# Patient Record
Sex: Female | Born: 1960 | Race: Black or African American | Hispanic: No | Marital: Single | Smoking: Never smoker
Health system: Southern US, Community
[De-identification: ages and names within clinical notes are randomized; demographics above are authoritative.]

## PROBLEM LIST (undated history)

## (undated) DIAGNOSIS — G4730 Sleep apnea, unspecified: Secondary | ICD-10-CM

## (undated) DIAGNOSIS — I10 Essential (primary) hypertension: Secondary | ICD-10-CM

## (undated) DIAGNOSIS — I739 Peripheral vascular disease, unspecified: Secondary | ICD-10-CM

## (undated) DIAGNOSIS — E785 Hyperlipidemia, unspecified: Secondary | ICD-10-CM

## (undated) DIAGNOSIS — T7840XA Allergy, unspecified, initial encounter: Secondary | ICD-10-CM

## (undated) DIAGNOSIS — I2510 Atherosclerotic heart disease of native coronary artery without angina pectoris: Secondary | ICD-10-CM

## (undated) DIAGNOSIS — H409 Unspecified glaucoma: Secondary | ICD-10-CM

## (undated) DIAGNOSIS — H269 Unspecified cataract: Secondary | ICD-10-CM

## (undated) DIAGNOSIS — E119 Type 2 diabetes mellitus without complications: Secondary | ICD-10-CM

## (undated) HISTORY — DX: Atherosclerotic heart disease of native coronary artery without angina pectoris: I25.10

## (undated) HISTORY — DX: Unspecified glaucoma: H40.9

## (undated) HISTORY — PX: CARDIAC CATHETERIZATION: SHX172

## (undated) HISTORY — DX: Hyperlipidemia, unspecified: E78.5

## (undated) HISTORY — DX: Allergy, unspecified, initial encounter: T78.40XA

## (undated) HISTORY — PX: CATARACT EXTRACTION: SUR2

## (undated) HISTORY — DX: Sleep apnea, unspecified: G47.30

## (undated) HISTORY — PX: HAND TENDON SURGERY: SHX663

## (undated) HISTORY — DX: Unspecified cataract: H26.9

## (undated) HISTORY — DX: Peripheral vascular disease, unspecified: I73.9

---

## 2014-02-21 ENCOUNTER — Encounter (HOSPITAL_COMMUNITY): Payer: Self-pay | Admitting: Emergency Medicine

## 2014-02-21 ENCOUNTER — Emergency Department (HOSPITAL_COMMUNITY)
Admission: EM | Admit: 2014-02-21 | Discharge: 2014-02-21 | Disposition: A | Discharge status: Home / Self Care | Payer: Federal, State, Local not specified - PPO | Source: Home / Self Care | Attending: Family Medicine | Admitting: Family Medicine

## 2014-02-21 DIAGNOSIS — J0120 Acute ethmoidal sinusitis, unspecified: Secondary | ICD-10-CM

## 2014-02-21 HISTORY — DX: Essential (primary) hypertension: I10

## 2014-02-21 HISTORY — DX: Type 2 diabetes mellitus without complications: E11.9

## 2014-02-21 MED ORDER — CLINDAMYCIN HCL 300 MG PO CAPS: 300.0000 mg | ORAL_CAPSULE | Freq: Three times a day (TID) | ORAL | Status: DC

## 2014-02-21 NOTE — Discharge Instructions (Signed)
Thank you for coming in today. °Call or go to the emergency room if you get worse, have trouble breathing, have chest pains, or palpitations.  ° °Sinusitis °Sinusitis is redness, soreness, and inflammation of the paranasal sinuses. Paranasal sinuses are air pockets within the bones of your face (beneath the eyes, the middle of the forehead, or above the eyes). In healthy paranasal sinuses, mucus is able to drain out, and air is able to circulate through them by way of your nose. However, when your paranasal sinuses are inflamed, mucus and air can become trapped. This can allow bacteria and other germs to grow and cause infection. °Sinusitis can develop quickly and last only a short time (acute) or continue over a long period (chronic). Sinusitis that lasts for more than 12 weeks is considered chronic.  °CAUSES  °Causes of sinusitis include: °· Allergies. °· Structural abnormalities, such as displacement of the cartilage that separates your nostrils (deviated septum), which can decrease the air flow through your nose and sinuses and affect sinus drainage. °· Functional abnormalities, such as when the small hairs (cilia) that line your sinuses and help remove mucus do not work properly or are not present. °SIGNS AND SYMPTOMS  °Symptoms of acute and chronic sinusitis are the same. The primary symptoms are pain and pressure around the affected sinuses. Other symptoms include: °· Upper toothache. °· Earache. °· Headache. °· Bad breath. °· Decreased sense of smell and taste. °· A cough, which worsens when you are lying flat. °· Fatigue. °· Fever. °· Thick drainage from your nose, which often is green and may contain pus (purulent). °· Swelling and warmth over the affected sinuses. °DIAGNOSIS  °Your health care provider will perform a physical exam. During the exam, your health care provider may: °· Look in your nose for signs of abnormal growths in your nostrils (nasal polyps). °· Tap over the affected sinus to check for  signs of infection. °· View the inside of your sinuses (endoscopy) using an imaging device that has a light attached (endoscope). °If your health care provider suspects that you have chronic sinusitis, one or more of the following tests may be recommended: °· Allergy tests. °· Nasal culture. A sample of mucus is taken from your nose, sent to a lab, and screened for bacteria. °· Nasal cytology. A sample of mucus is taken from your nose and examined by your health care provider to determine if your sinusitis is related to an allergy. °TREATMENT  °Most cases of acute sinusitis are related to a viral infection and will resolve on their own within 10 days. Sometimes medicines are prescribed to help relieve symptoms (pain medicine, decongestants, nasal steroid sprays, or saline sprays).  °However, for sinusitis related to a bacterial infection, your health care provider will prescribe antibiotic medicines. These are medicines that will help kill the bacteria causing the infection.  °Rarely, sinusitis is caused by a fungal infection. In theses cases, your health care provider will prescribe antifungal medicine. °For some cases of chronic sinusitis, surgery is needed. Generally, these are cases in which sinusitis recurs more than 3 times per year, despite other treatments. °HOME CARE INSTRUCTIONS  °· Drink plenty of water. Water helps thin the mucus so your sinuses can drain more easily. °· Use a humidifier. °· Inhale steam 3 to 4 times a day (for example, sit in the bathroom with the shower running). °· Apply a warm, moist washcloth to your face 3 to 4 times a day, or as directed by your   health care provider. °· Use saline nasal sprays to help moisten and clean your sinuses. °· Take medicines only as directed by your health care provider. °· If you were prescribed either an antibiotic or antifungal medicine, finish it all even if you start to feel better. °SEEK IMMEDIATE MEDICAL CARE IF: °· You have increasing pain or  severe headaches. °· You have nausea, vomiting, or drowsiness. °· You have swelling around your face. °· You have vision problems. °· You have a stiff neck. °· You have difficulty breathing. °MAKE SURE YOU:  °· Understand these instructions. °· Will watch your condition. °· Will get help right away if you are not doing well or get worse. °Document Released: 01/10/2005 Document Revised: 05/27/2013 Document Reviewed: 01/25/2011 °[employer]® Patient Information ©2015 [employer]. This information is not intended to replace advice given to you by your health care provider. Make sure you discuss any questions you have with your health care provider. ° °

## 2014-02-21 NOTE — ED Notes (Signed)
Pt reports pain to bridge of nose onset 10-12 days; reports it's very tender when she touches is it States it might be due to acid reflux that came out of her nose Denies fevers, chills Alert, no signs of acute distress.
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
## 2014-02-22 ENCOUNTER — Telehealth (HOSPITAL_COMMUNITY): Payer: Self-pay | Admitting: *Deleted

## 2014-02-22 NOTE — ED Notes (Signed)
Reports breaking out in rash; no throat or oral swelling or diff breathing.  Instructed to immediately stop clindamycin; may take OTC Benadryl if needed.  Discussed with Dr. Lorenz CoasterKeller.  Rx for doxycycline 100mg  one PO BID, #20, no refills, called in to [facility]([address]) per pt request & Dr. Lorenz CoasterKeller orders.  Msg left for pharmacist.  Instructed pt to always list clindamycin as an allergy.  Pt verbalized understanding.

## 2014-06-25 DIAGNOSIS — E119 Type 2 diabetes mellitus without complications: Secondary | ICD-10-CM | POA: Insufficient documentation

## 2014-06-25 DIAGNOSIS — E6609 Other obesity due to excess calories: Secondary | ICD-10-CM | POA: Insufficient documentation

## 2014-06-25 DIAGNOSIS — E785 Hyperlipidemia, unspecified: Secondary | ICD-10-CM | POA: Insufficient documentation

## 2016-01-01 ENCOUNTER — Emergency Department (HOSPITAL_COMMUNITY): Payer: Federal, State, Local not specified - PPO

## 2016-01-01 ENCOUNTER — Encounter (HOSPITAL_COMMUNITY): Payer: Self-pay

## 2016-01-01 ENCOUNTER — Emergency Department (HOSPITAL_COMMUNITY)
Admission: EM | Admit: 2016-01-01 | Discharge: 2016-01-02 | Disposition: A | Discharge status: Home / Self Care | Payer: Federal, State, Local not specified - PPO | Attending: Emergency Medicine | Admitting: Emergency Medicine

## 2016-01-01 DIAGNOSIS — Y999 Unspecified external cause status: Secondary | ICD-10-CM | POA: Insufficient documentation

## 2016-01-01 DIAGNOSIS — Y92410 Unspecified street and highway as the place of occurrence of the external cause: Secondary | ICD-10-CM | POA: Diagnosis not present

## 2016-01-01 DIAGNOSIS — I10 Essential (primary) hypertension: Secondary | ICD-10-CM | POA: Insufficient documentation

## 2016-01-01 DIAGNOSIS — Z794 Long term (current) use of insulin: Secondary | ICD-10-CM | POA: Insufficient documentation

## 2016-01-01 DIAGNOSIS — S62356A Nondisplaced fracture of shaft of fifth metacarpal bone, right hand, initial encounter for closed fracture: Secondary | ICD-10-CM

## 2016-01-01 DIAGNOSIS — R109 Unspecified abdominal pain: Secondary | ICD-10-CM | POA: Diagnosis not present

## 2016-01-01 DIAGNOSIS — S6991XA Unspecified injury of right wrist, hand and finger(s), initial encounter: Secondary | ICD-10-CM | POA: Diagnosis present

## 2016-01-01 DIAGNOSIS — M25562 Pain in left knee: Secondary | ICD-10-CM | POA: Insufficient documentation

## 2016-01-01 DIAGNOSIS — Y939 Activity, unspecified: Secondary | ICD-10-CM | POA: Diagnosis not present

## 2016-01-01 DIAGNOSIS — E119 Type 2 diabetes mellitus without complications: Secondary | ICD-10-CM | POA: Insufficient documentation

## 2016-01-01 DIAGNOSIS — S82892A Other fracture of left lower leg, initial encounter for closed fracture: Secondary | ICD-10-CM

## 2016-01-01 DIAGNOSIS — S92252A Displaced fracture of navicular [scaphoid] of left foot, initial encounter for closed fracture: Secondary | ICD-10-CM | POA: Diagnosis not present

## 2016-01-01 LAB — I-STAT CREATININE, ED: Creatinine, Ser: 0.6 mg/dL (ref 0.44–1.00)

## 2016-01-01 MED ORDER — IOPAMIDOL (ISOVUE-300) INJECTION 61%
INTRAVENOUS | Status: AC
Start: 2016-01-01 — End: 2016-01-01
  Administered 2016-01-01: 100 mL
  Filled 2016-01-01: qty 100
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
## 2016-01-01 NOTE — ED Notes (Signed)
rep[facility] to Law @[id number] 
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
## 2016-01-01 NOTE — ED Notes (Signed)
Pt changing in gown at this time.  

## 2016-01-02 LAB — CBG MONITORING, ED: Glucose-Capillary: 180 mg/dL — ABNORMAL HIGH (ref 65–99)

## 2016-01-02 MED ORDER — CYCLOBENZAPRINE HCL 10 MG PO TABS: 10.0000 mg | ORAL_TABLET | Freq: Two times a day (BID) | ORAL | 0 refills | Status: DC | PRN

## 2016-01-02 MED ORDER — HYDROCODONE-ACETAMINOPHEN 5-325 MG PO TABS: 1.0000 | ORAL_TABLET | Freq: Four times a day (QID) | ORAL | 0 refills | Status: DC | PRN
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
## 2016-01-02 NOTE — Progress Notes (Signed)
Orthopedic Tech Progress Note Patient Details:  Edmonia LynchCrystal Varghese 12/10/1960 [id number]  Ortho Devices Type of Ortho Device: CAM walker, Volar splint Ortho Device/Splint Location: rue, lle Ortho Device/Splint Interventions: Ordered, Application   Trinna PostMartinez, Riyanna Crutchley J 01/02/2016, 12:36 AM

## 2016-01-05 ENCOUNTER — Ambulatory Visit (INDEPENDENT_AMBULATORY_CARE_PROVIDER_SITE_OTHER): Payer: Federal, State, Local not specified - PPO | Admitting: Orthopaedic Surgery

## 2016-01-05 ENCOUNTER — Encounter (INDEPENDENT_AMBULATORY_CARE_PROVIDER_SITE_OTHER): Payer: Self-pay | Admitting: Orthopaedic Surgery

## 2016-01-05 DIAGNOSIS — S62336A Displaced fracture of neck of fifth metacarpal bone, right hand, initial encounter for closed fracture: Secondary | ICD-10-CM | POA: Diagnosis not present

## 2016-01-05 DIAGNOSIS — M25572 Pain in left ankle and joints of left foot: Secondary | ICD-10-CM | POA: Insufficient documentation
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
## 2016-02-02 ENCOUNTER — Encounter (INDEPENDENT_AMBULATORY_CARE_PROVIDER_SITE_OTHER): Payer: Self-pay

## 2016-02-02 ENCOUNTER — Ambulatory Visit (INDEPENDENT_AMBULATORY_CARE_PROVIDER_SITE_OTHER): Payer: Federal, State, Local not specified - PPO | Admitting: Orthopaedic Surgery

## 2016-02-02 ENCOUNTER — Ambulatory Visit (INDEPENDENT_AMBULATORY_CARE_PROVIDER_SITE_OTHER): Payer: Federal, State, Local not specified - PPO

## 2016-02-02 ENCOUNTER — Encounter (INDEPENDENT_AMBULATORY_CARE_PROVIDER_SITE_OTHER): Payer: Self-pay | Admitting: Orthopaedic Surgery

## 2016-02-02 DIAGNOSIS — M79641 Pain in right hand: Secondary | ICD-10-CM
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
## 2016-03-01 ENCOUNTER — Ambulatory Visit (INDEPENDENT_AMBULATORY_CARE_PROVIDER_SITE_OTHER): Payer: Federal, State, Local not specified - PPO

## 2016-03-01 ENCOUNTER — Encounter (INDEPENDENT_AMBULATORY_CARE_PROVIDER_SITE_OTHER): Payer: Self-pay

## 2016-03-01 ENCOUNTER — Ambulatory Visit (INDEPENDENT_AMBULATORY_CARE_PROVIDER_SITE_OTHER): Payer: Federal, State, Local not specified - PPO | Admitting: Orthopaedic Surgery

## 2016-03-01 DIAGNOSIS — S62336D Displaced fracture of neck of fifth metacarpal bone, right hand, subsequent encounter for fracture with routine healing: Secondary | ICD-10-CM | POA: Diagnosis not present
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
## 2016-04-11 ENCOUNTER — Ambulatory Visit (INDEPENDENT_AMBULATORY_CARE_PROVIDER_SITE_OTHER): Payer: Federal, State, Local not specified - PPO | Admitting: Orthopaedic Surgery

## 2016-04-18 ENCOUNTER — Encounter (INDEPENDENT_AMBULATORY_CARE_PROVIDER_SITE_OTHER): Payer: Self-pay | Admitting: Orthopaedic Surgery

## 2016-04-18 ENCOUNTER — Ambulatory Visit (INDEPENDENT_AMBULATORY_CARE_PROVIDER_SITE_OTHER): Payer: Federal, State, Local not specified - PPO | Admitting: Orthopaedic Surgery

## 2016-04-18 ENCOUNTER — Ambulatory Visit (INDEPENDENT_AMBULATORY_CARE_PROVIDER_SITE_OTHER): Payer: Federal, State, Local not specified - PPO

## 2016-04-18 DIAGNOSIS — S62336D Displaced fracture of neck of fifth metacarpal bone, right hand, subsequent encounter for fracture with routine healing: Secondary | ICD-10-CM
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
## 2016-07-04 ENCOUNTER — Telehealth (INDEPENDENT_AMBULATORY_CARE_PROVIDER_SITE_OTHER): Payer: Self-pay | Admitting: Orthopaedic Surgery

## 2016-07-04 NOTE — Telephone Encounter (Signed)
[facility]

## 2016-09-08 ENCOUNTER — Telehealth (INDEPENDENT_AMBULATORY_CARE_PROVIDER_SITE_OTHER): Payer: Self-pay | Admitting: Orthopaedic Surgery

## 2016-09-08 NOTE — Telephone Encounter (Signed)
Monica Daniels @ [facility] called checking on request for records. I faxed originally faxed 07/04/2016. I refaxed [phone]

## 2017-06-29 DIAGNOSIS — E119 Type 2 diabetes mellitus without complications: Secondary | ICD-10-CM | POA: Diagnosis not present

## 2017-07-21 DIAGNOSIS — H25813 Combined forms of age-related cataract, bilateral: Secondary | ICD-10-CM | POA: Diagnosis not present

## 2017-07-21 DIAGNOSIS — H401131 Primary open-angle glaucoma, bilateral, mild stage: Secondary | ICD-10-CM | POA: Diagnosis not present

## 2017-10-20 DIAGNOSIS — E1165 Type 2 diabetes mellitus with hyperglycemia: Secondary | ICD-10-CM | POA: Diagnosis not present

## 2017-10-20 DIAGNOSIS — E559 Vitamin D deficiency, unspecified: Secondary | ICD-10-CM | POA: Diagnosis not present

## 2017-10-20 DIAGNOSIS — Z13220 Encounter for screening for lipoid disorders: Secondary | ICD-10-CM | POA: Diagnosis not present

## 2017-10-20 DIAGNOSIS — I10 Essential (primary) hypertension: Secondary | ICD-10-CM | POA: Diagnosis not present

## 2017-10-20 DIAGNOSIS — Z0000 Encounter for general adult medical examination without abnormal findings: Secondary | ICD-10-CM | POA: Diagnosis not present

## 2017-10-26 DIAGNOSIS — M2011 Hallux valgus (acquired), right foot: Secondary | ICD-10-CM | POA: Diagnosis not present

## 2017-10-26 DIAGNOSIS — E119 Type 2 diabetes mellitus without complications: Secondary | ICD-10-CM | POA: Diagnosis not present

## 2017-10-26 DIAGNOSIS — M2012 Hallux valgus (acquired), left foot: Secondary | ICD-10-CM | POA: Diagnosis not present

## 2018-01-22 DIAGNOSIS — Z9109 Other allergy status, other than to drugs and biological substances: Secondary | ICD-10-CM | POA: Diagnosis not present

## 2018-01-22 DIAGNOSIS — E1165 Type 2 diabetes mellitus with hyperglycemia: Secondary | ICD-10-CM | POA: Diagnosis not present

## 2018-01-22 DIAGNOSIS — I10 Essential (primary) hypertension: Secondary | ICD-10-CM | POA: Diagnosis not present

## 2018-01-22 DIAGNOSIS — G4733 Obstructive sleep apnea (adult) (pediatric): Secondary | ICD-10-CM | POA: Diagnosis not present

## 2018-03-02 DIAGNOSIS — G4733 Obstructive sleep apnea (adult) (pediatric): Secondary | ICD-10-CM | POA: Diagnosis not present

## 2018-03-22 DIAGNOSIS — E119 Type 2 diabetes mellitus without complications: Secondary | ICD-10-CM | POA: Diagnosis not present

## 2018-03-22 DIAGNOSIS — Z794 Long term (current) use of insulin: Secondary | ICD-10-CM | POA: Diagnosis not present

## 2018-03-22 DIAGNOSIS — E6609 Other obesity due to excess calories: Secondary | ICD-10-CM | POA: Diagnosis not present

## 2018-08-31 DIAGNOSIS — G4733 Obstructive sleep apnea (adult) (pediatric): Secondary | ICD-10-CM | POA: Diagnosis not present

## 2018-09-20 DIAGNOSIS — Z7984 Long term (current) use of oral hypoglycemic drugs: Secondary | ICD-10-CM | POA: Diagnosis not present

## 2018-09-20 DIAGNOSIS — Z5181 Encounter for therapeutic drug level monitoring: Secondary | ICD-10-CM | POA: Diagnosis not present

## 2018-09-20 DIAGNOSIS — E669 Obesity, unspecified: Secondary | ICD-10-CM | POA: Diagnosis not present

## 2018-09-20 DIAGNOSIS — E1165 Type 2 diabetes mellitus with hyperglycemia: Secondary | ICD-10-CM | POA: Diagnosis not present

## 2018-09-20 DIAGNOSIS — E6609 Other obesity due to excess calories: Secondary | ICD-10-CM | POA: Diagnosis not present

## 2018-09-20 DIAGNOSIS — Z794 Long term (current) use of insulin: Secondary | ICD-10-CM | POA: Diagnosis not present

## 2018-09-20 DIAGNOSIS — E119 Type 2 diabetes mellitus without complications: Secondary | ICD-10-CM | POA: Diagnosis not present

## 2018-09-20 DIAGNOSIS — Z6841 Body Mass Index (BMI) 40.0 and over, adult: Secondary | ICD-10-CM | POA: Diagnosis not present

## 2018-10-26 DIAGNOSIS — E119 Type 2 diabetes mellitus without complications: Secondary | ICD-10-CM | POA: Diagnosis not present

## 2018-10-26 DIAGNOSIS — M2012 Hallux valgus (acquired), left foot: Secondary | ICD-10-CM | POA: Diagnosis not present

## 2018-10-26 DIAGNOSIS — M2011 Hallux valgus (acquired), right foot: Secondary | ICD-10-CM | POA: Diagnosis not present

## 2018-11-23 DIAGNOSIS — E1142 Type 2 diabetes mellitus with diabetic polyneuropathy: Secondary | ICD-10-CM | POA: Diagnosis not present

## 2018-11-23 DIAGNOSIS — I10 Essential (primary) hypertension: Secondary | ICD-10-CM | POA: Diagnosis not present

## 2018-11-23 DIAGNOSIS — Z1159 Encounter for screening for other viral diseases: Secondary | ICD-10-CM | POA: Diagnosis not present

## 2018-11-23 DIAGNOSIS — Z794 Long term (current) use of insulin: Secondary | ICD-10-CM | POA: Diagnosis not present

## 2018-11-23 DIAGNOSIS — E7800 Pure hypercholesterolemia, unspecified: Secondary | ICD-10-CM | POA: Diagnosis not present

## 2018-11-23 DIAGNOSIS — Z6841 Body Mass Index (BMI) 40.0 and over, adult: Secondary | ICD-10-CM | POA: Diagnosis not present

## 2019-02-18 ENCOUNTER — Other Ambulatory Visit: Payer: Self-pay | Admitting: Family Medicine

## 2019-02-18 ENCOUNTER — Other Ambulatory Visit (HOSPITAL_COMMUNITY)
Admission: RE | Admit: 2019-02-18 | Discharge: 2019-02-18 | Disposition: A | Discharge status: Home / Self Care | Payer: Federal, State, Local not specified - PPO | Source: Ambulatory Visit | Attending: Family Medicine | Admitting: Family Medicine

## 2019-02-18 DIAGNOSIS — Z01411 Encounter for gynecological examination (general) (routine) with abnormal findings: Secondary | ICD-10-CM | POA: Insufficient documentation

## 2019-02-18 DIAGNOSIS — I10 Essential (primary) hypertension: Secondary | ICD-10-CM | POA: Diagnosis not present

## 2019-02-18 DIAGNOSIS — E1142 Type 2 diabetes mellitus with diabetic polyneuropathy: Secondary | ICD-10-CM | POA: Diagnosis not present

## 2019-02-18 DIAGNOSIS — Z23 Encounter for immunization: Secondary | ICD-10-CM | POA: Diagnosis not present

## 2019-02-18 DIAGNOSIS — Z0000 Encounter for general adult medical examination without abnormal findings: Secondary | ICD-10-CM | POA: Diagnosis not present

## 2019-02-21 LAB — CYTOLOGY - PAP
Comment: NEGATIVE
Diagnosis: NEGATIVE
High risk HPV: NEGATIVE

## 2019-05-28 ENCOUNTER — Other Ambulatory Visit: Payer: Self-pay

## 2019-05-28 ENCOUNTER — Ambulatory Visit (INDEPENDENT_AMBULATORY_CARE_PROVIDER_SITE_OTHER): Payer: Federal, State, Local not specified - PPO

## 2019-05-28 ENCOUNTER — Encounter: Payer: Self-pay | Admitting: Podiatry

## 2019-05-28 ENCOUNTER — Ambulatory Visit: Payer: Federal, State, Local not specified - PPO | Admitting: Podiatry

## 2019-05-28 ENCOUNTER — Telehealth: Payer: Self-pay | Admitting: *Deleted

## 2019-05-28 VITALS — BP 125/59 | HR 76 | Resp 16

## 2019-05-28 DIAGNOSIS — I739 Peripheral vascular disease, unspecified: Secondary | ICD-10-CM | POA: Diagnosis not present

## 2019-05-28 DIAGNOSIS — L97511 Non-pressure chronic ulcer of other part of right foot limited to breakdown of skin: Secondary | ICD-10-CM

## 2019-05-28 MED ORDER — MUPIROCIN 2 % EX OINT: TOPICAL_OINTMENT | CUTANEOUS | 1 refills | Status: DC

## 2019-05-28 NOTE — Progress Notes (Signed)
Subjective:  Patient ID: Monica Daniels, female    DOB: 1961/01/24,  MRN: [id number] HPI Chief Complaint  Patient presents with  . Foot Pain    Sub 1st MPJ right - dark, callused area x 1 month, tries filing down, won't heal  . Diabetes    Unknow a1c, but glucose this AM was 110s  . New Patient (Initial Visit)    59 y.o. female presents with the above complaint.   ROS: Denies fever chills nausea vomiting muscle aches pains calf pain back pain chest pain shortness of breath.  States that her last hemoglobin A1c was 8.0 or thereabouts she says.  Past Medical History:  Diagnosis Date  . Diabetes mellitus without complication (HCC)   . Hypertension    Past Surgical History:  Procedure Laterality Date  . HAND TENDON SURGERY      Current Outpatient Medications:  .  loratadine (CLARITIN) 10 MG tablet, Take 10 mg by mouth daily., Disp: , Rfl:  .  vitamin B-12 (CYANOCOBALAMIN) 500 MCG tablet, Take 500 mcg by mouth daily., Disp: , Rfl:  .  atorvastatin (LIPITOR) 10 MG tablet, Take 10 mg by mouth daily., Disp: , Rfl:  .  B-D UF III MINI PEN NEEDLES 31G X 5 MM MISC, USE TO INJECT LANTUS DAILY AS DIRECTED, Disp: , Rfl:  .  FARXIGA 10 MG TABS tablet, Take 10 mg by mouth daily., Disp: , Rfl:  .  insulin glargine (LANTUS) 100 UNIT/ML injection, Inject 13 Units into the skin at bedtime. , Disp: , Rfl:  .  latanoprost (XALATAN) 0.005 % ophthalmic solution, , Disp: , Rfl:  .  lisinopril (ZESTRIL) 10 MG tablet, Take 10 mg by mouth daily., Disp: , Rfl:  .  metFORMIN (GLUCOPHAGE-XR) 500 MG 24 hr tablet, TAKE 2 TABLETS BY MOUTH TWICE A DAY WITH MEALS, Disp: , Rfl:  .  Multiple Vitamin (MULTIVITAMIN WITH MINERALS) TABS tablet, Take 1 tablet by mouth daily., Disp: , Rfl:  .  mupirocin ointment (BACTROBAN) 2 %, Apply to wound after soaking BID, Disp: 30 g, Rfl: 1  Allergies  Allergen Reactions  . Penicillins     Swelling Has patient had a PCN reaction causing immediate rash, facial/tongue/throat  swelling, SOB or lightheadedness with hypotension: YES Has patient had a PCN reaction causing severe rash involving mucus membranes or skin necrosis: NO Has patient had a PCN reaction that required hospitalization NO Has patient had a PCN reaction occurring within the last 10 years: NO If all of the above answers are "NO", then may proceed with Cephalosporin use.  . Clindamycin/Lincomycin Rash   Review of Systems Objective:   Vitals:   05/28/19 1033  BP: (!) 125/59  Pulse: 76  Resp: 16    General: Well developed, nourished, in no acute distress, alert and oriented x3   Dermatological: Skin is warm, dry and supple bilateral. Nails x 10 are well maintained; remaining integument appears unremarkable at this time. There are no open sores, no preulcerative lesions, no rash or signs of infection present.  Hyperkeratotic lesion was debrided demonstrates an ulceration measuring about 4 mm in width and does not probe deep at all.  Is easily believable no signs of purulence no malodor.  Vascular: Dorsalis Pedis artery  artery pedal pulses are 1/4 in the posterior tibial artery is nonpalpable.  Bilateral with immedate capillary fill time. Pedal hair growth present. No varicosities and no lower extremity edema present bilateral.   Neruologic: Grossly intact via light touch bilateral. Vibratory intact via  tuning fork bilateral. Protective threshold with Semmes Wienstein monofilament intact to all pedal sites bilateral. Patellar and Achilles deep tendon reflexes 2+ bilateral. No Babinski or clonus noted bilateral.   Musculoskeletal: No gross boney pedal deformities bilateral. No pain, crepitus, or limitation noted with foot and ankle range of motion bilateral. Muscular strength 5/5 in all groups tested bilateral.  Hallux abductovalgus deformity of the bilateral foot right seems to be worse than the left prominent palpable bony mass plantar medial aspect of the base of the proximal phalanx.  Gait:  Unassisted, Nonantalgic.    Radiographs:  Radiographs taken today demonstrate an osseously mature individual oblique view does demonstrate some hypertrophic bone growth along the base of the proximal phalanx just beneath the plantar medial ulceration.  The skin is thickened in that area.  There is no sign of bony breakdown.  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  Also placed her in a Darco shoe.  We will send her for ABIs and I will follow-up with her in a few weeks     Monica Daniels, [place]

## 2019-05-28 NOTE — Telephone Encounter (Signed)
-----   Message from Kristian Covey, [facility] sent at 05/28/2019 10:53 AM EDT ----- Regarding: Vascular Vascular studies - ulcer sub 1st MPJ right - PVD with claudication - needs ABI's

## 2019-05-28 NOTE — Telephone Encounter (Signed)
Orders to [facility]. 

## 2019-06-06 ENCOUNTER — Ambulatory Visit (HOSPITAL_COMMUNITY)
Admission: RE | Admit: 2019-06-06 | Discharge: 2019-06-06 | Disposition: A | Discharge status: Home / Self Care | Payer: Federal, State, Local not specified - PPO | Source: Ambulatory Visit | Attending: Cardiology | Admitting: Cardiology

## 2019-06-06 ENCOUNTER — Other Ambulatory Visit: Payer: Self-pay

## 2019-06-06 DIAGNOSIS — L97511 Non-pressure chronic ulcer of other part of right foot limited to breakdown of skin: Secondary | ICD-10-CM | POA: Diagnosis not present

## 2019-06-06 DIAGNOSIS — I739 Peripheral vascular disease, unspecified: Secondary | ICD-10-CM | POA: Insufficient documentation

## 2019-06-07 ENCOUNTER — Ambulatory Visit: Payer: Federal, State, Local not specified - PPO | Admitting: Cardiovascular Disease

## 2019-06-07 ENCOUNTER — Encounter: Payer: Self-pay | Admitting: Cardiovascular Disease

## 2019-06-07 VITALS — BP 122/78 | HR 82 | Temp 97.2°F | Ht 66.0 in | Wt 264.0 lb

## 2019-06-07 DIAGNOSIS — E782 Mixed hyperlipidemia: Secondary | ICD-10-CM

## 2019-06-07 DIAGNOSIS — I10 Essential (primary) hypertension: Secondary | ICD-10-CM | POA: Diagnosis not present

## 2019-06-07 DIAGNOSIS — I739 Peripheral vascular disease, unspecified: Secondary | ICD-10-CM | POA: Insufficient documentation

## 2019-06-07 DIAGNOSIS — R0989 Other specified symptoms and signs involving the circulatory and respiratory systems: Secondary | ICD-10-CM

## 2019-06-07 DIAGNOSIS — R072 Precordial pain: Secondary | ICD-10-CM

## 2019-06-07 MED ORDER — ATORVASTATIN CALCIUM 40 MG PO TABS: 40.0000 mg | ORAL_TABLET | Freq: Every day | ORAL | 3 refills | Status: DC

## 2019-06-07 NOTE — Patient Instructions (Addendum)
Medication Instructions:   INCREASE ATORVASTATIN TO 40 MG ONCE DAILY= 4 OF THE 10 MG TABLETS ONCE DAILY  *If you need a refill on your cardiac medications before your next appointment, please call your pharmacy*   Lab Work: Your physician recommends that you return for lab work in: 2 MONTHS PRIOR TO EATING  If you have labs (blood work) drawn today and your tests are completely normal, you will receive your results only by: [name] MyChart Message (if you have MyChart) OR . A paper copy in the mail If you have any lab test that is abnormal or we need to change your treatment, we will call you to review the results.   Testing/Procedures: Your physician has requested that you have a carotid duplex. This test is an ultrasound of the carotid arteries in your neck. It looks at blood flow through these arteries that supply the brain with blood. Allow one hour for this exam. There are no restrictions or special instructions.[facility]  CT CALCIUM SCORING AT [address]   Follow-Up: At [facility], you and your health needs are our priority.  As part of our continuing mission to provide you with exceptional heart care, we have created designated Provider Care Teams.  These Care Teams include your primary Cardiologist (physician) and Advanced Practice Providers (APPs -  Physician Assistants and Nurse Practitioners) who all work together to provide you with the care you need, when you need it.  We recommend signing up for the patient portal called "MyChart".  Sign up information is provided on this After Visit Summary.  MyChart is used to connect with patients for Virtual Visits (Telemedicine).  Patients are able to view lab/test results, encounter notes, upcoming appointments, etc.  Non-urgent messages can be sent to your provider as well.   To learn more about what you can do with MyChart, go to [email or web address].    Your next appointment:   2 month(s)  The format for your  next appointment:   In Person  Provider:   [name], MD

## 2019-06-07 NOTE — Assessment & Plan Note (Signed)
Monica Daniels was referred to me by Dr. Ernestene Kiel, her podiatrist, for small ulcer on the head of her first metatarsal on the right.  She does have a history of treated hypertension, hyperlipidemia and diabetes.  She thinks the ulcer was related to pressure from her shoe.  It is healing.  She does not exercise much but denies significant claudication.  Dopplers performed yesterday revealed a right ABI of 0.75 and a left of 0.82.  She did have a high-frequency signal in her distal right SFA with tibial disease bilaterally although she really denies claudication.  At this point, given the fact that her ulcer is healing, we will hold off on performing peripheral intervention.  I will see her back in 2 months for further evaluation.

## 2019-06-07 NOTE — Assessment & Plan Note (Signed)
Ms. Frederic has a left carotid bruit on exam today.  We will get carotid Doppler studies to further evaluate.

## 2019-06-07 NOTE — Progress Notes (Signed)
06/07/2019 Monica Daniels   February 18, 1960  [id number]  Primary Physician Monica Smoker, MD Primary Cardiologist: Monica Harp MD Monica Daniels, [place]  HPI:  Monica Daniels is a 59 y.o. 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  She is referred by her podiatrist, Dr. Tyson Daniels, for peripheral vascular valuation because of an ulcer on her right first metatarsal head.  Her cardiac risk factors are notable for treated hypertension, diabetes and hyperlipidemia.  Unfortunately her hemoglobin A1c's have remained above 8.  She does have a family history for heart disease in his mother who had CABG.  Her mother, Monica Daniels, was a patient of mine as well who died in Apr 24, 2018.  She is never had a heart attack or stroke.  She denies chest pain or shortness of breath, but does sometimes have burning in her chest which she is attributed to reflux.  She does not exercise much but denies claudication as well.  Current Meds  Medication Sig  . atorvastatin (LIPITOR) 40 MG tablet Take 1 tablet (40 mg total) by mouth daily.  . B-D UF III MINI PEN NEEDLES 31G X 5 MM MISC USE TO INJECT LANTUS DAILY AS DIRECTED  . FARXIGA 10 MG TABS tablet Take 10 mg by mouth daily.  . insulin glargine (LANTUS) 100 UNIT/ML injection Inject 13 Units into the skin at bedtime.   [name] latanoprost (XALATAN) 0.005 % ophthalmic solution   . lisinopril (ZESTRIL) 10 MG tablet Take 10 mg by mouth daily.  [name] loratadine (CLARITIN) 10 MG tablet Take 10 mg by mouth daily.  . metFORMIN (GLUCOPHAGE-XR) 500 MG 24 hr tablet TAKE 2 TABLETS BY MOUTH TWICE A DAY WITH MEALS  . Multiple Vitamin (MULTIVITAMIN WITH MINERALS) TABS tablet Take 1 tablet by mouth daily.  . mupirocin ointment (BACTROBAN) 2 % Apply to wound after soaking BID  . vitamin B-12 (CYANOCOBALAMIN) 500 MCG tablet Take 500 mcg by mouth daily.  . [DISCONTINUED] atorvastatin  (LIPITOR) 10 MG tablet Take 10 mg by mouth daily.     Allergies  Allergen Reactions  . Penicillins     Swelling Has patient had a PCN reaction causing immediate rash, facial/tongue/throat swelling, SOB or lightheadedness with hypotension: YES Has patient had a PCN reaction causing severe rash involving mucus membranes or skin necrosis: NO Has patient had a PCN reaction that required hospitalization NO Has patient had a PCN reaction occurring within the last 10 years: NO If all of the above answers are "NO", then may proceed with Cephalosporin use.  . Clindamycin/Lincomycin Rash    Social History   Socioeconomic History  . Marital status: Single    Spouse name: Not on file  . Number of children: Not on file  . Years of education: Not on file  . Highest education level: Not on file  Occupational History  . Not on file  Tobacco Use  . Smoking status: Never Daniels  . Smokeless tobacco: Never Used  Substance and Sexual Activity  . Alcohol use: No  . Drug use: No  . Sexual activity: Not on file  Other Topics Concern  . Not on file  Social History Narrative  . Not on file   Social Determinants of Health   Financial Resource Strain:   . Difficulty of Paying Living Expenses:   Food Insecurity:   . Worried About Charity fundraiser in the Last Year:   .  Ran Out of Food in the Last Year:   Transportation Needs:   . Freight forwarder (Medical):   [name] Lack of Transportation (Non-Medical):   Physical Activity:   . Days of Exercise per Week:   . Minutes of Exercise per Session:   Stress:   . Feeling of Stress :   Social Connections:   . Frequency of Communication with Friends and Family:   . Frequency of Social Gatherings with Friends and Family:   . Attends Religious Services:   . Active Member of Clubs or Organizations:   . Attends Banker Meetings:   [name] Marital Status:   Intimate Partner Violence:   . Fear of Current or Ex-Partner:   . Emotionally  Abused:   [name] Physically Abused:   . Sexually Abused:      Review of Systems: General: negative for chills, fever, night sweats or weight changes.  Cardiovascular: negative for chest pain, dyspnea on exertion, edema, orthopnea, palpitations, paroxysmal nocturnal dyspnea or shortness of breath Dermatological: negative for rash Respiratory: negative for cough or wheezing Urologic: negative for hematuria Abdominal: negative for nausea, vomiting, diarrhea, bright red blood per rectum, melena, or hematemesis Neurologic: negative for visual changes, syncope, or dizziness All other systems reviewed and are otherwise negative except as noted above.    Blood pressure 122/78, pulse 82, temperature (!) 97.2 F (36.2 C), height 5\' 6"  (1.676 m), weight 264 lb (119.7 kg), last menstrual period 12/31/2012.  General appearance: alert and no distress Neck: no adenopathy, no JVD, supple, symmetrical, trachea midline, thyroid not enlarged, symmetric, no tenderness/mass/nodules and Screening left carotid bruit Lungs: clear to auscultation bilaterally Heart: regular rate and rhythm, S1, S2 normal, no murmur, click, rub or gallop Extremities: extremities normal, atraumatic, no cyanosis or edema Pulses: Diminished pedal pulses Skin: Small healing ulcer dorsal aspect of first metatarsal head on the right Neurologic: Alert and oriented X 3, normal strength and tone. Normal symmetric reflexes. Normal coordination and gait  EKG normal sinus rhythm 82 with inferolateral T wave inversion.  I personally reviewed this EKG.  ASSESSMENT AND PLAN:   HLD (hyperlipidemia) History of hyperlipidemia on low-dose atorvastatin with lipid profile performed 11/23/2018 revealing total cholesterol 259, LDL 175 and HDL 57.  I am going to increase her atorvastatin from 10 to 40 mg a day and we will recheck a lipid liver profile in 2 months  Essential hypertension History of essential potential blood pressure measured today  122/78.  She is on lisinopril.  Peripheral arterial disease [facility]) Monica Daniels was referred to me by Dr. Maurice Daniels, her podiatrist, for small ulcer on the head of her first metatarsal on the right.  She does have a history of treated hypertension, hyperlipidemia and diabetes.  She thinks the ulcer was related to pressure from her shoe.  It is healing.  She does not exercise much but denies significant claudication.  Dopplers performed yesterday revealed a right ABI of 0.75 and a left of 0.82.  She did have a high-frequency signal in her distal right SFA with tibial disease bilaterally although she really denies claudication.  At this point, given the fact that her ulcer is healing, we will hold off on performing peripheral intervention.  I will see her back in 2 months for further evaluation.  Left carotid bruit Ms. Freeze has a left carotid bruit on exam today.  We will get carotid Doppler studies to further evaluate.      Monica March MD FACP,FACC,FAHA, [facility] 06/07/2019  9:41 AM

## 2019-06-07 NOTE — Assessment & Plan Note (Signed)
History of essential potential blood pressure measured today 122/78.  She is on lisinopril.

## 2019-06-07 NOTE — Assessment & Plan Note (Signed)
History of hyperlipidemia on low-dose atorvastatin with lipid profile performed 11/23/2018 revealing total cholesterol 259, LDL 175 and HDL 57.  I am going to increase her atorvastatin from 10 to 40 mg a day and we will recheck a lipid liver profile in 2 months

## 2019-06-07 NOTE — Progress Notes (Signed)
CARDIACvas

## 2019-06-11 ENCOUNTER — Encounter: Payer: Self-pay | Admitting: Podiatry

## 2019-06-11 ENCOUNTER — Other Ambulatory Visit: Payer: Self-pay

## 2019-06-11 ENCOUNTER — Ambulatory Visit: Payer: Federal, State, Local not specified - PPO | Admitting: Podiatry

## 2019-06-11 VITALS — Temp 98.2°F

## 2019-06-11 DIAGNOSIS — L97511 Non-pressure chronic ulcer of other part of right foot limited to breakdown of skin: Secondary | ICD-10-CM

## 2019-06-11 DIAGNOSIS — I739 Peripheral vascular disease, unspecified: Secondary | ICD-10-CM

## 2019-06-11 NOTE — Progress Notes (Signed)
She presents today for follow-up of ulceration subfirst metatarsophalangeal joint of the right foot.  She denies drainage or bleeding.  She states that is still a bit tender but continues to dress it daily and apply Bactroban ointment.  She continues to wear the Darco shoe.  She saw Dr. Donnella Bi who found peripheral vascular disease right side greater than that of the left ABI right is a 0.75.  Objective: Presents with her Darco shoe and Band-Aid today was removed demonstrates mildly reactive hyperkeratotic area to the plantar medial aspect of the first metatarsophalangeal joint of the right foot.  Was debrided demonstrates an ulceration that measures about 3 mm to the plantar aspect of the foot.  I debrided this and now measures approximately 4 mm.  There is some bleeding and has a beefy red tissue on the bottom of the does not probe.  Vascular studies demonstrate tibial obstruction right over left with decreased ABIs right.  Also demonstrates a bruit in her left carotid  Assessment: Slowly resolving ulceration subforefoot first met head right.  Peripheral vascular disease.  Plan: I recommended she continue conservative therapies with this.  She will continue to dress the wound daily I will see her in about 3 weeks.  She asked whether or not we will do surgery on this.  At this point I will reiterated to her that as long as we can get this healed and create some type of insert for her shoe to help offload this area we would rather stay away from surgery.  She understands this and is amenable to it.

## 2019-06-13 ENCOUNTER — Ambulatory Visit (HOSPITAL_COMMUNITY)
Admission: RE | Admit: 2019-06-13 | Discharge: 2019-06-13 | Disposition: A | Discharge status: Home / Self Care | Payer: Federal, State, Local not specified - PPO | Source: Ambulatory Visit | Attending: Cardiology | Admitting: Cardiology

## 2019-06-13 ENCOUNTER — Other Ambulatory Visit: Payer: Self-pay

## 2019-06-13 DIAGNOSIS — R0989 Other specified symptoms and signs involving the circulatory and respiratory systems: Secondary | ICD-10-CM

## 2019-06-14 ENCOUNTER — Ambulatory Visit (INDEPENDENT_AMBULATORY_CARE_PROVIDER_SITE_OTHER)
Admission: RE | Admit: 2019-06-14 | Discharge: 2019-06-14 | Disposition: A | Discharge status: Home / Self Care | Payer: Self-pay | Source: Ambulatory Visit | Attending: Cardiovascular Disease | Admitting: Cardiovascular Disease

## 2019-06-14 DIAGNOSIS — R072 Precordial pain: Secondary | ICD-10-CM

## 2019-06-17 ENCOUNTER — Telehealth: Payer: Self-pay

## 2019-06-17 DIAGNOSIS — I10 Essential (primary) hypertension: Secondary | ICD-10-CM

## 2019-06-17 DIAGNOSIS — Z01812 Encounter for preprocedural laboratory examination: Secondary | ICD-10-CM

## 2019-06-17 MED ORDER — METOPROLOL TARTRATE 100 MG PO TABS
ORAL_TABLET | ORAL | 0 refills | Status: DC
Start: 2019-06-17 — End: 2019-08-02

## 2019-06-17 NOTE — Telephone Encounter (Signed)
Your cardiac CT will be scheduled at one of the below locations:   [facility] [address] [place], [place] [address] [phone]  [place] [address] [place], [place] [address] [phone]  If scheduled at [facility], please arrive at the [facility] main entrance of [facility] 30 minutes prior to test start time. Proceed to the [facility] Radiology Department (first floor) to check-in and test prep.  If scheduled at [facility], please arrive 15 mins early for check-in and test prep.  Please follow these instructions carefully (unless otherwise directed):    On the Night Before the Test: . Be sure to Drink plenty of water. . Do not consume any caffeinated/decaffeinated beverages or chocolate 12 hours prior to your test. . Do not take any antihistamines 12 hours prior to your test. . If you take Metformin do not take 24 hours prior to test.    On the Day of the Test: . Drink plenty of water. Do not drink any water within one hour of the test. . Do not eat any food 4 hours prior to the test. . You may take your regular medications prior to the test.  . Take metoprolol 100 mg. two hours prior to test. . FEMALES- please wear underwire-free bra if available        After the Test: . Drink plenty of water. . After receiving IV contrast, you may experience a mild flushed feeling. This is normal. . On occasion, you may experience a mild rash up to 24 hours after the test. This is not dangerous. If this occurs, you can take Benadryl 25 mg and increase your fluid intake. . If you experience trouble breathing, this can be serious. If it is severe call 911 IMMEDIATELY. If it is mild, please call our office. . If you take any of these medications: Glipizide/Metformin, Avandament, Glucavance, please do not take 48 hours after completing test unless otherwise  instructed.   Once we have confirmed authorization from your insurance company, we will call you to set up a date and time for your test.   For non-scheduling related questions, please contact the cardiac imaging nurse navigator should you have any questions/concerns: Marchia Bond, Cardiac Imaging Nurse Navigator Burley Saver, Interim Cardiac Imaging Nurse [place] [facility] Direct Office Dial: [phone]   For scheduling needs, including cancellations and rescheduling, please call [phone].

## 2019-06-18 ENCOUNTER — Other Ambulatory Visit: Payer: Self-pay

## 2019-06-18 DIAGNOSIS — Z01812 Encounter for preprocedural laboratory examination: Secondary | ICD-10-CM

## 2019-06-18 DIAGNOSIS — I10 Essential (primary) hypertension: Secondary | ICD-10-CM

## 2019-06-18 NOTE — Telephone Encounter (Signed)
Opened in error

## 2019-06-19 DIAGNOSIS — G4733 Obstructive sleep apnea (adult) (pediatric): Secondary | ICD-10-CM | POA: Diagnosis not present

## 2019-06-28 ENCOUNTER — Ambulatory Visit (INDEPENDENT_AMBULATORY_CARE_PROVIDER_SITE_OTHER): Payer: Federal, State, Local not specified - PPO | Admitting: Podiatry

## 2019-06-28 ENCOUNTER — Encounter: Payer: Self-pay | Admitting: Podiatry

## 2019-06-28 ENCOUNTER — Other Ambulatory Visit: Payer: Self-pay

## 2019-06-28 DIAGNOSIS — L97511 Non-pressure chronic ulcer of other part of right foot limited to breakdown of skin: Secondary | ICD-10-CM

## 2019-06-28 DIAGNOSIS — I739 Peripheral vascular disease, unspecified: Secondary | ICD-10-CM

## 2019-06-28 NOTE — Progress Notes (Signed)
She presents today for follow-up of a subfirst metatarsophalangeal joint ulcer right foot.  States that it seems to be getting a little better.  Objective: Vital signs are stable she alert oriented x3.  Denies fever chills nausea vomiting muscle aches and pains ulcerative lesion has decreased in size is now only about a millimeter in diameter.  Debrided reactive hyperkeratotic tissue today was no bleeding and no signs of infection.  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  Plan: Debridement and continue the use of the Darco shoe.  She will continue to dress daily.  Follow-up with her in 2 to 3 weeks

## 2019-07-05 ENCOUNTER — Telehealth (HOSPITAL_COMMUNITY): Payer: Self-pay | Admitting: *Deleted

## 2019-07-05 NOTE — Telephone Encounter (Signed)
Pt called and left message concerning her CTA.  Scan has not been ordered.  Pt's cardiology office made aware.

## 2019-07-08 ENCOUNTER — Other Ambulatory Visit: Payer: Self-pay

## 2019-07-08 ENCOUNTER — Telehealth: Payer: Self-pay

## 2019-07-08 DIAGNOSIS — R072 Precordial pain: Secondary | ICD-10-CM

## 2019-07-08 NOTE — Telephone Encounter (Signed)
Spoke to patient advised Dr.Berry ordered a coronary ct due to elevated calcium score and burning in chest.Advised orders are placed,once approved with insurance scheduler will call with appointment.She did receive instructions and voice understanding.

## 2019-07-11 DIAGNOSIS — Z1231 Encounter for screening mammogram for malignant neoplasm of breast: Secondary | ICD-10-CM | POA: Diagnosis not present

## 2019-07-11 DIAGNOSIS — Z1239 Encounter for other screening for malignant neoplasm of breast: Secondary | ICD-10-CM | POA: Diagnosis not present

## 2019-07-11 DIAGNOSIS — R921 Mammographic calcification found on diagnostic imaging of breast: Secondary | ICD-10-CM | POA: Diagnosis not present

## 2019-07-16 ENCOUNTER — Other Ambulatory Visit: Payer: Self-pay

## 2019-07-16 ENCOUNTER — Encounter: Payer: Self-pay | Admitting: Podiatry

## 2019-07-16 ENCOUNTER — Ambulatory Visit: Payer: Federal, State, Local not specified - PPO | Admitting: Podiatry

## 2019-07-16 DIAGNOSIS — L97511 Non-pressure chronic ulcer of other part of right foot limited to breakdown of skin: Secondary | ICD-10-CM | POA: Diagnosis not present

## 2019-07-16 DIAGNOSIS — I739 Peripheral vascular disease, unspecified: Secondary | ICD-10-CM

## 2019-07-16 NOTE — Progress Notes (Signed)
She presents today for a follow-up of a subfirst metatarsophalangeal joint ulceration right foot.  States that is good and ready to get into real shoes again.  Objective: Signs are stable she is alert and oriented x3.  Reactive hyperkeratotic lesion subfirst metatarsophalangeal joint is still present she presents today wearing her Darco shoe.  Once debrided does not demonstrate any open wound or lesion.  No purulence no malodor.  Assessment: Well-healing ulcerative lesion subfirst metatarsophalangeal joint right foot.  Plan: I would like to follow-up with her in 1 month to make sure she is doing well and allow her to get back into her regular shoe gear.

## 2019-07-22 DIAGNOSIS — R928 Other abnormal and inconclusive findings on diagnostic imaging of breast: Secondary | ICD-10-CM | POA: Diagnosis not present

## 2019-07-22 DIAGNOSIS — R921 Mammographic calcification found on diagnostic imaging of breast: Secondary | ICD-10-CM | POA: Diagnosis not present

## 2019-07-24 DIAGNOSIS — Z01812 Encounter for preprocedural laboratory examination: Secondary | ICD-10-CM | POA: Diagnosis not present

## 2019-07-24 DIAGNOSIS — E782 Mixed hyperlipidemia: Secondary | ICD-10-CM | POA: Diagnosis not present

## 2019-07-24 DIAGNOSIS — I10 Essential (primary) hypertension: Secondary | ICD-10-CM | POA: Diagnosis not present

## 2019-07-24 LAB — HEPATIC FUNCTION PANEL
ALT: 17 IU/L (ref 0–32)
AST: 17 IU/L (ref 0–40)
Albumin: 4.2 g/dL (ref 3.8–4.8)
Alkaline Phosphatase: 87 IU/L (ref 48–121)
Bilirubin Total: 0.5 mg/dL (ref 0.0–1.2)
Bilirubin, Direct: 0.17 mg/dL (ref 0.00–0.40)
Total Protein: 7.2 g/dL (ref 6.0–8.5)

## 2019-07-24 LAB — BASIC METABOLIC PANEL
BUN/Creatinine Ratio: 13 (ref 12–28)
BUN: 12 mg/dL (ref 8–27)
CO2: 23 mmol/L (ref 20–29)
Calcium: 9.3 mg/dL (ref 8.7–10.3)
Chloride: 98 mmol/L (ref 96–106)
Creatinine, Ser: 0.89 mg/dL (ref 0.57–1.00)
GFR calc Af Amer: 81 mL/min/{1.73_m2} (ref >59)
GFR calc non Af Amer: 70 mL/min/{1.73_m2} (ref >59)
Glucose: 128 mg/dL — ABNORMAL HIGH (ref 65–99)
Potassium: 4.5 mmol/L (ref 3.5–5.2)
Sodium: 138 mmol/L (ref 134–144)

## 2019-07-24 LAB — LIPID PANEL
Chol/HDL Ratio: 3 ratio (ref 0.0–4.4)
Cholesterol, Total: 172 mg/dL (ref 100–199)
HDL: 58 mg/dL (ref >39)
LDL Chol Calc (NIH): 100 mg/dL — ABNORMAL HIGH (ref 0–99)
Triglycerides: 75 mg/dL (ref 0–149)
VLDL Cholesterol Cal: 14 mg/dL (ref 5–40)

## 2019-07-31 ENCOUNTER — Telehealth (HOSPITAL_COMMUNITY): Payer: Self-pay | Admitting: *Deleted

## 2019-07-31 NOTE — Telephone Encounter (Signed)
Attempted to call patient regarding upcoming cardiac CT appointment. Left message on voicemail with name and callback number  Shine Scrogham Tai RN Navigator Cardiac Imaging [facility] [phone] Office [phone] Cell  

## 2019-08-01 ENCOUNTER — Other Ambulatory Visit: Payer: Self-pay

## 2019-08-01 ENCOUNTER — Ambulatory Visit (HOSPITAL_COMMUNITY)
Admission: RE | Admit: 2019-08-01 | Discharge: 2019-08-01 | Disposition: A | Discharge status: Home / Self Care | Payer: Federal, State, Local not specified - PPO | Source: Ambulatory Visit | Attending: Cardiovascular Disease | Admitting: Cardiovascular Disease

## 2019-08-01 ENCOUNTER — Encounter (HOSPITAL_COMMUNITY): Payer: Self-pay

## 2019-08-01 DIAGNOSIS — R072 Precordial pain: Secondary | ICD-10-CM

## 2019-08-01 DIAGNOSIS — I2510 Atherosclerotic heart disease of native coronary artery without angina pectoris: Secondary | ICD-10-CM | POA: Diagnosis not present

## 2019-08-01 IMAGING — CT CT HEART MORP W/ CTA COR W/ SCORE W/ CA W/CM &/OR W/O CM
4 of 7 series · 8 of 20 positions shown, 9 images · non-contrast
Comparison: None.
COMPARISON: None.

Addendum:
EXAM:
OVER-READ INTERPRETATION  CT CHEST

The following report is an over-read performed by radiologist Dr.
Fariba Remigio [REDACTED] on 08/01/2019. This
over-read does not include interpretation of cardiac or coronary
anatomy or pathology. The coronary calcium score/coronary CTA
interpretation by the cardiologist is attached.
CLINICAL DATA: Chest pain
Cardiac/Coronary CTA
TECHNIQUE: The patient was scanned on a Phillips Force scanner. A 100 kV
prospective scan was triggered in the descending thoracic aorta at
111 HU's. Axial non-contrast 3 mm slices were carried out through
the heart. The data set was analyzed on a dedicated work station and
scored using the Agatson method. Gantry rotation speed was 250 msecs
and collimation was .6 mm. No beta blockade and 0.8 mg of sl NTG was
given. The 3D data set was reconstructed in 5% intervals of the
35-75 % of the R-R cycle. Diastolic phases were analyzed on a
dedicated work station using MPR, MIP and VRT modes. The patient
received 80 cc of contrast.

[Series 7: best diast 74 % · axial · 0.36mm/px · z∈[+954,+993]mm · 2 of 291 slices shown, 3 images]
[im 97/291  vessel]
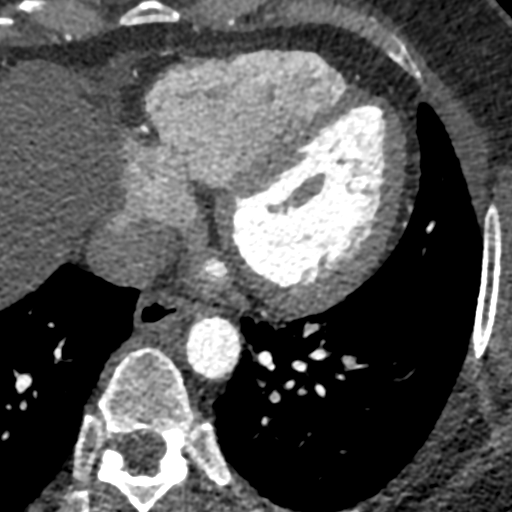
[im 97/291  lung]
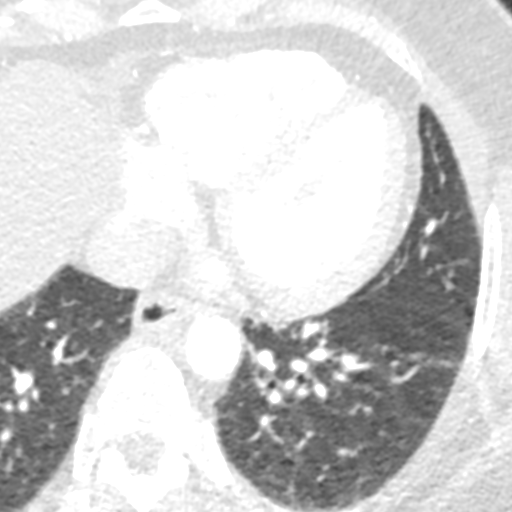
[im 194/291  vessel]
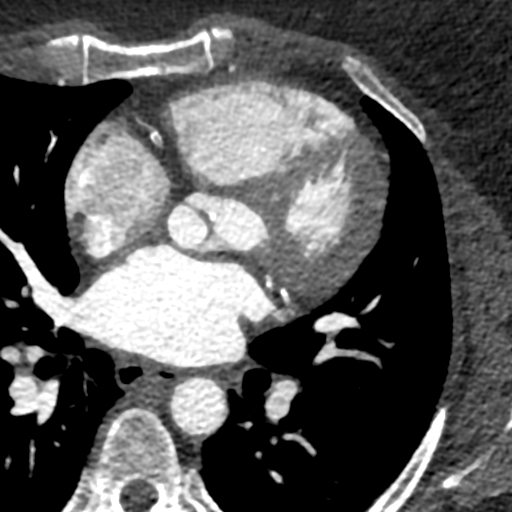

[Series 8: best syst 35 % · axial · 0.36mm/px · z∈[+954,+993]mm · 2 of 291 slices shown]
[im 97/291  vessel]
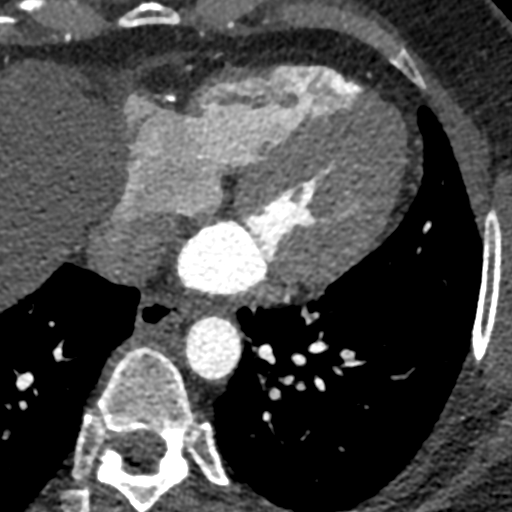
[im 194/291  vessel]
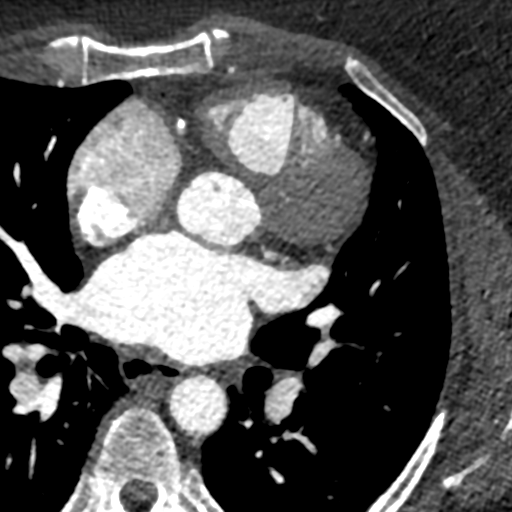

[Series 9: ts diast sharp 74 % · axial · 0.36mm/px · z∈[+954,+993]mm · 2 of 291 slices shown]
[im 97/291  lung]
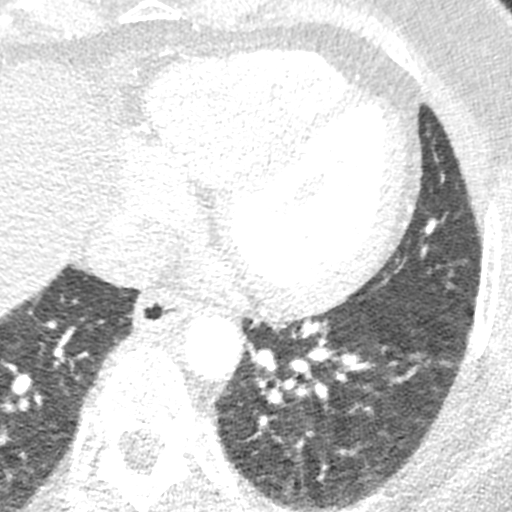
[im 194/291  lung]
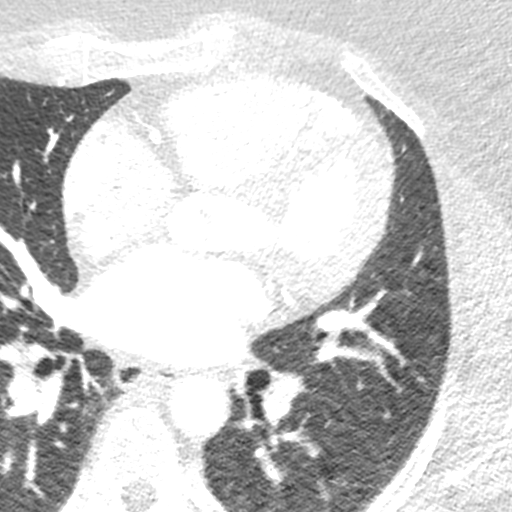

[Series 10: ts syst sharp 35 % · axial · 0.36mm/px · z∈[+954,+993]mm · 2 of 291 slices shown]
[im 97/291  lung]
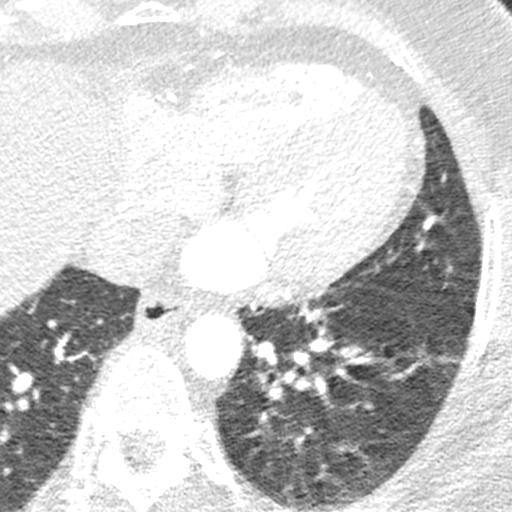
[im 194/291  lung]
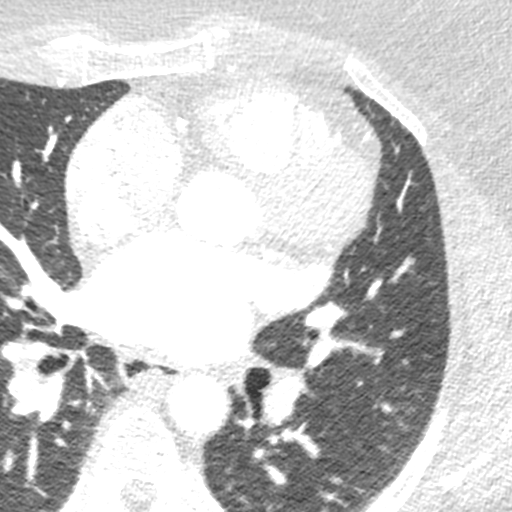

[8 of 20 positions shown; findings below may reference images not displayed]

FINDINGS: Small linear eccentric nonocclusive filling defect in a segmental
sized branch to the anterobasal segment of the left lower lobe
(axial image 23 of series 12), compatible with a nonocclusive
pulmonary thrombus (likely chronic). Within the visualized portions
of the thorax there are no suspicious appearing pulmonary nodules or
masses, there is no acute consolidative airspace disease, no pleural
effusions, no pneumothorax and no lymphadenopathy. Visualized
portions of the upper abdomen are unremarkable. There are no
aggressive appearing lytic or blastic lesions noted in the
visualized portions of the skeleton.
IMPRESSION: 1. There appears to be a nonocclusive likely chronic thrombus in a
segmental sized branch to the anterobasal segment of the left lower
lobe.
FINDINGS: Image quality: Average.

Noise artifact is: Limited.

Coronary Arteries:  Normal coronary origin.  Right dominance.

Left main: The left main is a large caliber vessel with a normal
take off from the left coronary cusp that bifurcates to form a left
anterior descending artery and a left circumflex artery. There is
minimal calcified plaque (<25%).

Left anterior descending artery: The proximal LAD contains moderate
mixed density plaque (50-69%). The mid LAD contains moderate mixed
density plaque (50-69%). There is an extensive myocardial bridge of
the mid to distal LAD. This segment is not interpretable.

Left circumflex artery: The LCX is non-dominant. The proximal LCX
contains mild calcified plaque (25-49%). The mid to distal LCX
contains mild non-calcified plaque (25-49%). OM1 is diffusely
diseased with calcified plaque of indeterminate stenosis. The LCX
terminates as a small OM2 branch with mild non-calcified plaque
(25-49%).

Right coronary artery: The RCA is dominant with normal take off from
the right coronary cusp. There is concerns for a severe stenosis of
mixed density plaque in the proximal RCA (70-99%). The mid and
distal RCA segments contain mild calcified plaque (25-49%). The RCA
terminates as a PDA with mild calcified plaque (25-49%).

Right Atrium: Right atrial size is within normal limits.

Right Ventricle: The right ventricular cavity is within normal
limits.

Left Atrium: Left atrial size is normal in size with no left atrial
appendage filling defect. A small PFO is present.

Left Ventricle: The ventricular cavity size is within normal limits.
There are no stigmata of prior infarction. There is no abnormal
filling defect.

Pulmonary arteries: Normal in size without proximal filling defect.

Pulmonary veins: Normal pulmonary venous drainage.

Pericardium: Normal thickness with no significant effusion or
calcium present.

Cardiac valves: The aortic valve is trileaflet with trace
calcifications. The mitral valve is normal structure without
significant calcification.

Aorta: Normal caliber with no significant disease.

Extra-cardiac findings: See attached radiology report for
non-cardiac structures.
IMPRESSION: 1. Coronary calcium score of 2211. This was 99th percentile for age
and sex matched control.

2. Normal coronary origin with right dominance.

3. Severe stenosis in the proximal RCA (70-99%).

4. Concerns for moderate stenoses in the proximal and mid LAD
(50-69%).

5. Extensive bridging of the mid to distal LAD that is not
interpretable.

6. Small PFO.

RECOMMENDATIONS:
1. Cardiac catheterization is recommended. CT FFR will be submitted
for the LAD.

*** End of Addendum ***
EXAM:
OVER-READ INTERPRETATION  CT CHEST

The following report is an over-read performed by radiologist Dr.
Fariba Remigio [REDACTED] on 08/01/2019. This
over-read does not include interpretation of cardiac or coronary
anatomy or pathology. The coronary calcium score/coronary CTA
interpretation by the cardiologist is attached.
FINDINGS: Small linear eccentric nonocclusive filling defect in a segmental
sized branch to the anterobasal segment of the left lower lobe
(axial image 23 of series 12), compatible with a nonocclusive
pulmonary thrombus (likely chronic). Within the visualized portions
of the thorax there are no suspicious appearing pulmonary nodules or
masses, there is no acute consolidative airspace disease, no pleural
effusions, no pneumothorax and no lymphadenopathy. Visualized
portions of the upper abdomen are unremarkable. There are no
aggressive appearing lytic or blastic lesions noted in the
visualized portions of the skeleton.
IMPRESSION: 1. There appears to be a nonocclusive likely chronic thrombus in a
segmental sized branch to the anterobasal segment of the left lower
lobe.

## 2019-08-01 MED ORDER — NITROGLYCERIN 0.4 MG SL SUBL
SUBLINGUAL_TABLET | SUBLINGUAL | Status: AC
  Administered 2019-08-01: 0.8 mg via SUBLINGUAL
  Filled 2019-08-01: qty 2

## 2019-08-01 MED ORDER — NITROGLYCERIN 0.4 MG SL SUBL: 0.8000 mg | SUBLINGUAL_TABLET | Freq: Once | SUBLINGUAL | Status: AC

## 2019-08-01 MED ORDER — IOHEXOL 350 MG/ML SOLN
100.0000 mL | Freq: Once | INTRAVENOUS | Status: AC | PRN
  Administered 2019-08-01: 100 mL via INTRAVENOUS

## 2019-08-02 ENCOUNTER — Ambulatory Visit: Payer: Federal, State, Local not specified - PPO | Admitting: Cardiovascular Disease

## 2019-08-02 ENCOUNTER — Encounter: Payer: Self-pay | Admitting: Cardiovascular Disease

## 2019-08-02 DIAGNOSIS — R079 Chest pain, unspecified: Secondary | ICD-10-CM

## 2019-08-02 DIAGNOSIS — I2510 Atherosclerotic heart disease of native coronary artery without angina pectoris: Secondary | ICD-10-CM | POA: Diagnosis not present

## 2019-08-02 DIAGNOSIS — I739 Peripheral vascular disease, unspecified: Secondary | ICD-10-CM

## 2019-08-02 DIAGNOSIS — E782 Mixed hyperlipidemia: Secondary | ICD-10-CM

## 2019-08-02 DIAGNOSIS — I10 Essential (primary) hypertension: Secondary | ICD-10-CM | POA: Diagnosis not present

## 2019-08-02 MED ORDER — ATORVASTATIN CALCIUM 80 MG PO TABS: 80.0000 mg | ORAL_TABLET | Freq: Every day | ORAL | 1 refills | Status: DC

## 2019-08-02 NOTE — Progress Notes (Signed)
08/02/2019 Monica Daniels   07-12-1960  [id number]  Primary Physician Shon Hale, MD Primary Cardiologist: Runell Gess MD Nicholes Calamity, [place]  HPI:  Monica Daniels is a 59 y.o.   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  She is referred by her podiatrist, Dr. Ernestene Kiel, for peripheral vascular valuation because of an ulcer on her right first metatarsal head.  I last saw her in the office 06/07/2019. Her cardiac risk factors are notable for treated hypertension, diabetes and hyperlipidemia.  Unfortunately her hemoglobin A1c's have remained above 8.  She does have a family history for heart disease in his mother who had CABG.  Her mother, Monica Daniels, was a patient of mine as well who died in 17-Apr-2018.  She is never had a heart attack or stroke.  She denies chest pain or shortness of breath, but does sometimes have burning in her chest which she is attributed to reflux.  She does not exercise much but denies claudication as well.  Her wound on her right great toe has since healed.  Her Doppler studies performed 06/06/2019 revealed a right ABI 0.75 and a left of 0.82 with a high-frequency signal in her distal right SFA as well as tibial vessel disease.  She does complain now of right calf claudication.  Because of her complaints of chest pain I performed a coronary CTA with FFR analysis 08/01/2019 suggesting at least two-vessel disease.  Based on this I recommend that we proceed with outpatient diagnostic coronary angiography.  I have asked her to begin taking a baby aspirin.    Current Meds  Medication Sig  . atorvastatin (LIPITOR) 40 MG tablet Take 1 tablet (40 mg total) by mouth daily.  . B-D UF III MINI PEN NEEDLES 31G X 5 MM MISC USE TO INJECT LANTUS DAILY AS DIRECTED  . FARXIGA 10 MG TABS tablet Take 10 mg by mouth daily.  . insulin glargine (LANTUS) 100 UNIT/ML injection Inject  13 Units into the skin at bedtime.   [name] latanoprost (XALATAN) 0.005 % ophthalmic solution   . lisinopril (ZESTRIL) 10 MG tablet Take 10 mg by mouth daily.  [name] loratadine (CLARITIN) 10 MG tablet Take 10 mg by mouth daily.  . metFORMIN (GLUCOPHAGE-XR) 500 MG 24 hr tablet TAKE 2 TABLETS BY MOUTH TWICE A DAY WITH MEALS  . Multiple Vitamin (MULTIVITAMIN WITH MINERALS) TABS tablet Take 1 tablet by mouth daily.  . vitamin B-12 (CYANOCOBALAMIN) 500 MCG tablet Take 500 mcg by mouth daily.  . [DISCONTINUED] metoprolol tartrate (LOPRESSOR) 100 MG tablet Take 100 mg 2 hours before Coronary Ct  . [DISCONTINUED] mupirocin ointment (BACTROBAN) 2 % Apply to wound after soaking BID     Allergies  Allergen Reactions  . Penicillins     Swelling Has patient had a PCN reaction causing immediate rash, facial/tongue/throat swelling, SOB or lightheadedness with hypotension: YES Has patient had a PCN reaction causing severe rash involving mucus membranes or skin necrosis: NO Has patient had a PCN reaction that required hospitalization NO Has patient had a PCN reaction occurring within the last 10 years: NO If all of the above answers are "NO", then may proceed with Cephalosporin use.  . Clindamycin/Lincomycin Rash    Social History   Socioeconomic History  . Marital status: Single    Spouse name: Not on file  . Number of children: Not on file  .  Years of education: Not on file  . Highest education level: Not on file  Occupational History  . Not on file  Tobacco Use  . Smoking status: Never Smoker  . Smokeless tobacco: Never Used  Substance and Sexual Activity  . Alcohol use: No  . Drug use: No  . Sexual activity: Not on file  Other Topics Concern  . Not on file  Social History Narrative  . Not on file   Social Determinants of Health   Financial Resource Strain:   . Difficulty of Paying Living Expenses:   Food Insecurity:   . Worried About Programme researcher, broadcasting/film/video in the Last Year:   . Engineer, site in the Last Year:   Transportation Needs:   . Freight forwarder (Medical):   [name] Lack of Transportation (Non-Medical):   Physical Activity:   . Days of Exercise per Week:   . Minutes of Exercise per Session:   Stress:   . Feeling of Stress :   Social Connections:   . Frequency of Communication with Friends and Family:   . Frequency of Social Gatherings with Friends and Family:   . Attends Religious Services:   . Active Member of Clubs or Organizations:   . Attends Banker Meetings:   [name] Marital Status:   Intimate Partner Violence:   . Fear of Current or Ex-Partner:   . Emotionally Abused:   [name] Physically Abused:   . Sexually Abused:      Review of Systems: General: negative for chills, fever, night sweats or weight changes.  Cardiovascular: negative for chest pain, dyspnea on exertion, edema, orthopnea, palpitations, paroxysmal nocturnal dyspnea or shortness of breath Dermatological: negative for rash Respiratory: negative for cough or wheezing Urologic: negative for hematuria Abdominal: negative for nausea, vomiting, diarrhea, bright red blood per rectum, melena, or hematemesis Neurologic: negative for visual changes, syncope, or dizziness All other systems reviewed and are otherwise negative except as noted above.    Blood pressure (!) 110/50, pulse 66, height 5\' 6"  (1.676 m), weight 266 lb (120.7 kg), last menstrual period 12/31/2012.  General appearance: alert and no distress Neck: no adenopathy, no carotid bruit, no JVD, supple, symmetrical, trachea midline and thyroid not enlarged, symmetric, no tenderness/mass/nodules Lungs: clear to auscultation bilaterally Heart: regular rate and rhythm, S1, S2 normal, no murmur, click, rub or gallop Extremities: extremities normal, atraumatic, no cyanosis or edema Pulses: 2+ and symmetric Skin: Skin color, texture, turgor normal. No rashes or lesions Neurologic: Alert and oriented X 3, normal strength and tone.  Normal symmetric reflexes. Normal coordination and gait  EKG not performed today  ASSESSMENT AND PLAN:   HLD (hyperlipidemia) History of hyperlipidemia on statin therapy with lipid profile performed 07/24/2019 revealing total cholesterol 172, LDL 100 and HDL 58.  I am going to increase her atorvastatin from 40 to 80 mg a day.  Essential hypertension History of essential hypertension blood pressure measured today 110/50.  She is on lisinopril.  Peripheral arterial disease (HCC) History of wound on her right first metatarsal which has since healed initially referred to me by Dr. 07/26/2019.  She does complain of right calf claudication.  Dopplers performed 06/06/2019 revealed a right ABI 0.75 and a left of 0.18 with a high-frequency signal in the distal right SFA.  She does have tibial disease as well.  She will need angiography and potential endovascular therapy at some point for lifestyle limiting claudication.  Chest pain of uncertain etiology Ms. Fahs is  complaining some atypical chest pain principally at night when she lies down but also with exertion with a feeling of tiredness in her chest.  She does have multiple cardiac risk factors including family history, diabetes, hyperlipidemia and hypertension.  She had a coronary CTA performed 08/01/2019 which suggested three-vessel disease.  Based on this I have decided to proceed with outpatient diagnostic coronary angiography on 08/12/2019 via the right radial approach.The patient understands that risks included but are not limited to stroke (1 in 1000), death (1 in 1000), kidney failure [usually temporary] (1 in 500), bleeding (1 in 200), allergic reaction [possibly serious] (1 in 200). The patient understands and agrees to proceed      Felix Meras J. Kamarii Buren MD FACP,FACC,FAHA, FSCAI 08/02/2019 12:43 PM 

## 2019-08-02 NOTE — Assessment & Plan Note (Signed)
History of essential hypertension blood pressure measured today 110/50.  She is on lisinopril.

## 2019-08-02 NOTE — Assessment & Plan Note (Signed)
History of wound on her right first metatarsal which has since healed initially referred to me by Dr. Al Corpus.  She does complain of right calf claudication.  Dopplers performed 06/06/2019 revealed a right ABI 0.75 and a left of 0.18 with a high-frequency signal in the distal right SFA.  She does have tibial disease as well.  She will need angiography and potential endovascular therapy at some point for lifestyle limiting claudication.

## 2019-08-02 NOTE — H&P (View-Only) (Signed)
08/02/2019 Monica Daniels   07-12-1960  [id number]  Primary Physician Shon Hale, MD Primary Cardiologist: Runell Gess MD Nicholes Calamity, [place]  HPI:  Monica Daniels is a 59 y.o.   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  She is referred by her podiatrist, Dr. Ernestene Kiel, for peripheral vascular valuation because of an ulcer on her right first metatarsal head.  I last saw her in the office 06/07/2019. Her cardiac risk factors are notable for treated hypertension, diabetes and hyperlipidemia.  Unfortunately her hemoglobin A1c's have remained above 8.  She does have a family history for heart disease in his mother who had CABG.  Her mother, Monica Daniels, was a patient of mine as well who died in 17-Apr-2018.  She is never had a heart attack or stroke.  She denies chest pain or shortness of breath, but does sometimes have burning in her chest which she is attributed to reflux.  She does not exercise much but denies claudication as well.  Her wound on her right great toe has since healed.  Her Doppler studies performed 06/06/2019 revealed a right ABI 0.75 and a left of 0.82 with a high-frequency signal in her distal right SFA as well as tibial vessel disease.  She does complain now of right calf claudication.  Because of her complaints of chest pain I performed a coronary CTA with FFR analysis 08/01/2019 suggesting at least two-vessel disease.  Based on this I recommend that we proceed with outpatient diagnostic coronary angiography.  I have asked her to begin taking a baby aspirin.    Current Meds  Medication Sig  . atorvastatin (LIPITOR) 40 MG tablet Take 1 tablet (40 mg total) by mouth daily.  . B-D UF III MINI PEN NEEDLES 31G X 5 MM MISC USE TO INJECT LANTUS DAILY AS DIRECTED  . FARXIGA 10 MG TABS tablet Take 10 mg by mouth daily.  . insulin glargine (LANTUS) 100 UNIT/ML injection Inject  13 Units into the skin at bedtime.   [name] latanoprost (XALATAN) 0.005 % ophthalmic solution   . lisinopril (ZESTRIL) 10 MG tablet Take 10 mg by mouth daily.  [name] loratadine (CLARITIN) 10 MG tablet Take 10 mg by mouth daily.  . metFORMIN (GLUCOPHAGE-XR) 500 MG 24 hr tablet TAKE 2 TABLETS BY MOUTH TWICE A DAY WITH MEALS  . Multiple Vitamin (MULTIVITAMIN WITH MINERALS) TABS tablet Take 1 tablet by mouth daily.  . vitamin B-12 (CYANOCOBALAMIN) 500 MCG tablet Take 500 mcg by mouth daily.  . [DISCONTINUED] metoprolol tartrate (LOPRESSOR) 100 MG tablet Take 100 mg 2 hours before Coronary Ct  . [DISCONTINUED] mupirocin ointment (BACTROBAN) 2 % Apply to wound after soaking BID     Allergies  Allergen Reactions  . Penicillins     Swelling Has patient had a PCN reaction causing immediate rash, facial/tongue/throat swelling, SOB or lightheadedness with hypotension: YES Has patient had a PCN reaction causing severe rash involving mucus membranes or skin necrosis: NO Has patient had a PCN reaction that required hospitalization NO Has patient had a PCN reaction occurring within the last 10 years: NO If all of the above answers are "NO", then may proceed with Cephalosporin use.  . Clindamycin/Lincomycin Rash    Social History   Socioeconomic History  . Marital status: Single    Spouse name: Not on file  . Number of children: Not on file  .  Years of education: Not on file  . Highest education level: Not on file  Occupational History  . Not on file  Tobacco Use  . Smoking status: Never Smoker  . Smokeless tobacco: Never Used  Substance and Sexual Activity  . Alcohol use: No  . Drug use: No  . Sexual activity: Not on file  Other Topics Concern  . Not on file  Social History Narrative  . Not on file   Social Determinants of Health   Financial Resource Strain:   . Difficulty of Paying Living Expenses:   Food Insecurity:   . Worried About Programme researcher, broadcasting/film/video in the Last Year:   . Engineer, site in the Last Year:   Transportation Needs:   . Freight forwarder (Medical):   [name] Lack of Transportation (Non-Medical):   Physical Activity:   . Days of Exercise per Week:   . Minutes of Exercise per Session:   Stress:   . Feeling of Stress :   Social Connections:   . Frequency of Communication with Friends and Family:   . Frequency of Social Gatherings with Friends and Family:   . Attends Religious Services:   . Active Member of Clubs or Organizations:   . Attends Banker Meetings:   [name] Marital Status:   Intimate Partner Violence:   . Fear of Current or Ex-Partner:   . Emotionally Abused:   [name] Physically Abused:   . Sexually Abused:      Review of Systems: General: negative for chills, fever, night sweats or weight changes.  Cardiovascular: negative for chest pain, dyspnea on exertion, edema, orthopnea, palpitations, paroxysmal nocturnal dyspnea or shortness of breath Dermatological: negative for rash Respiratory: negative for cough or wheezing Urologic: negative for hematuria Abdominal: negative for nausea, vomiting, diarrhea, bright red blood per rectum, melena, or hematemesis Neurologic: negative for visual changes, syncope, or dizziness All other systems reviewed and are otherwise negative except as noted above.    Blood pressure (!) 110/50, pulse 66, height 5\' 6"  (1.676 m), weight 266 lb (120.7 kg), last menstrual period 12/31/2012.  General appearance: alert and no distress Neck: no adenopathy, no carotid bruit, no JVD, supple, symmetrical, trachea midline and thyroid not enlarged, symmetric, no tenderness/mass/nodules Lungs: clear to auscultation bilaterally Heart: regular rate and rhythm, S1, S2 normal, no murmur, click, rub or gallop Extremities: extremities normal, atraumatic, no cyanosis or edema Pulses: 2+ and symmetric Skin: Skin color, texture, turgor normal. No rashes or lesions Neurologic: Alert and oriented X 3, normal strength and tone.  Normal symmetric reflexes. Normal coordination and gait  EKG not performed today  ASSESSMENT AND PLAN:   HLD (hyperlipidemia) History of hyperlipidemia on statin therapy with lipid profile performed 07/24/2019 revealing total cholesterol 172, LDL 100 and HDL 58.  I am going to increase her atorvastatin from 40 to 80 mg a day.  Essential hypertension History of essential hypertension blood pressure measured today 110/50.  She is on lisinopril.  Peripheral arterial disease (HCC) History of wound on her right first metatarsal which has since healed initially referred to me by Dr. 07/26/2019.  She does complain of right calf claudication.  Dopplers performed 06/06/2019 revealed a right ABI 0.75 and a left of 0.18 with a high-frequency signal in the distal right SFA.  She does have tibial disease as well.  She will need angiography and potential endovascular therapy at some point for lifestyle limiting claudication.  Chest pain of uncertain etiology Ms. Fahs is  complaining some atypical chest pain principally at night when she lies down but also with exertion with a feeling of tiredness in her chest.  She does have multiple cardiac risk factors including family history, diabetes, hyperlipidemia and hypertension.  She had a coronary CTA performed 08/01/2019 which suggested three-vessel disease.  Based on this I have decided to proceed with outpatient diagnostic coronary angiography on 08/12/2019 via the right radial approach.The patient understands that risks included but are not limited to stroke (1 in 1000), death (1 in 1000), kidney failure [usually temporary] (1 in 500), bleeding (1 in 200), allergic reaction [possibly serious] (1 in 200). The patient understands and agrees to proceed      Runell Gess MD [facility], [facility] 08/02/2019 12:43 PM

## 2019-08-02 NOTE — Patient Instructions (Signed)
Medication Instructions:  START ASPIRIN 81 MG DAILY   INCREASE YOUR ATORVASTATIN TO 80 MG DAILY   *If you need a refill on your cardiac medications before your next appointment, please call your pharmacy*  Lab Work: CBC/BMET FIRST OF NEXT WEEK   If you have labs (blood work) drawn today and your tests are completely normal, you will receive your results only by: [name] MyChart Message (if you have MyChart) OR . A paper copy in the mail If you have any lab test that is abnormal or we need to change your treatment, we will call you to review the results.  Testing/Procedures: Your physician has requested that you have a cardiac catheterization. Cardiac catheterization is used to diagnose and/or treat various heart conditions. Doctors may recommend this procedure for a number of different reasons. The most common reason is to evaluate chest pain. Chest pain can be a symptom of coronary artery disease (CAD), and cardiac catheterization can show whether plaque is narrowing or blocking your heart's arteries. This procedure is also used to evaluate the valves, as well as measure the blood flow and oxygen levels in different parts of your heart. For further information please visit [email or web address] Please follow instruction sheet, as given.  Follow-Up: At [facility], you and your health needs are our priority.  As part of our continuing mission to provide you with exceptional heart care, we have created designated Provider Care Teams.  These Care Teams include your primary Cardiologist (physician) and Advanced Practice Providers (APPs -  Physician Assistants and Nurse Practitioners) who all work together to provide you with the care you need, when you need it.  We recommend signing up for the patient portal called "MyChart".  Sign up information is provided on this After Visit Summary.  MyChart is used to connect with patients for Virtual Visits (Telemedicine).  Patients are able to view lab/test  results, encounter notes, upcoming appointments, etc.  Non-urgent messages can be sent to your provider as well.   To learn more about what you can do with MyChart, go to [email or web address].    Your next appointment:   2 WEEKS AFTER CATH WHICH IS SCHEDULED FOR 7/19  The format for your next appointment:   In Person  Provider:   You may see DR Allyson Sabal  or one of the following Advanced Practice Providers on your designated Care Team:    Corine Shelter, PA-C  [place], [place]  Edd Fabian, [place]    Other Instructions      [facility] CARDIOVASCULAR DIVISION [facility] [address] [place] [address] Dept: [phone] Loc: [phone]  Corinna Burkman  08/02/2019  You are scheduled for a Cardiac Catheterization on Monday, July 19 with Dr. [name].  1. Please arrive at the [facility] (Main Entrance A) at [facility]: [address] [place], [place] [address] at 9:30 AM (This time is two hours before your procedure to ensure your preparation). Free valet parking service is available.   Special note: Every effort is made to have your procedure done on time. Please understand that emergencies sometimes delay scheduled procedures.  2. Diet: Do not eat solid foods after midnight.  The patient may have clear liquids until 5am upon the day of the procedure.  3. Labs: You will need to have blood drawn FIRST OF NEXT WEEK You do not need to be fasting.  4. Medication instructions in preparation for your procedure:   Contrast Allergy: No  Take only 6 units of insulin the night before your procedure. Do not take any insulin on the day of the procedure. HALF OF YOUR USUAL DOSE   Do not take Diabetes Med Glucophage (Metformin) on the day of the procedure and HOLD 48 HOURS AFTER THE PROCEDURE.   HOLD YOUR LISINOPRIL MORNING OF PROCEDURE   On the morning of your procedure, take your Aspirin and any morning  medicines NOT listed above.  You may use sips of water.  5. Plan for one night stay--bring personal belongings. 6. Bring a current list of your medications and current insurance cards. 7. You MUST have a responsible person to drive you home. 8. Someone MUST be with you the first 24 hours after you arrive home or your discharge will be delayed. 9. Please wear clothes that are easy to get on and off and wear slip-on shoes.  Thank you for allowing [place] to care for you!   -- [facility]

## 2019-08-02 NOTE — Assessment & Plan Note (Signed)
Ms. Langford is complaining some atypical chest pain principally at night when she lies down but also with exertion with a feeling of tiredness in her chest.  She does have multiple cardiac risk factors including family history, diabetes, hyperlipidemia and hypertension.  She had a coronary CTA performed 08/01/2019 which suggested three-vessel disease.  Based on this I have decided to proceed with outpatient diagnostic coronary angiography on 08/12/2019 via the right radial approach.The patient understands that risks included but are not limited to stroke (1 in 1000), death (1 in 1000), kidney failure [usually temporary] (1 in 500), bleeding (1 in 200), allergic reaction [possibly serious] (1 in 200). The patient understands and agrees to proceed

## 2019-08-02 NOTE — Assessment & Plan Note (Signed)
History of hyperlipidemia on statin therapy with lipid profile performed 07/24/2019 revealing total cholesterol 172, LDL 100 and HDL 58.  I am going to increase her atorvastatin from 40 to 80 mg a day.

## 2019-08-06 ENCOUNTER — Other Ambulatory Visit: Payer: Self-pay | Admitting: *Deleted

## 2019-08-06 DIAGNOSIS — R931 Abnormal findings on diagnostic imaging of heart and coronary circulation: Secondary | ICD-10-CM

## 2019-08-06 DIAGNOSIS — E782 Mixed hyperlipidemia: Secondary | ICD-10-CM | POA: Diagnosis not present

## 2019-08-06 DIAGNOSIS — R079 Chest pain, unspecified: Secondary | ICD-10-CM | POA: Diagnosis not present

## 2019-08-06 DIAGNOSIS — I10 Essential (primary) hypertension: Secondary | ICD-10-CM | POA: Diagnosis not present

## 2019-08-06 DIAGNOSIS — I739 Peripheral vascular disease, unspecified: Secondary | ICD-10-CM | POA: Diagnosis not present

## 2019-08-06 LAB — CBC WITH DIFFERENTIAL/PLATELET
Basophils Absolute: 0.1 10*3/uL (ref 0.0–0.2)
Basos: 1 %
EOS (ABSOLUTE): 0.2 10*3/uL (ref 0.0–0.4)
Eos: 3 %
Hematocrit: 38 % (ref 34.0–46.6)
Hemoglobin: 12.9 g/dL (ref 11.1–15.9)
Immature Grans (Abs): 0 10*3/uL (ref 0.0–0.1)
Immature Granulocytes: 0 %
Lymphocytes Absolute: 2 10*3/uL (ref 0.7–3.1)
Lymphs: 38 %
MCH: 28.2 pg (ref 26.6–33.0)
MCHC: 33.9 g/dL (ref 31.5–35.7)
MCV: 83 fL (ref 79–97)
Monocytes Absolute: 0.5 10*3/uL (ref 0.1–0.9)
Monocytes: 10 %
Neutrophils Absolute: 2.4 10*3/uL (ref 1.4–7.0)
Neutrophils: 48 %
Platelets: 271 10*3/uL (ref 150–450)
RBC: 4.57 x10E6/uL (ref 3.77–5.28)
RDW: 12.6 % (ref 11.7–15.4)
WBC: 5.1 10*3/uL (ref 3.4–10.8)

## 2019-08-06 LAB — BASIC METABOLIC PANEL
BUN/Creatinine Ratio: 13 (ref 12–28)
BUN: 12 mg/dL (ref 8–27)
CO2: 25 mmol/L (ref 20–29)
Calcium: 9.8 mg/dL (ref 8.7–10.3)
Chloride: 101 mmol/L (ref 96–106)
Creatinine, Ser: 0.89 mg/dL (ref 0.57–1.00)
GFR calc Af Amer: 81 mL/min/{1.73_m2} (ref >59)
GFR calc non Af Amer: 70 mL/min/{1.73_m2} (ref >59)
Glucose: 131 mg/dL — ABNORMAL HIGH (ref 65–99)
Potassium: 4.7 mmol/L (ref 3.5–5.2)
Sodium: 138 mmol/L (ref 134–144)

## 2019-08-06 MED ORDER — SODIUM CHLORIDE 0.9% FLUSH: 3.0000 mL | Freq: Two times a day (BID) | INTRAVENOUS | Status: DC

## 2019-08-08 ENCOUNTER — Telehealth: Payer: Self-pay | Admitting: *Deleted

## 2019-08-08 NOTE — Telephone Encounter (Addendum)
Pt contacted pre-catheterization scheduled at [facility] for: Monday August 12, 2019 11:30 AM Verified arrival time and place: [facility] Main Entrance A [facility]) at: 9:30 AM   No solid food after midnight prior to cath, clear liquids until 5 AM day of procedure.  Hold: Farxiga-AM of procedure Metformin-day of procedure and 48 hours post procedure Insulin-1/2 usual Insulin dose HS prior to procedure  Except hold medications AM meds can be  taken pre-cath with sips of water including: ASA 81 mg   Confirmed patient has responsible adult to drive home post procedure and observe 24 hours after arriving home: yes  You are allowed ONE visitor in the waiting room during your procedure. Both you and your visitor must wear a mask once you enter the hospital.      COVID-19 Pre-Screening Questions:  . In the past 14 days have you had a new cough, shortness of breath, headache, congestion, fever (100 or greater) unexplained body aches, new sore throat, or sudden loss of taste or sense of smell? no . In the past 14  days have you been around anyone with known Covid 19? no  Reviewed procedure/mask/visitor instructions, COVID-19 questions with patient. Pt states she is fully vaccinated for COVID-19, see immunization history.

## 2019-08-12 ENCOUNTER — Other Ambulatory Visit: Payer: Self-pay

## 2019-08-12 ENCOUNTER — Ambulatory Visit (HOSPITAL_COMMUNITY)
Admission: RE | Admit: 2019-08-12 | Discharge: 2019-08-12 | Disposition: A | Discharge status: Home / Self Care | Payer: Federal, State, Local not specified - PPO | Attending: Cardiovascular Disease | Admitting: Cardiovascular Disease

## 2019-08-12 ENCOUNTER — Encounter (HOSPITAL_COMMUNITY)
Admission: RE | Disposition: A | Discharge status: Home / Self Care | Payer: Self-pay | Source: Home / Self Care | Attending: Cardiovascular Disease

## 2019-08-12 DIAGNOSIS — Z794 Long term (current) use of insulin: Secondary | ICD-10-CM | POA: Diagnosis not present

## 2019-08-12 DIAGNOSIS — R079 Chest pain, unspecified: Secondary | ICD-10-CM | POA: Insufficient documentation

## 2019-08-12 DIAGNOSIS — Z880 Allergy status to penicillin: Secondary | ICD-10-CM | POA: Insufficient documentation

## 2019-08-12 DIAGNOSIS — I2510 Atherosclerotic heart disease of native coronary artery without angina pectoris: Secondary | ICD-10-CM | POA: Diagnosis not present

## 2019-08-12 DIAGNOSIS — Z79899 Other long term (current) drug therapy: Secondary | ICD-10-CM | POA: Diagnosis not present

## 2019-08-12 DIAGNOSIS — I10 Essential (primary) hypertension: Secondary | ICD-10-CM | POA: Insufficient documentation

## 2019-08-12 DIAGNOSIS — I739 Peripheral vascular disease, unspecified: Secondary | ICD-10-CM | POA: Diagnosis present

## 2019-08-12 DIAGNOSIS — Z8249 Family history of ischemic heart disease and other diseases of the circulatory system: Secondary | ICD-10-CM | POA: Insufficient documentation

## 2019-08-12 DIAGNOSIS — E1151 Type 2 diabetes mellitus with diabetic peripheral angiopathy without gangrene: Secondary | ICD-10-CM | POA: Diagnosis not present

## 2019-08-12 DIAGNOSIS — Z6841 Body Mass Index (BMI) 40.0 and over, adult: Secondary | ICD-10-CM | POA: Insufficient documentation

## 2019-08-12 DIAGNOSIS — E785 Hyperlipidemia, unspecified: Secondary | ICD-10-CM | POA: Insufficient documentation

## 2019-08-12 DIAGNOSIS — R931 Abnormal findings on diagnostic imaging of heart and coronary circulation: Secondary | ICD-10-CM

## 2019-08-12 HISTORY — PX: LEFT HEART CATH AND CORONARY ANGIOGRAPHY: CATH118249

## 2019-08-12 LAB — GLUCOSE, CAPILLARY: Glucose-Capillary: 126 mg/dL — ABNORMAL HIGH (ref 70–99)

## 2019-08-12 SURGERY — LEFT HEART CATH AND CORONARY ANGIOGRAPHY
Anesthesia: LOCAL

## 2019-08-12 MED ORDER — CLOPIDOGREL BISULFATE 75 MG PO TABS
75.0000 mg | ORAL_TABLET | Freq: Every day | ORAL | 11 refills | Status: DC
Start: 2019-08-13 — End: 2020-08-06

## 2019-08-12 MED ORDER — LIDOCAINE HCL (PF) 1 % IJ SOLN
INTRAMUSCULAR | Status: AC
  Filled 2019-08-12: qty 30

## 2019-08-12 MED ORDER — SODIUM CHLORIDE 0.9 % WEIGHT BASED INFUSION: 1.0000 mL/kg/h | INTRAVENOUS | Status: DC

## 2019-08-12 MED ORDER — SODIUM CHLORIDE 0.9% FLUSH: 3.0000 mL | INTRAVENOUS | Status: DC | PRN

## 2019-08-12 MED ORDER — HEPARIN (PORCINE) IN NACL 1000-0.9 UT/500ML-% IV SOLN
INTRAVENOUS | Status: AC
  Filled 2019-08-12: qty 500

## 2019-08-12 MED ORDER — CLOPIDOGREL BISULFATE 300 MG PO TABS
ORAL_TABLET | ORAL | Status: AC
  Filled 2019-08-12: qty 1

## 2019-08-12 MED ORDER — IOHEXOL 350 MG/ML SOLN
INTRAVENOUS | Status: DC | PRN
  Administered 2019-08-12: 90 mL via INTRA_ARTERIAL

## 2019-08-12 MED ORDER — CLOPIDOGREL BISULFATE 300 MG PO TABS
ORAL_TABLET | ORAL | Status: DC | PRN
  Administered 2019-08-12: 300 mg via ORAL

## 2019-08-12 MED ORDER — ASPIRIN 81 MG PO CHEW: 81.0000 mg | CHEWABLE_TABLET | ORAL | Status: DC

## 2019-08-12 MED ORDER — SODIUM CHLORIDE 0.9 % WEIGHT BASED INFUSION
3.0000 mL/kg/h | INTRAVENOUS | Status: AC
  Administered 2019-08-12: 3 mL/kg/h via INTRAVENOUS

## 2019-08-12 MED ORDER — LIDOCAINE HCL (PF) 1 % IJ SOLN
INTRAMUSCULAR | Status: DC | PRN
  Administered 2019-08-12: 2 mL via INTRADERMAL

## 2019-08-12 MED ORDER — HEPARIN (PORCINE) IN NACL 1000-0.9 UT/500ML-% IV SOLN
INTRAVENOUS | Status: DC | PRN
  Administered 2019-08-12 (×2): 500 mL

## 2019-08-12 MED ORDER — SODIUM CHLORIDE 0.9 % IV SOLN: 250.0000 mL | INTRAVENOUS | Status: DC | PRN

## 2019-08-12 MED ORDER — VERAPAMIL HCL 2.5 MG/ML IV SOLN
INTRAVENOUS | Status: AC
  Filled 2019-08-12: qty 2

## 2019-08-12 MED ORDER — VERAPAMIL HCL 2.5 MG/ML IV SOLN
INTRA_ARTERIAL | Status: DC | PRN
  Administered 2019-08-12 (×2): 5 mL via INTRA_ARTERIAL

## 2019-08-12 MED ORDER — HEPARIN SODIUM (PORCINE) 1000 UNIT/ML IJ SOLN
INTRAMUSCULAR | Status: AC
  Filled 2019-08-12: qty 1

## 2019-08-12 MED ORDER — NITROGLYCERIN 1 MG/10 ML FOR IR/CATH LAB
INTRA_ARTERIAL | Status: AC
  Filled 2019-08-12: qty 10

## 2019-08-12 MED ORDER — HEPARIN SODIUM (PORCINE) 1000 UNIT/ML IJ SOLN
INTRAMUSCULAR | Status: DC | PRN
  Administered 2019-08-12: 6000 [IU] via INTRAVENOUS

## 2019-08-12 SURGICAL SUPPLY — 12 items
CATH INFINITI 5FR ANG PIGTAIL (CATHETERS) ×2 IMPLANT
CATH OPTITORQUE TIG 4.0 5F (CATHETERS) ×2 IMPLANT
DEVICE RAD COMP TR BAND LRG (VASCULAR PRODUCTS) ×2 IMPLANT
GLIDESHEATH SLEND A-KIT 6F 22G (SHEATH) ×2 IMPLANT
GUIDEWIRE INQWIRE 1.5J.035X260 (WIRE) ×1 IMPLANT
INQWIRE 1.5J .035X260CM (WIRE) ×2
KIT HEART LEFT (KITS) ×2 IMPLANT
PACK CARDIAC CATHETERIZATION (CUSTOM PROCEDURE TRAY) ×2 IMPLANT
SYR MEDRAD MARK 7 150ML (SYRINGE) ×2 IMPLANT
TRANSDUCER W/STOPCOCK (MISCELLANEOUS) ×2 IMPLANT
TUBING CIL FLEX 10 FLL-RA (TUBING) ×2 IMPLANT
WIRE HI TORQ VERSACORE-J 145CM (WIRE) ×2 IMPLANT

## 2019-08-12 NOTE — Interval H&P Note (Signed)
Cath Lab Visit (complete for each Cath Lab visit)  Clinical Evaluation Leading to the Procedure:   ACS: No.  Non-ACS:    Anginal Classification: CCS II  Anti-ischemic medical therapy: No Therapy  Non-Invasive Test Results: No non-invasive testing performed  Prior CABG: No previous CABG      History and Physical Interval Note:  08/12/2019 12:04 PM  Monica Daniels  has presented today for surgery, with the diagnosis of chest  pain.  The various methods of treatment have been discussed with the patient and family. After consideration of risks, benefits and other options for treatment, the patient has consented to  Procedure(s): LEFT HEART CATH AND CORONARY ANGIOGRAPHY (N/A) as a surgical intervention.  The patient's history has been reviewed, patient examined, no change in status, stable for surgery.  I have reviewed the patient's chart and labs.  Questions were answered to the patient's satisfaction.     [name]

## 2019-08-12 NOTE — Discharge Instructions (Signed)
DRINK PLENTY OF FLUIDS FOR THE NEXT 2-3 DAYS. ° °KEEP ARM ELEVATED THE REMAINDER OF THE DAY. ° °HOLD METFORMIN FOR A FULL 48 HOURS AFTER DISCHARGE. ° ° °Radial Site Care ° °This sheet gives you information about how to care for yourself after your procedure. Your health care provider may also give you more specific instructions. If you have problems or questions, contact your health care provider. °What can I expect after the procedure? °After the procedure, it is common to have: °· Bruising and tenderness at the catheter insertion area. °Follow these instructions at home: °Medicines °· Take over-the-counter and prescription medicines only as told by your health care provider. °Insertion site care °1. Follow instructions from your health care provider about how to take care of your insertion site. Make sure you: °? Wash your hands with soap and water before you change your bandage (dressing). If soap and water are not available, use hand sanitizer. °? Change your dressing as told by your health care provider. °2. Check your insertion site every day for signs of infection. Check for: °? Redness, swelling, or pain. °? Fluid or blood. °? Pus or a bad smell. °? Warmth. °3. Do not take baths, swim, or use a hot tub for 5 days. °4. You may shower 24-48 hours after the procedure. °? Remove the dressing and gently wash the site with plain soap and water. °? Pat the area dry with a clean towel. °? Do not rub the site. That could cause bleeding. °5. Do not apply powder or lotion to the site. °Activity ° °1. For 24 hours after the procedure, or as directed by your health care provider: °? Do not flex or bend the affected arm. °? Do not push or pull heavy objects with the affected arm. °? Do not drive yourself home from the hospital or clinic. You may drive 24 hours after the procedure. °? Do not operate machinery or power tools. °2. Do not push, pull or lift anything that is heavier than 10 lb for 5 days. °3. Ask your health  care provider when it is okay to: °? Return to work or school. °? Resume usual physical activities or sports. °? Resume sexual activity. °General instructions °· If the catheter site starts to bleed, raise your arm and put firm pressure on the site. If the bleeding does not stop, get help right away. This is a medical emergency. °· If you went home on the same day as your procedure, a responsible adult should be with you for the first 24 hours after you arrive home. °· Keep all follow-up visits as told by your health care provider. This is important. °Contact a health care provider if: °· You have a fever. °· You have redness, swelling, or yellow drainage around your insertion site. °Get help right away if: °· You have unusual pain at the radial site. °· The catheter insertion area swells very fast. °· The insertion area is bleeding, and the bleeding does not stop when you hold steady pressure on the area. °· Your arm or hand becomes pale, cool, tingly, or numb. °These symptoms may represent a serious problem that is an emergency. Do not wait to see if the symptoms will go away. Get medical help right away. Call your local emergency services (911 in the [place] Do not drive yourself to the hospital. °Summary °· After the procedure, it is common to have bruising and tenderness at the site. °· Follow instructions from your health care provider   about how to take care of your radial site wound. Check the wound every day for signs of infection. °· Do not push, pull or lift anything that is heavier than 10 lb for 5 days. ° °This information is not intended to replace advice given to you by your health care provider. Make sure you discuss any questions you have with your health care provider. °Document Revised: 02/15/2017 Document Reviewed: 02/15/2017 °Elsevier Patient Education © 2020 Elsevier Inc. °

## 2019-08-13 ENCOUNTER — Ambulatory Visit: Payer: Federal, State, Local not specified - PPO | Admitting: Cardiovascular Disease

## 2019-08-13 ENCOUNTER — Encounter (HOSPITAL_COMMUNITY): Payer: Self-pay | Admitting: Cardiovascular Disease

## 2019-08-13 VITALS — BP 110/60 | HR 75 | Ht 66.0 in | Wt 266.0 lb

## 2019-08-13 DIAGNOSIS — I25110 Atherosclerotic heart disease of native coronary artery with unstable angina pectoris: Secondary | ICD-10-CM | POA: Diagnosis not present

## 2019-08-13 NOTE — H&P (View-Only) (Signed)
[name] returns today for follow-up.  I performed outpatient diagnostic coronary angiography via the right radial approach and demonstrated tandem high-grade proximal dominant RCA stenoses that were fluoroscopically calcified as well as a high-grade calcified proximal LAD stenosis.  She will need debulking with orbital atherectomy followed by PCI and stenting.  I did load her with Plavix which she is taking daily.  We will plan on performing this procedure this coming Thursday.  Runell Gess, M.D., FACP, [facility], [facility] [address] [place], [place]  [address]  [phone] 08/13/2019 4:28 PM

## 2019-08-13 NOTE — Progress Notes (Signed)
Monica Daniels returns today for follow-up.  I performed outpatient diagnostic coronary angiography via the right radial approach and demonstrated tandem high-grade proximal dominant RCA stenoses that were fluoroscopically calcified as well as a high-grade calcified proximal LAD stenosis.  She will need debulking with orbital atherectomy followed by PCI and stenting.  I did load her with Plavix which she is taking daily.  We will plan on performing this procedure this coming Thursday.  Krystin Keeven J. Costas Sena, M.D., FACP, FACC, FAHA, FSCAI [facility] [address] [place], [place]  [address]  [phone] 08/13/2019 4:28 PM  

## 2019-08-13 NOTE — Patient Instructions (Signed)
Medication Instructions:  NO CHANGE *If you need a refill on your cardiac medications before your next appointment, please call your pharmacy*   Lab Work: If you have labs (blood work) drawn today and your tests are completely normal, you will receive your results only by: [name] MyChart Message (if you have MyChart) OR . A paper copy in the mail If you have any lab test that is abnormal or we need to change your treatment, we will call you to review the results.   Follow-Up: At [facility], you and your health needs are our priority.  As part of our continuing mission to provide you with exceptional heart care, we have created designated Provider Care Teams.  These Care Teams include your primary Cardiologist (physician) and Advanced Practice Providers (APPs -  Physician Assistants and Nurse Practitioners) who all work together to provide you with the care you need, when you need it.  We recommend signing up for the patient portal called "MyChart".  Sign up information is provided on this After Visit Summary.  MyChart is used to connect with patients for Virtual Visits (Telemedicine).  Patients are able to view lab/test results, encounter notes, upcoming appointments, etc.  Non-urgent messages can be sent to your provider as well.   To learn more about what you can do with MyChart, go to [email or web address].    Your next appointment:   2 week(s)  The format for your next appointment:   In Person  Provider:   [name], MD

## 2019-08-14 ENCOUNTER — Telehealth: Payer: Self-pay | Admitting: *Deleted

## 2019-08-14 NOTE — Telephone Encounter (Signed)
Pt contacted pre-coronary atherectomy scheduled at [facility] for: Thursday August 15, 2019 9:30 AM Verified arrival time and place: [facility] Main Entrance A [facility]) at: 7 AM-needs lab   No solid food after midnight prior to cath, clear liquids until 5 AM day of procedure.  Hold: Farxiga-AM of procedure 1/2 usual Insulin dose HS prior to procedure Pt states she is not currently taking metformin.  Except hold medications AM meds can be  taken pre-cath with sips of water including: ASA 81 mg Plavix 75 mg  Confirmed patient has responsible adult to drive home post procedure and observe 24 hours after arriving home: yes  You are allowed ONE visitor in the waiting room during your procedure. Both you and your visitor must wear a mask once you enter the hospital.      COVID-19 Pre-Screening Questions:  . In the past 14 days have you had a new cough associated with shortness of breath, fever (100.4 or greater) or sudden loss of taste or sense of smell? no . In the past 14 days have you been around anyone with known Covid 19? no . Any international travel in the past 14 days? no . Have you been vaccinated for COVID-19? Yes, see immunization history.

## 2019-08-15 ENCOUNTER — Telehealth: Payer: Self-pay

## 2019-08-15 ENCOUNTER — Ambulatory Visit (HOSPITAL_COMMUNITY)
Admission: RE | Admit: 2019-08-15 | Discharge: 2019-08-15 | Disposition: A | Discharge status: Home / Self Care | Payer: Federal, State, Local not specified - PPO | Attending: Cardiovascular Disease | Admitting: Cardiovascular Disease

## 2019-08-15 ENCOUNTER — Encounter (HOSPITAL_COMMUNITY)
Admission: RE | Disposition: A | Discharge status: Home / Self Care | Payer: Federal, State, Local not specified - PPO | Source: Home / Self Care | Attending: Cardiovascular Disease

## 2019-08-15 DIAGNOSIS — I25110 Atherosclerotic heart disease of native coronary artery with unstable angina pectoris: Secondary | ICD-10-CM | POA: Diagnosis not present

## 2019-08-15 DIAGNOSIS — E785 Hyperlipidemia, unspecified: Secondary | ICD-10-CM | POA: Diagnosis not present

## 2019-08-15 DIAGNOSIS — I10 Essential (primary) hypertension: Secondary | ICD-10-CM | POA: Insufficient documentation

## 2019-08-15 DIAGNOSIS — Z7902 Long term (current) use of antithrombotics/antiplatelets: Secondary | ICD-10-CM | POA: Diagnosis not present

## 2019-08-15 DIAGNOSIS — Z955 Presence of coronary angioplasty implant and graft: Secondary | ICD-10-CM | POA: Diagnosis not present

## 2019-08-15 DIAGNOSIS — Z880 Allergy status to penicillin: Secondary | ICD-10-CM | POA: Diagnosis not present

## 2019-08-15 DIAGNOSIS — R931 Abnormal findings on diagnostic imaging of heart and coronary circulation: Secondary | ICD-10-CM | POA: Diagnosis present

## 2019-08-15 DIAGNOSIS — K219 Gastro-esophageal reflux disease without esophagitis: Secondary | ICD-10-CM | POA: Insufficient documentation

## 2019-08-15 DIAGNOSIS — R079 Chest pain, unspecified: Secondary | ICD-10-CM | POA: Diagnosis present

## 2019-08-15 DIAGNOSIS — Z881 Allergy status to other antibiotic agents status: Secondary | ICD-10-CM | POA: Diagnosis not present

## 2019-08-15 DIAGNOSIS — Z794 Long term (current) use of insulin: Secondary | ICD-10-CM | POA: Insufficient documentation

## 2019-08-15 DIAGNOSIS — Z7982 Long term (current) use of aspirin: Secondary | ICD-10-CM | POA: Insufficient documentation

## 2019-08-15 DIAGNOSIS — Z79899 Other long term (current) drug therapy: Secondary | ICD-10-CM | POA: Insufficient documentation

## 2019-08-15 DIAGNOSIS — E1151 Type 2 diabetes mellitus with diabetic peripheral angiopathy without gangrene: Secondary | ICD-10-CM | POA: Insufficient documentation

## 2019-08-15 HISTORY — PX: CORONARY ATHERECTOMY: CATH118238

## 2019-08-15 HISTORY — PX: CORONARY STENT INTERVENTION: CATH118234

## 2019-08-15 LAB — GLUCOSE, CAPILLARY
Glucose-Capillary: 140 mg/dL — ABNORMAL HIGH (ref 70–99)
Glucose-Capillary: 116 mg/dL — ABNORMAL HIGH (ref 70–99)

## 2019-08-15 LAB — CBC
HCT: 40.3 % (ref 36.0–46.0)
Hemoglobin: 12.9 g/dL (ref 12.0–15.0)
MCH: 28 pg (ref 26.0–34.0)
MCHC: 32 g/dL (ref 30.0–36.0)
MCV: 87.4 fL (ref 80.0–100.0)
Platelets: 272 10*3/uL (ref 150–400)
RBC: 4.61 MIL/uL (ref 3.87–5.11)
RDW: 13.2 % (ref 11.5–15.5)
WBC: 5.2 10*3/uL (ref 4.0–10.5)
nRBC: 0 % (ref 0.0–0.2)

## 2019-08-15 LAB — BASIC METABOLIC PANEL
Anion gap: 11 (ref 5–15)
BUN: 10 mg/dL (ref 8–23)
CO2: 24 mmol/L (ref 22–32)
Calcium: 9.1 mg/dL (ref 8.9–10.3)
Chloride: 103 mmol/L (ref 98–111)
Creatinine, Ser: 0.95 mg/dL (ref 0.44–1.00)
GFR calc Af Amer: >60 mL/min (ref >60)
GFR calc non Af Amer: >60 mL/min (ref >60)
Glucose, Bld: 152 mg/dL — ABNORMAL HIGH (ref 70–99)
Potassium: 3.7 mmol/L (ref 3.5–5.1)
Sodium: 138 mmol/L (ref 135–145)

## 2019-08-15 LAB — POCT ACTIVATED CLOTTING TIME
Activated Clotting Time: 296 seconds
Activated Clotting Time: 274 seconds
Activated Clotting Time: 301 seconds

## 2019-08-15 SURGERY — CORONARY ATHERECTOMY
Anesthesia: LOCAL

## 2019-08-15 MED ORDER — SODIUM CHLORIDE 0.9% FLUSH: 3.0000 mL | INTRAVENOUS | Status: DC | PRN

## 2019-08-15 MED ORDER — IOHEXOL 350 MG/ML SOLN
INTRAVENOUS | Status: DC | PRN
  Administered 2019-08-15: 245 mL

## 2019-08-15 MED ORDER — LABETALOL HCL 5 MG/ML IV SOLN: 10.0000 mg | INTRAVENOUS | Status: DC | PRN

## 2019-08-15 MED ORDER — VERAPAMIL HCL 2.5 MG/ML IV SOLN
INTRAVENOUS | Status: AC
  Filled 2019-08-15: qty 2

## 2019-08-15 MED ORDER — SODIUM CHLORIDE 0.9 % IV SOLN: 250.0000 mL | INTRAVENOUS | Status: DC | PRN

## 2019-08-15 MED ORDER — ASPIRIN EC 81 MG PO TBEC
81.0000 mg | DELAYED_RELEASE_TABLET | Freq: Every day | ORAL | 0 refills | Status: DC
Start: 2019-08-15 — End: 2019-08-15

## 2019-08-15 MED ORDER — CLOPIDOGREL BISULFATE 300 MG PO TABS
ORAL_TABLET | ORAL | Status: AC
  Filled 2019-08-15: qty 1

## 2019-08-15 MED ORDER — HYDRALAZINE HCL 20 MG/ML IJ SOLN: 10.0000 mg | INTRAMUSCULAR | Status: DC | PRN

## 2019-08-15 MED ORDER — MORPHINE SULFATE (PF) 2 MG/ML IV SOLN: 2.0000 mg | INTRAVENOUS | Status: DC | PRN

## 2019-08-15 MED ORDER — MIDAZOLAM HCL 2 MG/2ML IJ SOLN
INTRAMUSCULAR | Status: AC
  Filled 2019-08-15: qty 2

## 2019-08-15 MED ORDER — HEPARIN (PORCINE) IN NACL 1000-0.9 UT/500ML-% IV SOLN
INTRAVENOUS | Status: DC | PRN
  Administered 2019-08-15: 500 mL

## 2019-08-15 MED ORDER — VERAPAMIL HCL 2.5 MG/ML IV SOLN
INTRAVENOUS | Status: DC | PRN
  Administered 2019-08-15 (×2): 5 mL via INTRA_ARTERIAL

## 2019-08-15 MED ORDER — ASPIRIN 81 MG PO CHEW: 81.0000 mg | CHEWABLE_TABLET | Freq: Every day | ORAL | Status: DC

## 2019-08-15 MED ORDER — IOHEXOL 350 MG/ML SOLN
INTRAVENOUS | Status: AC
  Filled 2019-08-15: qty 1

## 2019-08-15 MED ORDER — NITROGLYCERIN 1 MG/10 ML FOR IR/CATH LAB
INTRA_ARTERIAL | Status: AC
  Filled 2019-08-15: qty 10

## 2019-08-15 MED ORDER — LIDOCAINE HCL (PF) 1 % IJ SOLN
INTRAMUSCULAR | Status: AC
  Filled 2019-08-15: qty 30

## 2019-08-15 MED ORDER — CLOPIDOGREL BISULFATE 300 MG PO TABS
ORAL_TABLET | ORAL | Status: DC | PRN
  Administered 2019-08-15: 300 mg via ORAL

## 2019-08-15 MED ORDER — SODIUM CHLORIDE 0.9% FLUSH: 3.0000 mL | Freq: Two times a day (BID) | INTRAVENOUS | Status: DC

## 2019-08-15 MED ORDER — FENTANYL CITRATE (PF) 100 MCG/2ML IJ SOLN
INTRAMUSCULAR | Status: DC | PRN
  Administered 2019-08-15: 25 ug via INTRAVENOUS

## 2019-08-15 MED ORDER — LIDOCAINE HCL (PF) 1 % IJ SOLN
INTRAMUSCULAR | Status: DC | PRN
  Administered 2019-08-15: 2 mL

## 2019-08-15 MED ORDER — FENTANYL CITRATE (PF) 100 MCG/2ML IJ SOLN
INTRAMUSCULAR | Status: AC
  Filled 2019-08-15: qty 2

## 2019-08-15 MED ORDER — CLOPIDOGREL BISULFATE 75 MG PO TABS: 75.0000 mg | ORAL_TABLET | Freq: Every day | ORAL | 0 refills | Status: DC

## 2019-08-15 MED ORDER — ACETAMINOPHEN 325 MG PO TABS: 650.0000 mg | ORAL_TABLET | ORAL | Status: DC | PRN

## 2019-08-15 MED ORDER — HEPARIN SODIUM (PORCINE) 1000 UNIT/ML IJ SOLN
INTRAMUSCULAR | Status: DC | PRN
  Administered 2019-08-15: 12000 [IU] via INTRAVENOUS
  Administered 2019-08-15: 2500 [IU] via INTRAVENOUS

## 2019-08-15 MED ORDER — NITROGLYCERIN 1 MG/10 ML FOR IR/CATH LAB
INTRA_ARTERIAL | Status: DC | PRN
  Administered 2019-08-15 (×2): 200 ug via INTRACORONARY

## 2019-08-15 MED ORDER — SODIUM CHLORIDE 0.9 % IV SOLN: INTRAVENOUS | Status: AC

## 2019-08-15 MED ORDER — HEPARIN SODIUM (PORCINE) 1000 UNIT/ML IJ SOLN
INTRAMUSCULAR | Status: AC
  Filled 2019-08-15: qty 1

## 2019-08-15 MED ORDER — CLOPIDOGREL BISULFATE 75 MG PO TABS: 75.0000 mg | ORAL_TABLET | Freq: Every day | ORAL | Status: DC

## 2019-08-15 MED ORDER — NITROGLYCERIN 0.4 MG SL SUBL
0.4000 mg | SUBLINGUAL_TABLET | SUBLINGUAL | 2 refills | Status: DC | PRN
Start: 2019-08-15 — End: 2021-07-20

## 2019-08-15 MED ORDER — SODIUM CHLORIDE 0.9 % WEIGHT BASED INFUSION
3.0000 mL/kg/h | INTRAVENOUS | Status: DC
  Administered 2019-08-15: 3 mL/kg/h via INTRAVENOUS

## 2019-08-15 MED ORDER — MIDAZOLAM HCL 2 MG/2ML IJ SOLN
INTRAMUSCULAR | Status: DC | PRN
  Administered 2019-08-15: 1 mg via INTRAVENOUS

## 2019-08-15 MED ORDER — ATORVASTATIN CALCIUM 80 MG PO TABS: 80.0000 mg | ORAL_TABLET | Freq: Every day | ORAL | Status: DC

## 2019-08-15 MED ORDER — SODIUM CHLORIDE 0.9 % WEIGHT BASED INFUSION: 1.0000 mL/kg/h | INTRAVENOUS | Status: DC

## 2019-08-15 MED ORDER — HEPARIN (PORCINE) IN NACL 1000-0.9 UT/500ML-% IV SOLN
INTRAVENOUS | Status: AC
  Filled 2019-08-15: qty 1000

## 2019-08-15 MED ORDER — ASPIRIN 81 MG PO CHEW: 81.0000 mg | CHEWABLE_TABLET | ORAL | Status: DC

## 2019-08-15 MED ORDER — ONDANSETRON HCL 4 MG/2ML IJ SOLN: 4.0000 mg | Freq: Four times a day (QID) | INTRAMUSCULAR | Status: DC | PRN

## 2019-08-15 MED ORDER — CLOPIDOGREL BISULFATE 75 MG PO TABS: 75.0000 mg | ORAL_TABLET | ORAL | Status: DC

## 2019-08-15 SURGICAL SUPPLY — 30 items
BALLN SAPPHIRE 2.0X12 (BALLOONS) ×2
BALLN SAPPHIRE ~~LOC~~ 3.0X15 (BALLOONS) ×2 IMPLANT
BALLN SAPPHIRE ~~LOC~~ 3.25X12 (BALLOONS) ×2 IMPLANT
BALLOON SAPPHIRE 2.0X12 (BALLOONS) ×1 IMPLANT
CATH TELEPORT (CATHETERS) ×2 IMPLANT
CATH VISTA GUIDE 6FR HSTICK SH (CATHETERS) ×2 IMPLANT
CATH VISTA GUIDE 6FR JR4 (CATHETERS) ×2 IMPLANT
CATH VISTA GUIDE 6FR XBLAD3.5 (CATHETERS) ×2 IMPLANT
CATH VISTA GUIDE 6FR XBRCA (CATHETERS) IMPLANT
CATH VISTA GUIDE 6FR XBRCA SH (CATHETERS) ×2 IMPLANT
CROWN DIAMONDBACK CLASSIC 1.25 (BURR) ×2 IMPLANT
DEVICE RAD COMP TR BAND LRG (VASCULAR PRODUCTS) ×2 IMPLANT
ELECT DEFIB PAD ADLT CADENCE (PAD) ×2 IMPLANT
GLIDESHEATH SLEND A-KIT 6F 22G (SHEATH) ×2 IMPLANT
GUIDEWIRE INQWIRE 1.5J.035X260 (WIRE) ×1 IMPLANT
INQWIRE 1.5J .035X260CM (WIRE) ×2
KIT ENCORE 26 ADVANTAGE (KITS) ×2 IMPLANT
KIT HEART LEFT (KITS) ×2 IMPLANT
LUBRICANT VIPERSLIDE CORONARY (MISCELLANEOUS) ×2 IMPLANT
PACK CARDIAC CATHETERIZATION (CUSTOM PROCEDURE TRAY) ×2 IMPLANT
STENT RESOLUTE ONYX 2.25X12 (Permanent Stent) ×2 IMPLANT
STENT RESOLUTE ONYX 2.75X12 (Permanent Stent) ×2 IMPLANT
STENT RESOLUTE ONYX 2.75X26 (Permanent Stent) ×2 IMPLANT
STENT RESOLUTE ONYX 3.0X18 (Permanent Stent) ×2 IMPLANT
TRANSDUCER W/STOPCOCK (MISCELLANEOUS) ×2 IMPLANT
TUBING CIL FLEX 10 FLL-RA (TUBING) ×2 IMPLANT
WIRE ASAHI PROWATER 180CM (WIRE) ×2 IMPLANT
WIRE ASAHI PROWATER 300CM (WIRE) ×2 IMPLANT
WIRE HI TORQ VERSACORE-J 145CM (WIRE) ×2 IMPLANT
WIRE VIPERWIRE COR FLEX .012 (WIRE) ×2 IMPLANT

## 2019-08-15 NOTE — Discharge Summary (Addendum)
Discharge Summary for Same Day PCI   Patient ID: Monica Daniels MRN: [id number]; DOB: 22-Jan-1961  Admit date: 08/15/2019 Discharge date: 08/15/2019  Primary Care Provider: Glenis Smoker, MD  Primary Cardiologist: Dr. Gwenlyn Found Primary Electrophysiologist:  None   Discharge Diagnoses    Active Problems:   Chest pain of uncertain etiology   Abnormal cardiac CT angiography    Diagnostic Studies/Procedures    Cardiac Catheterization/Coronary Atherectomy 08/15/2019: Prox RCA-1 lesion is 95% stenosed. Prox RCA-2 lesion is 95% stenosed. Prox LAD lesion is 95% stenosed. Prox LAD to Mid LAD lesion is 70% stenosed. Mid LAD lesion is 80% stenosed. A drug-eluting stent was successfully placed using a STENT RESOLUTE ONYX 2.75X26. Post intervention, there is a 0% residual stenosis. Post intervention, there is a 0% residual stenosis. A drug-eluting stent was successfully placed. Post intervention, there is a 0% residual stenosis. Post intervention, there is a 0% residual stenosis. A drug-eluting stent was successfully placed. Post intervention, there is a 0% residual stenosis.   Impression: Successful PCI and drug-eluting stent of the proximal tandem RCA lesions using resolute Onyx drug-eluting stent postdilated to 3.1 mm.  Successful orbital atherectomy of a high-grade calcified proximal LAD lesion with subsequent stenting of the mid LAD and proximal LAD using 3 resolute Onyx drug-eluting stents.  Patient tolerated procedure well.  She was already on aspirin Plavix and did receive an additional 300 mg of p.o. Plavix.  She will be discharged home today as an outpatient and will arrange follow-up with me in 2 weeks.  Diagnostic Dominance: Right  Intervention     _____________   History of Present Illness     Monica Daniels is a 59 y.o. 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 Patient was seen by Dr. Gwenlyn Found on in 5/2021at which time she denied any real chest pain or shortness of breath but did note some burning in her chest which she attributed to reflux. She reported that she does not exercise much but denied any claudication. Coronary CTA was ordered and performed on 08/01/2019 - it was suggestive of multivessel disease. Therefore, patient was seen back in the office by Dr. Gwenlyn Found on 08/02/2019 and cardiac catheterization was arranged for further evaluation. Left heart catheterization was performed on 08/12/2019 and showed severe 2 vessel disease with tandem lesion in her proximal RCA that were calcified and significant disease in proximal LAD and mid/distal LAD as well. Decision made to bring patient back for staged orbital atherecotmy of RCA and LAD. This was scheduled for 08/15/2019.  Hospital Course     The patient underwent cardiac catheterization for planned coronary athetercomty as noted above. Cath showed 95% stenosis in the proximal RCA followed by another 95% stenosis in the proximal to mid RCA as well as 95% stenosis of proximal LAD followed by 70% stenosis in proximal to mid LAD and 80% stenosis in mid LAD. Patient was treated with successful PCI and DES of the proximal tandem RCA lesion as well as successful orbital atherectomy of the proximal LAD lesion with subsequent stenting of proximal LAD and mid LAD using 3 DES. Patient tolerated procedure well. Plan for DAPT with Aspirin '81mg'$  daily and Plavix '75mg'$  daily for at least 12 months (already on this at home). The patient was seen by Cardiac Rehab while  in short stay. There were no observed complications post cath. Right radial cath site was re-evaluated prior to discharge and found to be stable without any complications.  Instructions/precautions regarding cath site care were given prior to discharge.  Monica Daniels  was seen by Dr. Gwenlyn Found and determined stable for discharge home. Follow up with our office has been arranged. Medications are listed below. No pertinent medication changes.   _____________  Cath/PCI Registry Performance & Quality Measures: Aspirin prescribed? - Yes ADP Receptor Inhibitor (Plavix/Clopidogrel, Brilinta/Ticagrelor or Effient/Prasugrel) prescribed (includes medically managed patients)? - Yes High Intensity Statin (Lipitor 40-'80mg'$  or Crestor 20-'40mg'$ ) prescribed? - Yes For EF <40%, was ACEI/ARB prescribed? - Not Applicable (EF >/= 16%) For EF <40%, Aldosterone Antagonist (Spironolactone or Eplerenone) prescribed? - Not Applicable (EF >/= 10%) Cardiac Rehab Phase II ordered (Included Medically managed Patients)? - Yes  _____________   Discharge Vitals Blood pressure (!) 117/48, pulse 70, temperature 98.5 F (36.9 C), temperature source Oral, resp. rate 22, height '5\' 6"'$  (1.676 m), weight 118 kg, last menstrual period 12/31/2012, SpO2 100 %.  Filed Weights   08/15/19 0656  Weight: 118 kg    Last Labs & Radiologic Studies    CBC Recent Labs    08/15/19 0737  WBC 5.2  HGB 12.9  HCT 40.3  MCV 87.4  PLT 960   Basic Metabolic Panel Recent Labs    08/15/19 0737  NA 138  K 3.7  CL 103  CO2 24  GLUCOSE 152*  BUN 10  CREATININE 0.95  CALCIUM 9.1   Liver Function Tests No results for input(s): AST, ALT, ALKPHOS, BILITOT, PROT, ALBUMIN in the last 72 hours. No results for input(s): LIPASE, AMYLASE in the last 72 hours. High Sensitivity Troponin:   No results for input(s): TROPONINIHS in the last 720 hours.  BNP Invalid input(s): POCBNP D-Dimer No results for input(s): DDIMER in the last 72 hours. Hemoglobin A1C No results for input(s): HGBA1C in the last 72 hours. Fasting Lipid Panel No results for input(s): CHOL, HDL, LDLCALC, TRIG, CHOLHDL, LDLDIRECT in the last 72 hours. Thyroid Function Tests No results for input(s): TSH, T4TOTAL, T3FREE, THYROIDAB in the  last 72 hours.  Invalid input(s): FREET3 _____________  CARDIAC CATHETERIZATION  Result Date: 08/15/2019  Prox RCA-1 lesion is 95% stenosed.  Prox RCA-2 lesion is 95% stenosed.  Prox LAD lesion is 95% stenosed.  Prox LAD to Mid LAD lesion is 70% stenosed.  Mid LAD lesion is 80% stenosed.  A drug-eluting stent was successfully placed using a STENT RESOLUTE ONYX 2.75X26.  Post intervention, there is a 0% residual stenosis.  Post intervention, there is a 0% residual stenosis.  A drug-eluting stent was successfully placed.  Post intervention, there is a 0% residual stenosis.  Post intervention, there is a 0% residual stenosis.  A drug-eluting stent was successfully placed.  Post intervention, there is a 0% residual stenosis.  Monica Daniels is a 59 y.o. female  [id number] LOCATION:  FACILITY: [facility] PHYSICIAN: Quay Burow, M.D. 16-Dec-1960 DATE OF PROCEDURE:  08/15/2019 DATE OF DISCHARGE: CARDIAC CATHETERIZATION / PCI DES RCA/CSI DES LAD History obtained from chart review.Monica Daniels is a 59 y.o.   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  She is referred by her podiatrist, Dr. Tyson Dense, for peripheral vascular valuation because of an ulcer on her right first metatarsal head.  I last saw her in the office 06/07/2019. Her cardiac  risk factors are notable for treated hypertension, diabetes and hyperlipidemia.  Unfortunately her hemoglobin A1c's have remained above 8.  She does have a family history for heart disease in his mother who had CABG.  Her mother, Sulamita Lafountain, was a patient of mine as well who died in 04-18-2018.  She is never had a heart attack or stroke.  She denies chest pain or shortness of breath, but does sometimes have burning in her chest which she is attributed to reflux.  She does not exercise much but denies claudication as well.   Her wound on her right great toe has since healed.  Her  Doppler studies performed 06/06/2019 revealed a right ABI 0.75 and a left of 0.82 with a high-frequency signal in her distal right SFA as well as tibial vessel disease.  She does complain now of right calf claudication.  Because of her complaints of chest pain I performed a coronary CTA with FFR analysis 08/01/2019 suggesting at least two-vessel disease.  Based on this I recommend that we proceed with outpatient diagnostic coronary angiography.  She had a diagnostic cath performed on 08/12/2019 revealing high-grade tandem proximal dominant RCA stenoses and high-grade calcified proximal LAD disease with mid LAD disease as well.  She presents today for RCA stenting, LAD orbital atherectomy, PCI drug-eluting stenting.  She was started on Plavix on Monday and she has taken it every day since. PROCEDURE DESCRIPTION: The patient was brought to the second floor [facility] cath lab in the postabsorptive state.  She was premedicated with IV Versed and fentanyl.  Her right wristwas prepped and shaved in usual sterile fashion. Xylocaine 1% was used for local anesthesia. A 6 French sheath was inserted into the right radial  artery using standard Seldinger technique.  Patient received 14,500's of heparin with an ACT of over 300.  Isovue dye was used for the entirety of the case.  Left ventricular pressures monitored during the case.  The patient received radial cocktail. Initially addressed the RCA using a 6 [place] hockey-stick guide catheter with sideholes.  A right Judkins damped on engagement of the ostium.  I crossed both lesions with a 0.14 Prowater wire, predilated with a 2 mm x 12 mm balloon in both the proximal lesions.  I then placed a 2.75 mm x 26 mm long resolute Onyx drug-eluting stent across both lesions, deployed at 14 atm.  I postdilated the stent with a 3 mm x 15 mm long noncompliant balloon up to 16 atm (3.1 mm resulting reduction of tandem 95% proximal dominant RCA stenoses to 0% residual. Following this I  dressed the LAD using a 6 French XB LAD 3.5 similar guide catheter.  I crossed the 95 to 99% calcified proximal LAD stenosis with a 0.14/300 cm length prowater and exchanged for a Viper using a TelePort catheter.  I then performed orbital atherectomy with a 1.25 micro bur 60,000 RPMs.  I performed 3 passes.  I then exchanged the Viper wire using the teleport for the previously used prowater.  I predilated the distal LAD lesion with a 2 mm x 12 mm balloon and placed a 2.25 mm x 12 mm long resolute Onyx stent across the lesion deployed at 14 atm.  There was a "waist".  Following this I placed a 3 mm x 18 mm long resolute Onyx drug-eluting stent across the proximal LAD stenosis and postdilated with a 3.25 mm x 12 mm balloon.  There was a 70% stenosis just distal to this which  I primarily stented with a 2.75 mm x 12 mm long resolute Onyx drug-eluting stent up to 16 atm and postdilated the overlap region with the same 3.25 mm balloon up to 16 atm resulting reduction of proximal 95 to 99% calcified LAD lesion is 0% residual and 80% mid LAD lesion to 0% residual.  The patient tolerated the procedure well.  The radial sheath was removed and a TR band was placed on the right wrist to achieve patent hemostasis.  The patient left lab in stable condition.   Successful PCI and drug-eluting stent of the proximal tandem RCA lesions using resolute Onyx drug-eluting stent postdilated to 3.1 mm.  Successful orbital atherectomy of a high-grade calcified proximal LAD lesion with subsequent stenting of the mid LAD and proximal LAD using 3 resolute Onyx drug-eluting stents.  Patient tolerated procedure well.  She was already on aspirin Plavix and did receive an additional 300 mg of p.o. Plavix.  She will be discharged home today as an outpatient and will arrange follow-up with me in 2 weeks. Quay Burow. MD, [facility] 08/15/2019 11:49 AM   CARDIAC CATHETERIZATION  Result Date: 08/12/2019  Prox RCA-1 lesion is 95% stenosed.  Prox  RCA-2 lesion is 95% stenosed.  Mid RCA lesion is 30% stenosed.  Prox LAD lesion is 90% stenosed.  Mid LAD to Dist LAD lesion is 90% stenosed.  The left ventricular systolic function is normal.  LV end diastolic pressure is normal.  The left ventricular ejection fraction is 55-65% by visual estimate.  Monica Daniels is a 59 y.o. female  [id number] LOCATION:  FACILITY: [facility] PHYSICIAN: Quay Burow, M.D. 05-15-1960 DATE OF PROCEDURE:  08/12/2019 DATE OF DISCHARGE: CARDIAC CATHETERIZATION History obtained from chart review.Monica Daniels is a 60 y.o.   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  She is referred by her podiatrist, Dr. Tyson Dense, for peripheral vascular valuation because of an ulcer on her right first metatarsal head.  I last saw her in the office 06/07/2019. Her cardiac risk factors are notable for treated hypertension, diabetes and hyperlipidemia.  Unfortunately her hemoglobin A1c's have remained above 8.  She does have a family history for heart disease in his mother who had CABG.  Her mother, Akyla Vavrek, was a patient of mine as well who died in 04-04-2018.  She is never had a heart attack or stroke.  She denies chest pain or shortness of breath, but does sometimes have burning in her chest which she is attributed to reflux.  She does not exercise much but denies claudication as well.   Her wound on her right great toe has since healed.  Her Doppler studies performed 06/06/2019 revealed a right ABI 0.75 and a left of 0.82 with a high-frequency signal in her distal right SFA as well as tibial vessel disease.  She does complain now of right calf claudication.  Because of her complaints of chest pain I performed a coronary CTA with FFR analysis 08/01/2019 suggesting at least two-vessel disease.  Based on this I recommend that we proceed with outpatient diagnostic coronary angiography.  I have asked her to  begin taking a baby aspirin.   Ms. Oldaker has severe two-vessel disease with tandem lesions in her proximal RCA that were calcified and her proximal LAD and mid to distal LAD as well.  She has normal LV function.  These correlate exactly to the findings and coronary CTA.  She will need staged orbital atherectomy of her RCA and LAD.  The sheath was removed and a TR band was placed on the right wrist to achieve patent hemostasis.  The patient left lab in stable condition.  She will be discharged home today with follow-up with me in the office tomorrow with the intent to schedule her atherectomy this coming Thursday.  I did load her with Plavix today as well. [name]. MD, [facility] 08/12/2019 12:50 PM   CT CORONARY MORPH W/CTA COR W/SCORE W/CA W/CM &/OR WO/CM  Addendum Date: 08/01/2019   ADDENDUM REPORT: 08/01/2019 17:45 CLINICAL DATA:  Chest pain EXAM: Cardiac/Coronary CTA TECHNIQUE: The patient was scanned on a [employer]. A 100 kV prospective scan was triggered in the descending thoracic aorta at 111 HU's. Axial non-contrast 3 mm slices were carried out through the heart. The data set was analyzed on a dedicated work station and scored using the Agatson method. Gantry rotation speed was 250 msecs and collimation was .6 mm. No beta blockade and 0.8 mg of sl NTG was given. The 3D data set was reconstructed in 5% intervals of the 35-75 % of the R-R cycle. Diastolic phases were analyzed on a dedicated work station using MPR, MIP and VRT modes. The patient received 80 cc of contrast. FINDINGS: Image quality: Average. Noise artifact is: Limited. Coronary Arteries:  Normal coronary origin.  Right dominance. Left main: The left main is a large caliber vessel with a normal take off from the left coronary cusp that bifurcates to form a left anterior descending artery and a left circumflex artery. There is minimal calcified plaque (<25%). Left anterior descending artery: The proximal LAD contains moderate mixed  density plaque (50-69%). The mid LAD contains moderate mixed density plaque (50-69%). 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 This segment is not interpretable. Left circumflex artery: The LCX is non-dominant. The proximal LCX contains mild calcified plaque (25-49%). The mid to distal LCX contains mild non-calcified plaque (25-49%). OM1 is diffusely diseased with calcified plaque of indeterminate stenosis. The LCX terminates as a small OM2 branch with mild non-calcified plaque (25-49%). Right coronary artery: The RCA is dominant with normal take off from the right coronary cusp. There is concerns for a severe stenosis of mixed density plaque in the proximal RCA (70-99%). The mid and distal RCA segments contain mild calcified plaque (25-49%). The RCA terminates as a PDA with mild calcified plaque (25-49%). Right Atrium: Right atrial size is within normal limits. Right Ventricle: The right ventricular cavity is within normal limits. Left Atrium: Left atrial size is normal in size with no left atrial appendage filling defect. A small PFO is present. Left Ventricle: The ventricular cavity size is within normal limits. There are no stigmata of prior infarction. There is no abnormal filling defect. Pulmonary arteries: Normal in size without proximal filling defect. Pulmonary veins: Normal pulmonary venous drainage. Pericardium: Normal thickness with no significant effusion or calcium present. Cardiac valves: The aortic valve is trileaflet with trace calcifications. The mitral valve is normal structure without significant calcification. Aorta: Normal caliber with no significant disease. Extra-cardiac findings: See attached radiology report for non-cardiac structures. IMPRESSION: 1. Coronary calcium score of 1103. This was 99th percentile for age and sex matched control. 2. Normal coronary origin with right dominance. 3. 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 4. Concerns for moderate  stenoses in the proximal and mid LAD (50-69%). 5. Extensive bridging of the mid to distal LAD that  is not interpretable. 6. Small PFO. RECOMMENDATIONS: 1. Cardiac catheterization is recommended. CT FFR will be submitted for the LAD. Eleonore Chiquito, MD Electronically Signed   By: Eleonore Chiquito   On: 08/01/2019 17:45   Result Date: 08/01/2019 EXAM: OVER-READ INTERPRETATION  CT CHEST The following report is an over-read performed by radiologist Dr. Vinnie Langton of [facility], [place] on 08/01/2019. This over-read does not include interpretation of cardiac or coronary anatomy or pathology. The coronary calcium score/coronary CTA interpretation by the cardiologist is attached. COMPARISON:  None. 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 Within the visualized portions of the thorax there are no suspicious appearing pulmonary nodules or masses, there is no acute consolidative airspace disease, no pleural effusions, no pneumothorax and no lymphadenopathy. Visualized portions of the upper abdomen are unremarkable. There are no aggressive appearing lytic or blastic lesions noted in the visualized portions of the skeleton. IMPRESSION: 1. There appears to be a nonocclusive likely chronic thrombus in a segmental sized branch to the anterobasal segment of the left lower lobe. Electronically Signed: By: Vinnie Langton M.D. On: 08/01/2019 14:21   CT CORONARY FRACTIONAL FLOW RESERVE DATA PREP  Result Date: 08/02/2019 EXAM: CT FFR ANALYSIS CLINICAL DATA:  Chest pain FINDINGS: FFRct analysis was performed on the original cardiac CT angiogram dataset. Diagrammatic representation of the FFRct analysis is provided in a separate PDF document in PACS. This dictation was created using the PDF document and an interactive 3D model of the  results. 3D model is not available in the EMR/PACS. Normal FFR range is >0.80. 1. Left Main: 0.99; no significant stenosis. 2. Proximal LAD: 0.73; significant stenosis. 3. LCX: 0.81; no significant stenosis. 4. OM1: 0.90; no significant stenosis. 5. Proximal RCA: <0.50; significant stenosis. IMPRESSION: 1. 2-vessel obstructive CAD including proximal LAD and proximal RCA. Cardiac catheterization is recommended. Eleonore Chiquito, MD Electronically Signed   By: Eleonore Chiquito   On: 08/02/2019 07:22   CT CORONARY FRACTIONAL FLOW RESERVE FLUID ANALYSIS  Result Date: 08/02/2019 EXAM: CT FFR ANALYSIS CLINICAL DATA:  Chest pain FINDINGS: FFRct analysis was performed on the original cardiac CT angiogram dataset. Diagrammatic representation of the FFRct analysis is provided in a separate PDF document in PACS. This dictation was created using the PDF document and an interactive 3D model of the results. 3D model is not available in the EMR/PACS. Normal FFR range is >0.80. 1. Left Main: 0.99; no significant stenosis. 2. Proximal LAD: 0.73; significant stenosis. 3. LCX: 0.81; no significant stenosis. 4. OM1: 0.90; no significant stenosis. 5. Proximal RCA: <0.50; significant stenosis. IMPRESSION: 1. 2-vessel obstructive CAD including proximal LAD and proximal RCA. Cardiac catheterization is recommended. Eleonore Chiquito, MD Electronically Signed   By: Eleonore Chiquito   On: 08/02/2019 07:22    Disposition   Pt is being discharged home today in good condition.  Follow-up Plans & Appointments     Follow-up Information     Lorretta Harp, MD Follow up.   Specialties: Cardiology, Radiology Why: Follow-up scheduled for 08/27/2019 at 3:15pm. Please arrive 15 minutes early for check-in. If this date/time does not work for you, please call our office to reschedule. Contact information: [address] [place] [address] [phone]                Discharge Instructions  Amb Referral to Cardiac  Rehabilitation   Complete by: As directed    Diagnosis: Coronary Stents   After initial evaluation and assessments completed: Virtual Based Care may be provided alone or in conjunction with Phase 2 Cardiac Rehab based on patient barriers.: Yes   Diet - low sodium heart healthy   Complete by: As directed    Increase activity slowly   Complete by: As directed         Discharge Medications   Allergies as of 08/15/2019       Reactions   Penicillins    Swelling Has patient had a PCN reaction causing immediate rash, facial/tongue/throat swelling, SOB or lightheadedness with hypotension: YES Has patient had a PCN reaction causing severe rash involving mucus membranes or skin necrosis: NO Has patient had a PCN reaction that required hospitalization NO Has patient had a PCN reaction occurring within the last 10 years: NO If all of the above answers are "NO", then may proceed with Cephalosporin use.   Clindamycin/lincomycin Rash        Medication List     TAKE these medications    aspirin EC 81 MG tablet Take 81 mg by mouth daily. Swallow whole.   atorvastatin 80 MG tablet Commonly known as: LIPITOR Take 1 tablet (80 mg total) by mouth daily.   B-D UF III MINI PEN NEEDLES 31G X 5 MM Misc Generic drug: Insulin Pen Needle USE TO INJECT LANTUS DAILY AS DIRECTED   clopidogrel 75 MG tablet Commonly known as: Plavix Take 1 tablet (75 mg total) by mouth daily.   cyanocobalamin 1000 MCG tablet Take 1,000 mcg by mouth daily.   Farxiga 10 MG Tabs tablet Generic drug: dapagliflozin propanediol Take 10 mg by mouth daily.   insulin glargine 100 UNIT/ML injection Commonly known as: LANTUS Inject 13 Units into the skin at bedtime.   latanoprost 0.005 % ophthalmic solution Commonly known as: XALATAN Place 1 drop into both eyes at bedtime.   lisinopril 10 MG tablet Commonly known as: ZESTRIL Take 10 mg by mouth daily.   loratadine 10 MG tablet Commonly known as:  CLARITIN Take 10 mg by mouth daily.   metFORMIN 500 MG tablet Commonly known as: GLUCOPHAGE Take 500 mg by mouth 2 (two) times daily with a meal. Notes to patient: Restart 48 hours after cardiac catheterization (evening of 08/17/2019).   multivitamin with minerals Tabs tablet Take 1 tablet by mouth daily.   nitroGLYCERIN 0.4 MG SL tablet Commonly known as: Nitrostat Place 1 tablet (0.4 mg total) under the tongue every 5 (five) minutes as needed for chest pain.           Allergies Allergies  Allergen Reactions   Penicillins     Swelling Has patient had a PCN reaction causing immediate rash, facial/tongue/throat swelling, SOB or lightheadedness with hypotension: YES Has patient had a PCN reaction causing severe rash involving mucus membranes or skin necrosis: NO Has patient had a PCN reaction that required hospitalization NO Has patient had a PCN reaction occurring within the last 10 years: NO If all of the above answers are "NO", then may proceed with Cephalosporin use.   Clindamycin/Lincomycin Rash    Outstanding Labs/Studies   N/A.  Duration of Discharge Encounter   Greater than 30 minutes including physician time.  Signed, Corrin Parker, PA-C 08/15/2019, 5:54 PM   Agree with note by Marjie Skiff, PA-C  S/P successful 2 V intervention with PCI/DES RCA and CSI/PCI DES LAD performed radially. I  implanted 4 DES (1 RCA, 3 LAD). Pt stable for same day DC home on DAPT. ROV with me 2 weeks.    Lorretta Harp, M.D., [place], [facility], [place] [address] [place], [place]  [address]  [phone] 08/16/2019 6:40 AM

## 2019-08-15 NOTE — Progress Notes (Signed)
CARDIAC REHAB PHASE I   Stent education completed with pt. Pt educated on importance of ASA, Plavix, and NTG. Pt given stent cards along with heart healthy and diabetic diets. Reviewed restrictions, site care, and exercise guidelines. Will refer to CRP II [place]. Pt is interested in participating in Virtual Cardiac and Pulmonary Rehab. Pt advised that Virtual Cardiac and Pulmonary Rehab is provided at no cost to the patient.  Checklist:  1. Pt has smart device  ie smartphone and/or ipad for downloading an app  Yes 2. Reliable internet/wifi service    Yes 3. Understands how to use their smartphone and navigate within an app.  Yes  Pt verbalized understanding and is in agreement.  [id number] Reynold Bowen, RN BSN 08/15/2019 1:45 PM

## 2019-08-15 NOTE — Telephone Encounter (Addendum)
Left a detailed message for the patient of her lab results and to give out office a call if she has any questions.  ----- Message from Runell Gess, MD sent at 08/11/2019  2:43 PM EDT ----- Labs ok for Cor angio

## 2019-08-15 NOTE — Interval H&P Note (Signed)
Cath Lab Visit (complete for each Cath Lab visit)  Clinical Evaluation Leading to the Procedure:   ACS: No.  Non-ACS:    Anginal Classification: CCS II  Anti-ischemic medical therapy: No Therapy  Non-Invasive Test Results: No non-invasive testing performed  Prior CABG: No previous CABG      History and Physical Interval Note:  08/15/2019 9:42 AM  Monica Daniels  has presented today for surgery, with the diagnosis of cad.  The various methods of treatment have been discussed with the patient and family. After consideration of risks, benefits and other options for treatment, the patient has consented to  Procedure(s): CORONARY ATHERECTOMY (N/A) as a surgical intervention.  The patient's history has been reviewed, patient examined, no change in status, stable for surgery.  I have reviewed the patient's chart and labs.  Questions were answered to the patient's satisfaction.     [name]

## 2019-08-15 NOTE — Discharge Instructions (Signed)
HOLD METFORMIN// RESTART Sunday 7/25 NEXT DOSE OF PLAVIX IS 7/23 AND DAILY INCREASE FLUIDS FOR 2-3 DAYS TO FLUSH KIDNEYS  Radial Site Care  This sheet gives you information about how to care for yourself after your procedure. Your health care provider may also give you more specific instructions. If you have problems or questions, contact your health care provider. What can I expect after the procedure? After the procedure, it is common to have:  Bruising and tenderness at the catheter insertion area. Follow these instructions at home: Medicines  Take over-the-counter and prescription medicines only as told by your health care provider. Insertion site care  Follow instructions from your health care provider about how to take care of your insertion site. Make sure you: ? Wash your hands with soap and water before you change your bandage (dressing). If soap and water are not available, use hand sanitizer. ? Change your dressing as told by your health care provider. ? Leave stitches (sutures), skin glue, or adhesive strips in place. These skin closures may need to stay in place for 2 weeks or longer. If adhesive strip edges start to loosen and curl up, you may trim the loose edges. Do not remove adhesive strips completely unless your health care provider tells you to do that.  Check your insertion site every day for signs of infection. Check for: ? Redness, swelling, or pain. ? Fluid or blood. ? Pus or a bad smell. ? Warmth.  Do not take baths, swim, or use a hot tub until your health care provider approves.  You may shower 24-48 hours after the procedure, or as directed by your health care provider. ? Remove the dressing and gently wash the site with plain soap and water. ? Pat the area dry with a clean towel. ? Do not rub the site. That could cause bleeding.  Do not apply powder or lotion to the site. Activity   For 24 hours after the procedure, or as directed by your health  care provider: ? Do not flex or bend the affected arm. ? Do not push or pull heavy objects with the affected arm. ? Do not drive yourself home from the hospital or clinic. You may drive 24 hours after the procedure unless your health care provider tells you not to. ? Do not operate machinery or power tools.  Do not lift anything that is heavier than 10 lb (4.5 kg), or the limit that you are told, until your health care provider says that it is safe.  Ask your health care provider when it is okay to: ? Return to work or school. ? Resume usual physical activities or sports. ? Resume sexual activity. General instructions  If the catheter site starts to bleed, raise your arm and put firm pressure on the site. If the bleeding does not stop, get help right away. This is a medical emergency.  If you went home on the same day as your procedure, a responsible adult should be with you for the first 24 hours after you arrive home.  Keep all follow-up visits as told by your health care provider. This is important. Contact a health care provider if:  You have a fever.  You have redness, swelling, or yellow drainage around your insertion site. Get help right away if:  You have unusual pain at the radial site.  The catheter insertion area swells very fast.  The insertion area is bleeding, and the bleeding does not stop when you hold steady  pressure on the area.  Your arm or hand becomes pale, cool, tingly, or numb. These symptoms may represent a serious problem that is an emergency. Do not wait to see if the symptoms will go away. Get medical help right away. Call your local emergency services (911 in the [place] Do not drive yourself to the hospital. Summary  After the procedure, it is common to have bruising and tenderness at the site.  Follow instructions from your health care provider about how to take care of your radial site wound. Check the wound every day for signs of infection.  Do  not lift anything that is heavier than 10 lb (4.5 kg), or the limit that you are told, until your health care provider says that it is safe. This information is not intended to replace advice given to you by your health care provider. Make sure you discuss any questions you have with your health care provider. Document Revised: 02/15/2017 Document Reviewed: 02/15/2017 Elsevier Patient Education  2020 [employer].

## 2019-08-16 ENCOUNTER — Encounter (HOSPITAL_COMMUNITY): Payer: Self-pay | Admitting: Cardiovascular Disease

## 2019-08-19 DIAGNOSIS — I10 Essential (primary) hypertension: Secondary | ICD-10-CM | POA: Diagnosis not present

## 2019-08-19 DIAGNOSIS — E7800 Pure hypercholesterolemia, unspecified: Secondary | ICD-10-CM | POA: Diagnosis not present

## 2019-08-19 DIAGNOSIS — G4733 Obstructive sleep apnea (adult) (pediatric): Secondary | ICD-10-CM | POA: Diagnosis not present

## 2019-08-19 DIAGNOSIS — E1142 Type 2 diabetes mellitus with diabetic polyneuropathy: Secondary | ICD-10-CM | POA: Diagnosis not present

## 2019-08-20 ENCOUNTER — Encounter: Payer: Self-pay | Admitting: Podiatry

## 2019-08-20 ENCOUNTER — Other Ambulatory Visit: Payer: Self-pay

## 2019-08-20 ENCOUNTER — Ambulatory Visit: Payer: Federal, State, Local not specified - PPO | Admitting: Podiatry

## 2019-08-20 DIAGNOSIS — E1151 Type 2 diabetes mellitus with diabetic peripheral angiopathy without gangrene: Secondary | ICD-10-CM

## 2019-08-20 DIAGNOSIS — L97511 Non-pressure chronic ulcer of other part of right foot limited to breakdown of skin: Secondary | ICD-10-CM

## 2019-08-20 DIAGNOSIS — I739 Peripheral vascular disease, unspecified: Secondary | ICD-10-CM | POA: Diagnosis not present

## 2019-08-21 ENCOUNTER — Telehealth (HOSPITAL_COMMUNITY): Payer: Self-pay

## 2019-08-21 NOTE — Telephone Encounter (Signed)
Pt insurance is active and benefits verified through [place]. Co-pay $0.00, DED $350.00/$350.00 met, out of pocket $5,000.00/$2,302.80 met, co-insurance 35%. No pre-authorization required. Passport, 08/21/19 @ 2:21PM, JWW#[id number]  Will contact patient to see if she is interested in the Cardiac Rehab Program. If interested, patient will need to complete follow up appt. Once completed, patient will be contacted for scheduling upon review by the RN Navigator.

## 2019-08-21 NOTE — Telephone Encounter (Signed)
Called patient to see if she is interested in the Cardiac Rehab Program. Patient expressed interest. Explained scheduling process and went over insurance, patient verbalized understanding. Will contact patient for scheduling once f/u has been completed. 

## 2019-08-21 NOTE — Progress Notes (Signed)
Subjective: 59 year old female presents the office today for Evaluation of a wound on the right foot submetatarsal 1.  She has been under the care of Dr. Al Corpus for this.  She states the wound is healed and she has not seen any swelling or redness or any drainage.  She has no new concerns today no other open sores. Denies any systemic complaints such as fevers, chills, nausea, vomiting. No acute changes since last appointment, and no other complaints at this time.   Objective: AAO x3, NAD DP/PT pulses palpable bilaterally, CRT less than 3 seconds On the right foot submetatarsal 1 is a preulcerative callus but appears to the wound is healed today.  There is no open sore identified there is no surrounding erythema, ascending cellulitis.  There is no fluctuation or crepitation peer there is no malodor. No pain with calf compression, swelling, warmth, erythema  Assessment: Preulcerative callus right foot  Plan: -All treatment options discussed with the patient including all alternatives, risks, complications.  -Very minimal hyperkeratotic tissue today.  Recommend a small meta moisturizer to the area daily as well as offloading.  Dispensed offloading pads.  Also like for her to follow-up with Raiford Noble for an insert to help offload. -Discussed daily foot inspection -Patient encouraged to call the office with any questions, concerns, change in symptoms.   Vivi Barrack DPM

## 2019-08-26 DIAGNOSIS — Z79899 Other long term (current) drug therapy: Secondary | ICD-10-CM | POA: Diagnosis not present

## 2019-08-26 DIAGNOSIS — Z6841 Body Mass Index (BMI) 40.0 and over, adult: Secondary | ICD-10-CM | POA: Diagnosis not present

## 2019-08-26 DIAGNOSIS — E119 Type 2 diabetes mellitus without complications: Secondary | ICD-10-CM | POA: Diagnosis not present

## 2019-08-26 DIAGNOSIS — I10 Essential (primary) hypertension: Secondary | ICD-10-CM | POA: Diagnosis not present

## 2019-08-26 DIAGNOSIS — I2510 Atherosclerotic heart disease of native coronary artery without angina pectoris: Secondary | ICD-10-CM | POA: Diagnosis not present

## 2019-08-26 DIAGNOSIS — E1165 Type 2 diabetes mellitus with hyperglycemia: Secondary | ICD-10-CM | POA: Diagnosis not present

## 2019-08-26 DIAGNOSIS — Z794 Long term (current) use of insulin: Secondary | ICD-10-CM | POA: Diagnosis not present

## 2019-08-26 DIAGNOSIS — Z09 Encounter for follow-up examination after completed treatment for conditions other than malignant neoplasm: Secondary | ICD-10-CM | POA: Diagnosis not present

## 2019-08-27 ENCOUNTER — Ambulatory Visit: Payer: Federal, State, Local not specified - PPO | Admitting: Cardiovascular Disease

## 2019-08-27 ENCOUNTER — Encounter: Payer: Self-pay | Admitting: Cardiovascular Disease

## 2019-08-27 ENCOUNTER — Other Ambulatory Visit: Payer: Self-pay

## 2019-08-27 DIAGNOSIS — I25118 Atherosclerotic heart disease of native coronary artery with other forms of angina pectoris: Secondary | ICD-10-CM | POA: Diagnosis not present

## 2019-08-27 DIAGNOSIS — E782 Mixed hyperlipidemia: Secondary | ICD-10-CM

## 2019-08-27 DIAGNOSIS — I10 Essential (primary) hypertension: Secondary | ICD-10-CM

## 2019-08-27 DIAGNOSIS — I2510 Atherosclerotic heart disease of native coronary artery without angina pectoris: Secondary | ICD-10-CM | POA: Insufficient documentation

## 2019-08-27 DIAGNOSIS — I739 Peripheral vascular disease, unspecified: Secondary | ICD-10-CM

## 2019-08-27 NOTE — Assessment & Plan Note (Signed)
History of essential hypertension a blood pressure measured today 132/76.  She is on lisinopril.  ons

## 2019-08-27 NOTE — Assessment & Plan Note (Signed)
History of CAD status post coronary CTA with FFR analysis performed 08/01/2019 revealing at least two-vessel disease.  Based on this I performed outpatient diagnostic coronary angiography on her 08/12/2019 revealing severe disease in the proximal RCA, proximal mid LAD.  3 days later I performed RCA stenting, LAD orbital atherectomy, PCI and stenting of the proximal mid LAD with excellent result.  She does have normal LV function.  She is on aspirin Plavix.  She has had no subsequent episodes of chest pain and feels like she has more energy.

## 2019-08-27 NOTE — Assessment & Plan Note (Signed)
Monica Daniels was initially referred to me for PAD.  Dopplers performed 06/06/2019 revealed a right ABI 0.75 and a left of 0.82 with a high-frequency signal in her distal right SFA.  She was complaining of right calf claudication.  Given her recent coronary invention she is prefers to delay invasive evaluation for several months.  She is supposed to participate in cardiac rehab.  If she notices worsening discomfort she will call me sooner we will address this in a more timely fashion.

## 2019-08-27 NOTE — Patient Instructions (Addendum)
Medication Instructions:  Your physician recommends that you continue on your current medications as directed. Please refer to the Current Medication list given to you today.  *If you need a refill on your cardiac medications before your next appointment, please call your pharmacy*  Lab Work: NONE ordered at this time of appointment   If you have labs (blood work) drawn today and your tests are completely normal, you will receive your results only by:  MyChart Message (if you have MyChart) OR  A paper copy in the mail If you have any lab test that is abnormal or we need to change your treatment, we will call you to review the results.  Testing/Procedures: Your physician has requested that you have a lower extremity arterial exercise duplex. During this test, exercise and ultrasound are used to evaluate arterial blood flow in the legs. Allow one hour for this exam. There are no restrictions or special instructions.   Please schedule for 6 months prior to to 6 month follow up  Follow-Up: At [facility], you and your health needs are our priority.  As part of our continuing mission to provide you with exceptional heart care, we have created designated Provider Care Teams.  These Care Teams include your primary Cardiologist (physician) and Advanced Practice Providers (APPs -  Physician Assistants and Nurse Practitioners) who all work together to provide you with the care you need, when you need it.   Your next appointment:   6 month(s)  The format for your next appointment:   In Person  Provider:   [name], MD  Other Instructions  You have been referred to LIPID CLINIC with Pharmacy

## 2019-08-27 NOTE — Assessment & Plan Note (Signed)
Patient hyperlipidemia on high-dose atorvastatin with lipid profile performed 07/24/2019 revealing total cholesterol 172, LDL 100 and HDL 58.  I am going to refer her to the Pharm.D. for initiation of Repatha.

## 2019-08-27 NOTE — Progress Notes (Signed)
08/27/2019 Monica Daniels   19-Oct-1960  [id number]  Primary Physician Shon Hale, MD Primary Cardiologist: Runell Gess MD Nicholes Calamity, [place]  HPI:  Monica Daniels is a 59 y.o.  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 She is referred by her podiatrist, Dr. Ernestene Kiel, for peripheral vascular valuation because of an ulcer on her right first metatarsal head.  I last saw her in the office 7/9.Her cardiac risk factors are notable for treated hypertension, diabetes and hyperlipidemia. Unfortunately her hemoglobin A1c's have remained above 8. She does have a family history for heart disease in his mother who had CABG. Her mother, Monica Daniels, was a patient of mine as well who died in 04/06/2018. She is never had a heart attack or stroke. She denies chest pain or shortness of breath,but does sometimes have burning in her chest which she is attributed to reflux. She does not exercise much but denies claudication as well.  Her wound on her right great toe has since healed.  Her Doppler studies performed 06/06/2019 revealed a right ABI 0.75 and a left of 0.82 with a high-frequency signal in her distal right SFA as well as tibial vessel disease.  She does complain now of right calf claudication.  Because of her complaints of chest pain I performed a coronary CTA with FFR analysis 08/01/2019 suggesting at least two-vessel disease.  Based on this I recommend that we proceed with outpatient diagnostic coronary angiography.  I have asked her to begin taking a baby as  Outpatient cardiac cath on 08/12/2019 revealing severe proximal RCA, proximal mid LAD diseasepirin..  3 days later after performed outpatient RCA stenting, proximal LAD orbital atherectomy, PCI and stenting as well as stenting of her mid LAD.  She is done well since then feels clinically improved.  She denies chest pain or  shortness of breath.   Current Meds  Medication Sig  . aspirin EC 81 MG tablet Take 81 mg by mouth daily. Swallow whole.  [name] atorvastatin (LIPITOR) 80 MG tablet Take 1 tablet (80 mg total) by mouth daily.  . B-D UF III MINI PEN NEEDLES 31G X 5 MM MISC USE TO INJECT LANTUS DAILY AS DIRECTED  . clopidogrel (PLAVIX) 75 MG tablet Take 1 tablet (75 mg total) by mouth daily.  . cyanocobalamin 1000 MCG tablet Take 1,000 mcg by mouth daily.   [name] FARXIGA 10 MG TABS tablet Take 10 mg by mouth daily.  . insulin glargine (LANTUS) 100 UNIT/ML injection Inject 13 Units into the skin at bedtime.   [name] latanoprost (XALATAN) 0.005 % ophthalmic solution Place 1 drop into both eyes at bedtime.   [name] lisinopril (ZESTRIL) 10 MG tablet Take 10 mg by mouth daily.  [name] loratadine (CLARITIN) 10 MG tablet Take 10 mg by mouth daily.  . metFORMIN (GLUCOPHAGE) 500 MG tablet Take 500 mg by mouth 2 (two) times daily with a meal.  . Multiple Vitamin (MULTIVITAMIN WITH MINERALS) TABS tablet Take 1 tablet by mouth daily.  . nitroGLYCERIN (NITROSTAT) 0.4 MG SL tablet Place 1 tablet (0.4 mg total) under the tongue every 5 (five) minutes as needed for chest pain.     Allergies  Allergen Reactions  . Penicillins     Swelling Has patient had a PCN reaction causing immediate rash, facial/tongue/throat swelling, SOB or lightheadedness with hypotension: YES Has patient had a PCN reaction causing severe rash  involving mucus membranes or skin necrosis: NO Has patient had a PCN reaction that required hospitalization NO Has patient had a PCN reaction occurring within the last 10 years: NO If all of the above answers are "NO", then may proceed with Cephalosporin use.  . Clindamycin/Lincomycin Rash    Social History   Socioeconomic History  . Marital status: Single    Spouse name: Not on file  . Number of children: Not on file  . Years of education: Not on file  . Highest education level: Not on file  Occupational History  . Not on  file  Tobacco Use  . Smoking status: Never Smoker  . Smokeless tobacco: Never Used  Substance and Sexual Activity  . Alcohol use: No  . Drug use: No  . Sexual activity: Not on file  Other Topics Concern  . Not on file  Social History Narrative  . Not on file   Social Determinants of Health   Financial Resource Strain:   . Difficulty of Paying Living Expenses:   Food Insecurity:   . Worried About Programme researcher, broadcasting/film/video in the Last Year:   . Barista in the Last Year:   Transportation Needs:   . Freight forwarder (Medical):   [name] Lack of Transportation (Non-Medical):   Physical Activity:   . Days of Exercise per Week:   . Minutes of Exercise per Session:   Stress:   . Feeling of Stress :   Social Connections:   . Frequency of Communication with Friends and Family:   . Frequency of Social Gatherings with Friends and Family:   . Attends Religious Services:   . Active Member of Clubs or Organizations:   . Attends Banker Meetings:   [name] Marital Status:   Intimate Partner Violence:   . Fear of Current or Ex-Partner:   . Emotionally Abused:   [name] Physically Abused:   . Sexually Abused:      Review of Systems: General: negative for chills, fever, night sweats or weight changes.  Cardiovascular: negative for chest pain, dyspnea on exertion, edema, orthopnea, palpitations, paroxysmal nocturnal dyspnea or shortness of breath Dermatological: negative for rash Respiratory: negative for cough or wheezing Urologic: negative for hematuria Abdominal: negative for nausea, vomiting, diarrhea, bright red blood per rectum, melena, or hematemesis Neurologic: negative for visual changes, syncope, or dizziness All other systems reviewed and are otherwise negative except as noted above.    Blood pressure 132/76, pulse 76, height 5\' 6"  (1.676 m), weight 263 lb (119.3 kg), last menstrual period 12/31/2012, SpO2 100 %.  General appearance: alert and no distress Neck: no  adenopathy, no carotid bruit, no JVD, supple, symmetrical, trachea midline and thyroid not enlarged, symmetric, no tenderness/mass/nodules Lungs: clear to auscultation bilaterally Heart: regular rate and rhythm, S1, S2 normal, no murmur, click, rub or gallop Extremities: extremities normal, atraumatic, no cyanosis or edema Pulses: 2+ and symmetric Skin: Skin color, texture, turgor normal. No rashes or lesions Neurologic: Alert and oriented X 3, normal strength and tone. Normal symmetric reflexes. Normal coordination and gait  EKG sinus rhythm at 76 with nonspecific ST and T wave changes. I  Personally reviewed this EKG.  ASSESSMENT AND PLAN:   HLD (hyperlipidemia) Patient hyperlipidemia on high-dose atorvastatin with lipid profile performed 07/24/2019 revealing total cholesterol 172, LDL 100 and HDL 58.  I am going to refer her to the Pharm.D. for initiation of Repatha.  Essential hypertension History of essential hypertension a blood pressure measured  today 132/76.  She is on lisinopril.  ons  Coronary artery disease History of CAD status post coronary CTA with FFR analysis performed 08/01/2019 revealing at least two-vessel disease.  Based on this I performed outpatient diagnostic coronary angiography on her 08/12/2019 revealing severe disease in the proximal RCA, proximal mid LAD.  3 days later I performed RCA stenting, LAD orbital atherectomy, PCI and stenting of the proximal mid LAD with excellent result.  She does have normal LV function.  She is on aspirin Plavix.  She has had no subsequent episodes of chest pain and feels like she has more energy.  Peripheral arterial disease [facility]) Ms. Wanat was initially referred to me for PAD.  Dopplers performed 06/06/2019 revealed a right ABI 0.75 and a left of 0.82 with a high-frequency signal in her distal right SFA.  She was complaining of right calf claudication.  Given her recent coronary invention she is prefers to delay invasive evaluation for  several months.  She is supposed to participate in cardiac rehab.  If she notices worsening discomfort she will call me sooner we will address this in a more timely fashion.      Runell Gess MD FACP,FACC,FAHA, [facility] 08/27/2019 4:42 PM

## 2019-08-29 ENCOUNTER — Ambulatory Visit (INDEPENDENT_AMBULATORY_CARE_PROVIDER_SITE_OTHER): Payer: Federal, State, Local not specified - PPO | Admitting: Pharmacist Clinician (PhC)/ Clinical Pharmacy Specialist

## 2019-08-29 ENCOUNTER — Other Ambulatory Visit: Payer: Self-pay

## 2019-08-29 DIAGNOSIS — E782 Mixed hyperlipidemia: Secondary | ICD-10-CM | POA: Diagnosis not present

## 2019-08-29 MED ORDER — EZETIMIBE 10 MG PO TABS
10.0000 mg | ORAL_TABLET | Freq: Every day | ORAL | 3 refills | Status: DC
Start: 2019-08-29 — End: 2020-09-15

## 2019-08-29 NOTE — Patient Instructions (Addendum)
Your Results:             Your most recent labs Goal  Total Cholesterol 170 < 200  Triglycerides 75 < 150  HDL (happy/good cholesterol) 58 > 40  LDL (lousy/bad cholesterol 100 < 70     Medication changes:  Start ezetimibe 10 mg once daily - mornings or evening  Continue with atorvastatin 80 mg once daily  Lab orders:  We will send you a lab order in about 8-10 weeks to repeat blood work - end of October/early November  If you have any questions or concerns, please call Aarik Blank/Raquel at [phone]  Thank you for choosing [facility]

## 2019-08-29 NOTE — Progress Notes (Signed)
08/30/2019 Monica Daniels 1960-07-21 [id number]   HPI:  Monica Daniels is a 59 y.o. female patient of Dr Allyson Sabal, who presents today for a lipid clinic evaluation.  See pertinent past medical history below.  She was seen by Dr. Allyson Sabal earlier this month after a heart cath in July with stents to RCA and LAD.  Lipid labs drawn just a couple of weeks prior to this, showed her to have an LDL of 100, on atorvastatin 40.  At the time of her heart cath the dose was increased to 80 mg.  She has been on this only for about 2-3 weeks.    Past Medical History: hypertension Well controlled on lisinopril 10 mg daily  CAD Cath 08/14/19 - PCI and DES   DM2 A1c 8.5 (08/2019) - on dapagliflozin 10 mg, Lantus qd, metformin 500 mg bid    Current Medications: atorvastatin 80 mg (increased from 40 mg daily July 21)  Cholesterol Goals: LDL< 70   Intolerant/previously tried: none - currently on atorvastatin 80  Family history: mother with heart disease, was pt of Dr. Allyson Sabal  Diet: more home cooked meals, no pork, rare red meat; more vegetables  Labs: 6/21:  TC 172, TG 75, HLD 58, LDL 100   Current Outpatient Medications  Medication Sig Dispense Refill   aspirin EC 81 MG tablet Take 81 mg by mouth daily. Swallow whole.     atorvastatin (LIPITOR) 80 MG tablet Take 1 tablet (80 mg total) by mouth daily. 90 tablet 1   B-D UF III MINI PEN NEEDLES 31G X 5 MM MISC USE TO INJECT LANTUS DAILY AS DIRECTED     clopidogrel (PLAVIX) 75 MG tablet Take 1 tablet (75 mg total) by mouth daily. 30 tablet 11   cyanocobalamin 1000 MCG tablet Take 1,000 mcg by mouth daily.      ezetimibe (ZETIA) 10 MG tablet Take 1 tablet (10 mg total) by mouth daily. 90 tablet 3   FARXIGA 10 MG TABS tablet Take 10 mg by mouth daily.     insulin glargine (LANTUS) 100 UNIT/ML injection Inject 13 Units into the skin at bedtime.      latanoprost (XALATAN) 0.005 % ophthalmic solution Place 1 drop into both eyes at bedtime.      lisinopril (ZESTRIL)  10 MG tablet Take 10 mg by mouth daily.     loratadine (CLARITIN) 10 MG tablet Take 10 mg by mouth daily.     metFORMIN (GLUCOPHAGE) 500 MG tablet Take 500 mg by mouth 2 (two) times daily with a meal.     Multiple Vitamin (MULTIVITAMIN WITH MINERALS) TABS tablet Take 1 tablet by mouth daily.     nitroGLYCERIN (NITROSTAT) 0.4 MG SL tablet Place 1 tablet (0.4 mg total) under the tongue every 5 (five) minutes as needed for chest pain. 25 tablet 2   No current facility-administered medications for this visit.    Allergies  Allergen Reactions   Penicillins     Swelling Has patient had a PCN reaction causing immediate rash, facial/tongue/throat swelling, SOB or lightheadedness with hypotension: YES Has patient had a PCN reaction causing severe rash involving mucus membranes or skin necrosis: NO Has patient had a PCN reaction that required hospitalization NO Has patient had a PCN reaction occurring within the last 10 years: NO If all of the above answers are "NO", then may proceed with Cephalosporin use.   Clindamycin/Lincomycin Rash    Past Medical History:  Diagnosis Date   Diabetes mellitus without complication (HCC)  Hypertension     Last menstrual period 12/31/2012.   HLD (hyperlipidemia) Patient with elevated LDL cholesterol, new goal to be < 70 due to recent stenting.  Patient currently tolerating atorvastatin 80 mg daily, having started about 2-3 weeks ago.  Reviewed options for lowering LDL cholesterol, including ezetimibe, PCSK-9 inhibitors and bempedoic acid.  Discussed mechanisms of action, dosing, side effects and potential decreases in LDL cholesterol.  Answered all patient questions.  Based on this information, patient would prefer to start ezetimibe 10 mg daily.  With the increase in atorvastatin dose since her last labs, and the addition of ezetimibe, patient should be able to easily get to goal of < 70 with combination.  Will repeat labs in 3 months.  At that time,  if not at goal, will consider addition of PCSK-9 to her regimen.     Phillips Hay PharmD CPP [facility] [address] [place], [place] [address] [phone]

## 2019-08-30 ENCOUNTER — Telehealth (HOSPITAL_COMMUNITY): Payer: Self-pay

## 2019-08-30 ENCOUNTER — Encounter: Payer: Self-pay | Admitting: Pharmacist Clinician (PhC)/ Clinical Pharmacy Specialist

## 2019-08-30 NOTE — Telephone Encounter (Signed)
Called patient to see if she was interested in participating in the Cardiac Rehab Program. Patient stated yes. Patient will come in for orientation on 09/19/19 @ 10AM and will attend the 9AM exercise class.  Mailed letter.

## 2019-08-30 NOTE — Assessment & Plan Note (Signed)
Patient with elevated LDL cholesterol, new goal to be < 70 due to recent stenting.  Patient currently tolerating atorvastatin 80 mg daily, having started about 2-3 weeks ago.  Reviewed options for lowering LDL cholesterol, including ezetimibe, PCSK-9 inhibitors and bempedoic acid.  Discussed mechanisms of action, dosing, side effects and potential decreases in LDL cholesterol.  Answered all patient questions.  Based on this information, patient would prefer to start ezetimibe 10 mg daily.  With the increase in atorvastatin dose since her last labs, and the addition of ezetimibe, patient should be able to easily get to goal of < 70 with combination.  Will repeat labs in 3 months.  At that time, if not at goal, will consider addition of PCSK-9 to her regimen.

## 2019-09-04 ENCOUNTER — Ambulatory Visit: Payer: Federal, State, Local not specified - PPO | Admitting: General Practice

## 2019-09-07 ENCOUNTER — Other Ambulatory Visit: Payer: Self-pay | Admitting: Cardiovascular Disease

## 2019-09-10 ENCOUNTER — Telehealth (HOSPITAL_COMMUNITY): Payer: Self-pay | Admitting: Student-PharmD

## 2019-09-10 NOTE — Telephone Encounter (Signed)
Cardiac Rehab Medication Review by a Pharmacist  Does the patient  feel that his/her medications are working for him/her?  Yes - have seen improvements in blood glucose levels  Has the patient been experiencing any side effects to the medications prescribed?  No side effects  Does the patient measure his/her own blood pressure or blood glucose at home?  yes - checks BG in the morning and night, and occasionally in the day if she remembers. Readings: 90-130 AM 170-210 PM  Also checks BP in the morning and sometimes at night. Typically ranges 120-130/60-70  Does the patient have any problems obtaining medications due to transportation or finances?   No - just received samples of Trulicity at PCP's office. Will pick up next dose of Trulicity at her pharmacy.  Understanding of regimen: good Understanding of indications: good Potential of compliance: good  Yvetta Coder, PharmD PGY1 Acute Care Pharmacy Resident

## 2019-09-17 ENCOUNTER — Encounter (HOSPITAL_COMMUNITY)
Admission: RE | Admit: 2019-09-17 | Discharge: 2019-09-17 | Disposition: A | Discharge status: Home / Self Care | Payer: Federal, State, Local not specified - PPO | Source: Ambulatory Visit | Attending: Cardiovascular Disease | Admitting: Cardiovascular Disease

## 2019-09-17 ENCOUNTER — Ambulatory Visit: Payer: Federal, State, Local not specified - PPO | Admitting: Podiatry

## 2019-09-17 ENCOUNTER — Telehealth (HOSPITAL_COMMUNITY): Payer: Self-pay

## 2019-09-17 ENCOUNTER — Encounter: Payer: Self-pay | Admitting: Podiatry

## 2019-09-17 ENCOUNTER — Other Ambulatory Visit: Payer: Self-pay

## 2019-09-17 DIAGNOSIS — L97511 Non-pressure chronic ulcer of other part of right foot limited to breakdown of skin: Secondary | ICD-10-CM | POA: Diagnosis not present

## 2019-09-17 DIAGNOSIS — I739 Peripheral vascular disease, unspecified: Secondary | ICD-10-CM | POA: Diagnosis not present

## 2019-09-17 DIAGNOSIS — Z955 Presence of coronary angioplasty implant and graft: Secondary | ICD-10-CM | POA: Insufficient documentation

## 2019-09-17 DIAGNOSIS — E1151 Type 2 diabetes mellitus with diabetic peripheral angiopathy without gangrene: Secondary | ICD-10-CM | POA: Diagnosis not present

## 2019-09-17 NOTE — Patient Instructions (Addendum)
Diabetes Mellitus and Foot Care °Foot care is an important part of your health, especially when you have diabetes. Diabetes may cause you to have problems because of poor blood flow (circulation) to your feet and legs, which can cause your skin to: °· Become thinner and drier. °· Break more easily. °· Heal more slowly. °· Peel and crack. °You may also have nerve damage (neuropathy) in your legs and feet, causing decreased feeling in them. This means that you may not notice minor injuries to your feet that could lead to more serious problems. Noticing and addressing any potential problems early is the best way to prevent future foot problems. °How to care for your feet °Foot hygiene °· Wash your feet daily with warm water and mild soap. Do not use hot water. Then, pat your feet and the areas between your toes until they are completely dry. Do not soak your feet as this can dry your skin. °· Trim your toenails straight across. Do not dig under them or around the cuticle. File the edges of your nails with an emery board or nail file. °· Apply a moisturizing lotion or petroleum jelly to the skin on your feet and to dry, brittle toenails. Use lotion that does not contain alcohol and is unscented. Do not apply lotion between your toes. °Shoes and socks °· Wear clean socks or stockings every day. Make sure they are not too tight. Do not wear knee-high stockings since they may decrease blood flow to your legs. °· Wear shoes that fit properly and have enough cushioning. Always look in your shoes before you put them on to be sure there are no objects inside. °· To break in new shoes, wear them for just a few hours a day. This prevents injuries on your feet. °Wounds, scrapes, corns, and calluses °· Check your feet daily for blisters, cuts, bruises, sores, and redness. If you cannot see the bottom of your feet, use a mirror or ask someone for help. °· Do not cut corns or calluses or try to remove them with medicine. °· If you  find a minor scrape, cut, or break in the skin on your feet, keep it and the skin around it clean and dry. You may clean these areas with mild soap and water. Do not clean the area with peroxide, alcohol, or iodine. °· If you have a wound, scrape, corn, or callus on your foot, look at it several times a day to make sure it is healing and not infected. Check for: °? Redness, swelling, or pain. °? Fluid or blood. °? Warmth. °? Pus or a bad smell. °General instructions °· Do not cross your legs. This may decrease blood flow to your feet. °· Do not use heating pads or hot water bottles on your feet. They may burn your skin. If you have lost feeling in your feet or legs, you may not know this is happening until it is too late. °· Protect your feet from hot and cold by wearing shoes, such as at the beach or on hot pavement. °· Schedule a complete foot exam at least once a year (annually) or more often if you have foot problems. If you have foot problems, report any cuts, sores, or bruises to your health care provider immediately. °Contact a health care provider if: °· You have a medical condition that increases your risk of infection and you have any cuts, sores, or bruises on your feet. °· You have an injury that is not   healing.  You have redness on your legs or feet.  You feel burning or tingling in your legs or feet.  You have pain or cramps in your legs and feet.  Your legs or feet are numb.  Your feet always feel cold.  You have pain around a toenail. Get help right away if:  You have a wound, scrape, corn, or callus on your foot and: ? You have pain, swelling, or redness that gets worse. ? You have fluid or blood coming from the wound, scrape, corn, or callus. ? Your wound, scrape, corn, or callus feels warm to the touch. ? You have pus or a bad smell coming from the wound, scrape, corn, or callus. ? You have a fever. ? You have a red line going up your leg. Summary  Check your feet every day  for cuts, sores, red spots, swelling, and blisters.  Moisturize feet and legs daily.  Wear shoes that fit properly and have enough cushioning.  If you have foot problems, report any cuts, sores, or bruises to your health care provider immediately.  Schedule a complete foot exam at least once a year (annually) or more often if you have foot problems. This information is not intended to replace advice given to you by your health care provider. Make sure you discuss any questions you have with your health care provider. Document Revised: 10/03/2018 Document Reviewed: 02/12/2016 Elsevier Patient Education  2020 [employer]  Peripheral Vascular Disease  Peripheral vascular disease (PVD) is a disease of the blood vessels that are not part of your heart and brain. A simple term for PVD is poor circulation. In most cases, PVD narrows the blood vessels that carry blood from your heart to the rest of your body. This can reduce the supply of blood to your arms, legs, and internal organs, like your stomach or kidneys. However, PVD most often affects a persons lower legs and feet. Without treatment, PVD tends to get worse. PVD can also lead to acute ischemic limb. This is when an arm or leg suddenly cannot get enough blood. This is a medical emergency. Follow these instructions at home: Lifestyle  Do not use any products that contain nicotine or tobacco, such as cigarettes and e-cigarettes. If you need help quitting, ask your doctor.  Lose weight if you are overweight. Or, stay at a healthy weight as told by your doctor.  Eat a diet that is low in fat and cholesterol. If you need help, ask your doctor.  Exercise regularly. Ask your doctor for activities that are right for you. General instructions  Take over-the-counter and prescription medicines only as told by your doctor.  Take good care of your feet: ? Wear comfortable shoes that fit well. ? Check your feet often for any cuts or  sores.  Keep all follow-up visits as told by your doctor This is important. Contact a doctor if:  You have cramps in your legs when you walk.  You have leg pain when you are at rest.  You have coldness in a leg or foot.  Your skin changes.  You are unable to get or have an erection (erectile dysfunction).  You have cuts or sores on your feet that do not heal. Get help right away if:  Your arm or leg turns cold, numb, and blue.  Your arms or legs become red, warm, swollen, painful, or numb.  You have chest pain.  You have trouble breathing.  You suddenly have weakness in  your face, arm, or leg.  You become very confused or you cannot speak.  You suddenly have a very bad headache.  You suddenly cannot see. Summary  Peripheral vascular disease (PVD) is a disease of the blood vessels.  A simple term for PVD is poor circulation. Without treatment, PVD tends to get worse.  Treatment may include exercise, low fat and low cholesterol diet, and quitting smoking. This information is not intended to replace advice given to you by your health care provider. Make sure you discuss any questions you have with your health care provider. Document Revised: 12/23/2016 Document Reviewed: 02/18/2016 Elsevier Patient Education  2020 [employer].

## 2019-09-17 NOTE — Telephone Encounter (Signed)
Cardiac Rehab Telephone Note:  Successful telephone encounter to Monica Daniels to confirm Cardiac Rehab orientation appointment for 8/26/21at 10:00. Unfortunately nursing assessment cannot be completed at this time secondary to patient shopping. Patient request call back after 4:00pm today for health assessment. Will follow up with patient at that time per her request.  Haziel Molner E. Suzie Portela RN, BSN [place]. [facility]  Cardiac and Pulmonary Rehabilitation Phone: [phone] Fax: [phone]

## 2019-09-17 NOTE — Progress Notes (Signed)
Cardiac Rehab Telephone Note:  Successful telephone encounter to Monica Daniels to confirm Cardiac Rehab orientation appointment for 09/19/19 at 10:00 am. Nursing assessment completed. Patient questions answered. Instructions for appointment provided. Patient screening for Covid-19 negative.  Racine Erby E. Suzie Portela RN, BSN . [facility]  Cardiac and Pulmonary Rehabilitation Phone: [phone] Fax: [phone]

## 2019-09-18 ENCOUNTER — Telehealth (HOSPITAL_COMMUNITY): Payer: Self-pay

## 2019-09-18 DIAGNOSIS — Z794 Long term (current) use of insulin: Secondary | ICD-10-CM | POA: Diagnosis not present

## 2019-09-18 DIAGNOSIS — E119 Type 2 diabetes mellitus without complications: Secondary | ICD-10-CM | POA: Diagnosis not present

## 2019-09-18 NOTE — Telephone Encounter (Signed)
Cardiac Rehab Note:  Incoming call from [employer] to follow up on updated insurance coverage information for cardiac rehab. [name] is informed that she has met her 350.00 deductible and [phone] of her 5,000.00 max out of pocket. She has 35% coinsurance. Patient verbalized understanding and grateful for the updated insurance information.  Monica Daniels E. Rollene Rotunda RN, BSN [place]. [facility]  Cardiac and Pulmonary Rehabilitation Phone: [phone] Fax: [phone]

## 2019-09-18 NOTE — Telephone Encounter (Signed)
Cardiac Rehab Note:  Unsuccessful telephone call to [employer] to discuss updated insurance coverage information for Cardiac Rehab per patient request. [employer] compliant VM message left requesting call back to [phone] to discuss.  Alvin Rubano E. Suzie Portela RN, BSN [place]. [facility]  Cardiac and Pulmonary Rehabilitation Phone: [phone] Fax: [phone]

## 2019-09-19 ENCOUNTER — Encounter (HOSPITAL_COMMUNITY): Payer: Self-pay

## 2019-09-19 ENCOUNTER — Encounter (HOSPITAL_COMMUNITY)
Admission: RE | Admit: 2019-09-19 | Discharge: 2019-09-19 | Disposition: A | Discharge status: Home / Self Care | Payer: Federal, State, Local not specified - PPO | Source: Ambulatory Visit | Attending: Cardiovascular Disease | Admitting: Cardiovascular Disease

## 2019-09-19 ENCOUNTER — Other Ambulatory Visit: Payer: Self-pay

## 2019-09-19 VITALS — BP 104/62 | HR 85 | Ht 67.0 in | Wt 267.0 lb

## 2019-09-19 DIAGNOSIS — Z955 Presence of coronary angioplasty implant and graft: Secondary | ICD-10-CM

## 2019-09-19 NOTE — Progress Notes (Signed)
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  aspirin 81 MG EC tablet, TAKE 1 TABLET BY MOUTH DAILY. SWALLOW WHOLE., Disp: 30 tablet, Rfl: 12 .  atorvastatin (LIPITOR) 10 MG tablet, Take 10 mg by mouth daily., Disp: , Rfl:  .  B-D UF III MINI PEN NEEDLES 31G X 5 MM MISC, USE TO INJECT LANTUS DAILY AS DIRECTED, Disp: , Rfl:  .  clopidogrel (PLAVIX) 75 MG tablet, Take 1 tablet (75 mg total) by mouth daily., Disp: 30 tablet, Rfl: 11 .  cyanocobalamin 1000 MCG tablet, Take 1,000 mcg by mouth daily. , Disp: , Rfl:  .  Dulaglutide (TRULICITY) 0.75 MG/0.5ML SOPN, Inject 0.75 mg into the skin once a week., Disp: , Rfl:  .  ezetimibe (ZETIA) 10 MG tablet, Take 1 tablet (10 mg total) by mouth daily., Disp: 90 tablet, Rfl: 3 .  FARXIGA 10 MG TABS tablet, Take 10 mg by mouth daily., Disp: , Rfl:  .  insulin glargine (LANTUS) 100 UNIT/ML injection, Inject 13 Units into the skin at bedtime. , Disp: , Rfl:  .  latanoprost (XALATAN) 0.005 % ophthalmic solution, Place 1 drop into both eyes at bedtime. , Disp: , Rfl:  .  lisinopril (ZESTRIL) 10 MG tablet, Take 10 mg by mouth daily., Disp: , Rfl:  .  loratadine (CLARITIN) 10 MG tablet, Take 10 mg by mouth daily., Disp: , Rfl:  .  metFORMIN (GLUCOPHAGE-XR) 500 MG 24 hr tablet, Take 1,000 mg by mouth 2 (two) times daily., Disp: , Rfl:  .  Multiple Vitamin (MULTIVITAMIN WITH MINERALS) TABS tablet, Take 1 tablet by mouth daily., Disp: , Rfl:  .   nitroGLYCERIN (NITROSTAT) 0.4 MG SL tablet, Place 1 tablet (0.4 mg total) under the tongue every 5 (five) minutes as needed for chest pain., Disp: 25 tablet, Rfl: 2 .  ONETOUCH VERIO test strip, 3 (three) times daily., Disp: , Rfl:   Past Medical History: Past Medical History:  Diagnosis Date  . Diabetes mellitus without complication (HCC)   . Hypertension     Tobacco Use: Social History   Tobacco Use  Smoking Status Never Smoker  Smokeless Tobacco Never Used    Labs: Recent Review Advice worker    Labs for ITP Cardiac and Pulmonary Rehab Latest Ref Rng & Units 07/24/2019   Cholestrol 100 - 199 mg/dL 409   LDLCALC 0 - 99 mg/dL 811(B)   HDL >14 mg/dL 58   Trlycerides 0 - 782 mg/dL 75      Capillary Blood Glucose: Lab Results  Component Value Date   GLUCAP 116 (H) 08/15/2019   GLUCAP 140 (H) 08/15/2019   GLUCAP 126 (H) 08/12/2019   GLUCAP 180 (H) 01/02/2016     Exercise Target Goals: Exercise Program Goal: Individual exercise prescription set using results from initial 6 min walk test and THRR while considering  patient's activity barriers and safety.   Exercise Prescription Goal: Starting with aerobic activity 30 plus minutes a day,  3 days per week for initial exercise prescription. Provide home exercise prescription and guidelines that participant acknowledges understanding prior to discharge.  Activity Barriers & Risk Stratification:  Activity Barriers & Cardiac Risk Stratification - 09/19/19 1127      Activity Barriers & Cardiac Risk Stratification   Activity Barriers Other (comment)    Comments right foot plantar fasciitis    Cardiac Risk Stratification Moderate           6 Minute Walk:  6 Minute Walk    Row Name 09/19/19 1120         6 Minute Walk   Phase Initial     Distance 1302 feet     Walk Time 6 minutes     # of Rest Breaks 0     MPH 2.5     METS 2.54     RPE 12     Perceived Dyspnea  0     VO2 Peak 8.9     Symptoms No     Resting HR  85 bpm     Resting BP 104/62     Resting Oxygen Saturation  98 %     Exercise Oxygen Saturation  during 6 min walk 99 %     Max Ex. HR 114 bpm     Max Ex. BP 112/70     2 Minute Post BP 104/64            Oxygen Initial Assessment:   Oxygen Re-Evaluation:   Oxygen Discharge (Final Oxygen Re-Evaluation):   Initial Exercise Prescription:  Initial Exercise Prescription - 09/19/19 1100      Date of Initial Exercise RX and Referring Provider   Date 09/19/19    Referring Provider [name] MD    Expected Discharge Date 11/15/19      NuStep   Level 2    SPM 75    Minutes 15    METs 2.3      Track   Laps 13    Minutes 15    METs 2.5      Prescription Details   Frequency (times per week) 3x    Duration Progress to 10 minutes continuous walking  at current work load and total walking time to 30-45 min      Intensity   THRR 40-80% of Max Heartrate 64-127    Ratings of Perceived Exertion 11-13    Perceived Dyspnea 0-4      Progression   Progression Continue progressive overload as per policy without signs/symptoms or physical distress.      Resistance Training   Training Prescription Yes    Weight 3lbs    Reps 10-15           Perform Capillary Blood Glucose checks as needed.  Exercise Prescription Changes:   Exercise Comments:   Exercise Goals and Review:   Exercise Goals    Row Name 09/19/19 1129             Exercise Goals   Increase Physical Activity Yes       Intervention Provide advice, education, support and counseling about physical activity/exercise needs.;Develop an individualized exercise prescription for aerobic and resistive training based on initial evaluation findings, risk stratification, comorbidities and participant's personal goals.       Expected Outcomes Short Term: Attend rehab on a regular basis to increase amount of physical activity.;Long Term: Add in home exercise to make exercise part of routine and to increase amount  of physical activity.;Long Term: Exercising  regularly at least 3-5 days a week.       Increase Strength and Stamina Yes       Intervention Provide advice, education, support and counseling about physical activity/exercise needs.;Develop an individualized exercise prescription for aerobic and resistive training based on initial evaluation findings, risk stratification, comorbidities and participant's personal goals.       Expected Outcomes Short Term: Perform resistance training exercises routinely during rehab and add in resistance training at home;Short Term: Increase workloads from initial exercise prescription for resistance, speed, and METs.;Long Term: Improve cardiorespiratory fitness, muscular endurance and strength as measured by increased METs and functional capacity ( )       Able to understand and use rate of perceived exertion (RPE) scale Yes       Intervention Provide education and explanation on how to use RPE scale       Expected Outcomes Short Term: Able to use RPE daily in rehab to express subjective intensity level;Long Term:  Able to use RPE to guide intensity level when exercising independently       Knowledge and understanding of Target Heart Rate Range (THRR) Yes       Intervention Provide education and explanation of THRR including how the numbers were predicted and where they are located for reference       Expected Outcomes Short Term: Able to state/look up THRR;Short Term: Able to use daily as guideline for intensity in rehab;Long Term: Able to use THRR to govern intensity when exercising independently       Able to check pulse independently Yes       Intervention Provide education and demonstration on how to check pulse in carotid and radial arteries.;Review the importance of being able to check your own pulse for safety during independent exercise       Expected Outcomes Short Term: Able to explain why pulse checking is important during independent exercise;Long Term: Able  to check pulse independently and accurately       Understanding of Exercise Prescription Yes       Intervention Provide education, explanation, and written materials on patient's individual exercise prescription       Expected Outcomes Short Term: Able to explain program exercise prescription;Long Term: Able to explain home exercise prescription to exercise independently              Exercise Goals Re-Evaluation :    Discharge Exercise Prescription (Final Exercise Prescription Changes):   Nutrition:  Target Goals: Understanding of nutrition guidelines, daily intake of sodium 1500mg , cholesterol 200mg , calories 30% from fat and 7% or less from saturated fats, daily to have 5 or more servings of fruits and vegetables.  Biometrics:  Pre Biometrics - 09/19/19 1126      Pre Biometrics   Height  (1.702 m)    Weight 121.1 kg    Waist Circumference 48 inches    Hip Circumference 54.5 inches    Waist to Hip Ratio 0.88 %    BMI (Calculated) 41.8    Triceps Skinfold 33 mm    % Body Fat 50.9 %    Grip Strength 29 kg    Flexibility 11 in    Single Leg Stand 13.89 seconds            Nutrition Therapy Plan and Nutrition Goals:   Nutrition Assessments:   Nutrition Goals Re-Evaluation:   Nutrition Goals Discharge (Final Nutrition Goals Re-Evaluation):   Psychosocial: Target Goals: Acknowledge presence or absence of significant depression and/or stress, maximize  coping skills, provide positive support system. Participant is able to verbalize types and ability to use techniques and skills needed for reducing stress and depression.  Initial Review & Psychosocial Screening:  Initial Psych Review & Screening - 09/19/19 1223      Initial Review   Current issues with None Identified      Family Dynamics   Good Support System? Yes   Monica Daniels lives alone but has two brothers who live nearby for support     Barriers   Psychosocial barriers to participate in program There  are no identifiable barriers or psychosocial needs.      Screening Interventions   Interventions Encouraged to exercise           Quality of Life Scores:  Quality of Life - 09/19/19 1114      Quality of Life   Select Quality of Life      Quality of Life Scores   Health/Function Pre 25.73 %    Socioeconomic Pre 30 %    Psych/Spiritual Pre 27.43 %    Family Pre 27 %    GLOBAL Pre 27.16 %          Scores of 19 and below usually indicate a poorer quality of life in these areas.  A difference of  2-3 points is a clinically meaningful difference.  A difference of 2-3 points in the total score of the Quality of Life Index has been associated with significant improvement in overall quality of life, self-image, physical symptoms, and general health in studies assessing change in quality of life.  PHQ-9: Recent Review Flowsheet Data    Depression screen [facility] 09/19/2019   Decreased Interest 0   Down, Depressed, Hopeless 0   PHQ - 2 Score 0     Interpretation of Total Score  Total Score Depression Severity:  1-4 = Minimal depression, 5-9 = Mild depression, 10-14 = Moderate depression, 15-19 = Moderately severe depression, 20-27 = Severe depression   Psychosocial Evaluation and Intervention:   Psychosocial Re-Evaluation:   Psychosocial Discharge (Final Psychosocial Re-Evaluation):   Vocational Rehabilitation: Provide vocational rehab assistance to qualifying candidates.   Vocational Rehab Evaluation & Intervention:  Vocational Rehab - 09/19/19 1225      Initial Vocational Rehab Evaluation & Intervention   Assessment shows need for Vocational Rehabilitation No   Kairy is retired and does not need vocational rehab at this time          Education: Education Goals: Education classes will be provided on a weekly basis, covering required topics. Participant will state understanding/return demonstration of topics presented.  Learning Barriers/Preferences:  Learning  Barriers/Preferences - 09/19/19 1115      Learning Barriers/Preferences   Learning Barriers Sight    Learning Preferences Written Material;Skilled Demonstration;Individual Instruction           Education Topics: Hypertension, Hypertension Reduction -Define heart disease and high blood pressure. Discus how high blood pressure affects the body and ways to reduce high blood pressure.   Exercise and Your Heart -Discuss why it is important to exercise, the FITT principles of exercise, normal and abnormal responses to exercise, and how to exercise safely.   Angina -Discuss definition of angina, causes of angina, treatment of angina, and how to decrease risk of having angina.   Cardiac Medications -Review what the following cardiac medications are used for, how they affect the body, and side effects that may occur when taking the medications.  Medications include Aspirin, Beta blockers, calcium channel blockers,  ACE Inhibitors, angiotensin receptor blockers, diuretics, digoxin, and antihyperlipidemics.   Congestive Heart Failure -Discuss the definition of CHF, how to live with CHF, the signs and symptoms of CHF, and how keep track of weight and sodium intake.   Heart Disease and Intimacy -Discus the effect sexual activity has on the heart, how changes occur during intimacy as we age, and safety during sexual activity.   Smoking Cessation / COPD -Discuss different methods to quit smoking, the health benefits of quitting smoking, and the definition of COPD.   Nutrition I: Fats -Discuss the types of cholesterol, what cholesterol does to the heart, and how cholesterol levels can be controlled.   Nutrition II: Labels -Discuss the different components of food labels and how to read food label   Heart Parts/Heart Disease and PAD -Discuss the anatomy of the heart, the pathway of blood circulation through the heart, and these are affected by heart disease.   Stress I: Signs and  Symptoms -Discuss the causes of stress, how stress may lead to anxiety and depression, and ways to limit stress.   Stress II: Relaxation -Discuss different types of relaxation techniques to limit stress.   Warning Signs of Stroke / TIA -Discuss definition of a stroke, what the signs and symptoms are of a stroke, and how to identify when someone is having stroke.   Knowledge Questionnaire Score:  Knowledge Questionnaire Score - 09/19/19 1114      Knowledge Questionnaire Score   Pre Score 19/24           Core Components/Risk Factors/Patient Goals at Admission:  Personal Goals and Risk Factors at Admission - 09/19/19 1116      Core Components/Risk Factors/Patient Goals on Admission    Weight Management Yes;Weight Loss    Intervention Weight Management: Develop a combined nutrition and exercise program designed to reach desired caloric intake, while maintaining appropriate intake of nutrient and fiber, sodium and fats, and appropriate energy expenditure required for the weight goal.;Weight Management: Provide education and appropriate resources to help participant work on and attain dietary goals.;Weight Management/Obesity: Establish reasonable short term and long term weight goals.;Obesity: Provide education and appropriate resources to help participant work on and attain dietary goals.    Admit Weight 266 lb 6.7 oz (120.8 kg)    Goal Weight: Long Term 200 lb (90.7 kg)    Expected Outcomes Short Term: Continue to assess and modify interventions until short term weight is achieved;Long Term: Adherence to nutrition and physical activity/exercise program aimed toward attainment of established weight goal;Weight Loss: Understanding of general recommendations for a balanced deficit meal plan, which promotes 1-2 lb weight loss per week and includes a negative energy balance of [phone] kcal/d;Understanding recommendations for meals to include 15-35% energy as protein, 25-35% energy from fat,  35-60% energy from carbohydrates, less than 200mg  of dietary cholesterol, 20-35 gm of total fiber daily;Understanding of distribution of calorie intake throughout the day with the consumption of 4-5 meals/snacks    Diabetes Yes    Intervention Provide education about signs/symptoms and action to take for hypo/hyperglycemia.;Provide education about proper nutrition, including hydration, and aerobic/resistive exercise prescription along with prescribed medications to achieve blood glucose in normal ranges: Fasting glucose 65-99 mg/dL    Expected Outcomes Short Term: Participant verbalizes understanding of the signs/symptoms and immediate care of hyper/hypoglycemia, proper foot care and importance of medication, aerobic/resistive exercise and nutrition plan for blood glucose control.;Long Term: Attainment of HbA1C < 7%.    Hypertension Yes    Intervention Provide  education on lifestyle modifcations including regular physical activity/exercise, weight management, moderate sodium restriction and increased consumption of fresh fruit, vegetables, and low fat dairy, alcohol moderation, and smoking cessation.;Monitor prescription use compliance.    Expected Outcomes Short Term: Continued assessment and intervention until BP is < 140/87mm HG in hypertensive participants. < 130/13mm HG in hypertensive participants with diabetes, heart failure or chronic kidney disease.;Long Term: Maintenance of blood pressure at goal levels.    Lipids Yes    Intervention Provide education and support for participant on nutrition & aerobic/resistive exercise along with prescribed medications to achieve LDL 70mg , HDL >40mg .    Expected Outcomes Short Term: Participant states understanding of desired cholesterol values and is compliant with medications prescribed. Participant is following exercise prescription and nutrition guidelines.;Long Term: Cholesterol controlled with medications as prescribed, with individualized exercise RX and  with personalized nutrition plan. Value goals: LDL < 70mg , HDL > 40 mg.           Core Components/Risk Factors/Patient Goals Review:    Core Components/Risk Factors/Patient Goals at Discharge (Final Review):    ITP Comments:  ITP Comments    Row Name 09/19/19 1110 09/19/19 1218         ITP Comments Dr. 09/21/19, Medical Director Dr. Armanda Magic MD, Medical Director             Comments: Armanda Magic attended orientation on 09/19/2019 to review rules and guidelines for program.  Completed 6 minute walk test, Intitial ITP, and exercise prescription.  VSS. Telemetry-Sinus Rhythm.  Asymptomatic. Safety measures and social distancing in place per CDC guidelines.09/21/2019, RN,BSN 09/19/2019 12:34 PM

## 2019-09-20 DIAGNOSIS — H5210 Myopia, unspecified eye: Secondary | ICD-10-CM | POA: Diagnosis not present

## 2019-09-20 DIAGNOSIS — H401131 Primary open-angle glaucoma, bilateral, mild stage: Secondary | ICD-10-CM | POA: Diagnosis not present

## 2019-09-20 DIAGNOSIS — E119 Type 2 diabetes mellitus without complications: Secondary | ICD-10-CM | POA: Diagnosis not present

## 2019-09-20 DIAGNOSIS — H524 Presbyopia: Secondary | ICD-10-CM | POA: Insufficient documentation

## 2019-09-20 DIAGNOSIS — H25813 Combined forms of age-related cataract, bilateral: Secondary | ICD-10-CM | POA: Diagnosis not present

## 2019-09-23 ENCOUNTER — Other Ambulatory Visit: Payer: Self-pay

## 2019-09-23 ENCOUNTER — Encounter (HOSPITAL_COMMUNITY)
Admission: RE | Admit: 2019-09-23 | Discharge: 2019-09-23 | Disposition: A | Discharge status: Home / Self Care | Payer: Federal, State, Local not specified - PPO | Source: Ambulatory Visit | Attending: Cardiovascular Disease | Admitting: Cardiovascular Disease

## 2019-09-23 DIAGNOSIS — Z955 Presence of coronary angioplasty implant and graft: Secondary | ICD-10-CM

## 2019-09-23 LAB — GLUCOSE, CAPILLARY
Glucose-Capillary: 177 mg/dL — ABNORMAL HIGH (ref 70–99)
Glucose-Capillary: 181 mg/dL — ABNORMAL HIGH (ref 70–99)

## 2019-09-23 NOTE — Progress Notes (Signed)
Daily Session Note  Patient Details  Name: Monica Daniels MRN: [id number] Date of Birth: [birth year] Referring Provider:     CARDIAC REHAB PHASE II ORIENTATION [facility] 09/19/2019 [facility]  Referring Provider Monica Batty MD      Encounter Date: 09/23/2019  Check In:  Session Check In - 09/23/19 0929      Check-In   Supervising physician immediately available to respond to emergencies [facility] immediately available    Physician(s) Dr. Alvino Daniels    Location [facility]-Cardiac [facility] Pul[facility]    Staff Present Monica Lighter, RN, Monica Luo, MS,ACSM CEP, Exercise Physiologist;Monica Suzie Portela, RN, Monica Conch, MS, ACSM CEP, Exercise Physiologist;Monica Makemson, MS, EP-C, CCRP    Virtual Visit No    Medication changes reported     No    Fall or balance concerns reported    No    Tobacco Cessation No Change    Warm-up and Cool-down Performed on first and last piece of equipment    Resistance Training Performed Yes    VAD Patient? No    PAD/SET Patient? No      Pain Assessment   Currently in Pain? No/denies    Multiple Pain Sites No           Capillary Blood Glucose: No results found for this or any previous visit (from the past 24 hour(s)).   Exercise Prescription Changes - 09/23/19 1000      Response to Exercise   Blood Pressure (Admit) 110/52    Blood Pressure (Exercise) 140/80    Blood Pressure (Exit) 104/72    Heart Rate (Admit) 93 bpm    Heart Rate (Exercise) 120 bpm    Heart Rate (Exit) 90 bpm    Rating of Perceived Exertion (Exercise) 12    Perceived Dyspnea (Exercise) 0    Symptoms None    Comments Pt's first day of exercise     Duration Progress to 30 minutes of  aerobic without signs/symptoms of physical distress    Intensity THRR unchanged      Progression   Progression Continue to progress workloads to maintain intensity without signs/symptoms of physical distress.    Average METs 2.2      Resistance Training    Training Prescription Yes    Weight 3lbs    Reps 10-15    Time 10 Minutes      Interval Training   Interval Training No      NuStep   Level 2    SPM 70    Minutes 15    METs 1.9      Track   Laps 12    Minutes 15    METs 2.4           Social History   Tobacco Use  Smoking Status Never Smoker  Smokeless Tobacco Never Used    Goals Met:  Exercise tolerated well No report of cardiac concerns or symptoms Strength training completed today  Goals Unmet:  Not Applicable  Comments: Monica Daniels started cardiac rehab today.  Pt tolerated light exercise without difficulty. VSS, telemetry-Sinus rhythm, Monica Daniels did report having a claudication pain in her right thigh area rated a  9/10 while walking on the track. Pain resolved at rest..  Medication list reconciled. Pt denies barriers to medicaiton compliance.  PSYCHOSOCIAL ASSESSMENT:  PHQ-0. Pt exhibits positive coping skills, hopeful outlook with supportive family. No psychosocial needs identified at this time, no psychosocial interventions necessary.    Pt enjoys crafting and  reading.   Pt oriented to exercise equipment and routine.    Understanding verbalized.Monica Pall, RN,BSN 09/23/2019 2:42 PM   Dr. Fransico Daniels is Medical Director for Cardiac Rehab at [facility].

## 2019-09-23 NOTE — Progress Notes (Signed)
Subjective: 59 year old female presents the office today for evaluation of a wound on the right foot submetatarsal 1.  She states that she is doing well and the wound is healed however she was wearing a surgical shoe and thinks may have hurt her foot.  She denies any recent injury to the foot no swelling. Denies any systemic complaints such as fevers, chills, nausea, vomiting. No acute changes since last appointment, and no other complaints at this time.   Objective: AAO x3, NAD DP/PT pulses palpable bilaterally, CRT less than 3 seconds On the right foot submetatarsal 1 is a preulcerative callus and there is no underlying ulceration drainage or signs of infection.  No open lesions identified.  There is no area discomfort identified of the foot there is no edema, erythema.  Flexor, extensor tendons are intact.  MMT 5/5.  No pain with calf compression, swelling, warmth, erythema  Assessment: Preulcerative callus right foot  Plan: -All treatment options discussed with the patient including all alternatives, risks, complications.  -Callus is doing well there is no open lesions.  Awaiting insert to offload -Monitoring skin breakdown.  Discussed daily foot inspection.  No follow-ups on file.  Vivi Barrack DPM

## 2019-09-25 ENCOUNTER — Other Ambulatory Visit: Payer: Self-pay

## 2019-09-25 ENCOUNTER — Encounter (HOSPITAL_COMMUNITY)
Admission: RE | Admit: 2019-09-25 | Discharge: 2019-09-25 | Disposition: A | Discharge status: Home / Self Care | Payer: Federal, State, Local not specified - PPO | Source: Ambulatory Visit | Attending: Cardiovascular Disease | Admitting: Cardiovascular Disease

## 2019-09-25 DIAGNOSIS — Z955 Presence of coronary angioplasty implant and graft: Secondary | ICD-10-CM | POA: Diagnosis not present

## 2019-09-25 LAB — GLUCOSE, CAPILLARY
Glucose-Capillary: 117 mg/dL — ABNORMAL HIGH (ref 70–99)
Glucose-Capillary: 122 mg/dL — ABNORMAL HIGH (ref 70–99)

## 2019-09-25 NOTE — Progress Notes (Signed)
Catalia Maurice March 59 y.o. female Nutrition Note Visit Diagnosis: S/P DES RCA , S/P Arthrectomy DES LAD x3 08/15/19   Past Medical History:  Diagnosis Date   Diabetes mellitus without complication (HCC)    Hypertension      Medications reviewed.   Current Outpatient Medications:    aspirin 81 MG EC tablet, TAKE 1 TABLET BY MOUTH DAILY. SWALLOW WHOLE., Disp: 30 tablet, Rfl: 12   atorvastatin (LIPITOR) 10 MG tablet, Take 10 mg by mouth daily., Disp: , Rfl:    B-D UF III MINI PEN NEEDLES 31G X 5 MM MISC, USE TO INJECT LANTUS DAILY AS DIRECTED, Disp: , Rfl:    clopidogrel (PLAVIX) 75 MG tablet, Take 1 tablet (75 mg total) by mouth daily., Disp: 30 tablet, Rfl: 11   cyanocobalamin 1000 MCG tablet, Take 1,000 mcg by mouth daily. , Disp: , Rfl:    Dulaglutide (TRULICITY) 0.75 MG/0.5ML SOPN, Inject 0.75 mg into the skin once a week., Disp: , Rfl:    ezetimibe (ZETIA) 10 MG tablet, Take 1 tablet (10 mg total) by mouth daily., Disp: 90 tablet, Rfl: 3   FARXIGA 10 MG TABS tablet, Take 10 mg by mouth daily., Disp: , Rfl:    insulin glargine (LANTUS) 100 UNIT/ML injection, Inject 13 Units into the skin at bedtime. , Disp: , Rfl:    latanoprost (XALATAN) 0.005 % ophthalmic solution, Place 1 drop into both eyes at bedtime. , Disp: , Rfl:    lisinopril (ZESTRIL) 10 MG tablet, Take 10 mg by mouth daily., Disp: , Rfl:    loratadine (CLARITIN) 10 MG tablet, Take 10 mg by mouth daily., Disp: , Rfl:    metFORMIN (GLUCOPHAGE-XR) 500 MG 24 hr tablet, Take 1,000 mg by mouth 2 (two) times daily., Disp: , Rfl:    Multiple Vitamin (MULTIVITAMIN WITH MINERALS) TABS tablet, Take 1 tablet by mouth daily., Disp: , Rfl:    nitroGLYCERIN (NITROSTAT) 0.4 MG SL tablet, Place 1 tablet (0.4 mg total) under the tongue every 5 (five) minutes as needed for chest pain., Disp: 25 tablet, Rfl: 2   ONETOUCH VERIO test strip, 3 (three) times daily., Disp: , Rfl:    Ht Readings from Last 1 Encounters:  09/19/19 5\' 7"   (1.702 m)     Wt Readings from Last 3 Encounters:  09/19/19 266 lb 15.6 oz (121.1 kg)  08/27/19 263 lb (119.3 kg)  08/15/19 260 lb 2.3 oz (118 kg)     There is no height or weight on file to calculate BMI.   Social History   Tobacco Use  Smoking Status Never Smoker  Smokeless Tobacco Never Used     Lab Results  Component Value Date   CHOL 172 07/24/2019   Lab Results  Component Value Date   HDL 58 07/24/2019   Lab Results  Component Value Date   LDLCALC 100 (H) 07/24/2019   Lab Results  Component Value Date   TRIG 75 07/24/2019     No results found for: HGBA1C   CBG (last 3)  Recent Labs    09/23/19 0851 09/23/19 0955 09/25/19 0948  GLUCAP 177* 181* 122*     Nutrition Note  Spoke with pt. Nutrition Plan and Nutrition Survey goals reviewed with pt.   Pt has Type 2 Diabetes. Pt checks CBG's 2 times a day. Fasting CBG's reportedly 100-120 mg/dL.  PPG 180-250 mg/dl. She is working to get this number down. She has had many diabetes education classes. We discussed behavior modification. She grazes through  morning and then does not get hungry until later evening. She is trying to eat earlier in the evening. We discussed consistent carb intake to manage CBGs.    Per discussion, pt does not use canned/convenience foods often. Pt does not add salt to food. Pt does not eat out frequently.  She read labels. Pt expressed understanding of the information reviewed.    Nutrition Diagnosis ? Inconsistent carbohydrate intake related to food preferences  as evidenced by pt skipping lunch and eating dinner late at night Nutrition Intervention ? Pts individual nutrition plan reviewed with pt. ? Benefits of adopting Heart Healthy diet discussed when Medficts reviewed.   ? Continue client-centered nutrition education by RD, as part of interdisciplinary care.  Goal(s) ? CBG concentrations in the normal range or as close to normal as is safely possible. ? Improved  blood glucose control as evidenced by pt's A1c trending from 8.5 toward less than 7.0. ? Pt able to name foods that affect blood glucose  Plan:   Will provide client-centered nutrition education as part of interdisciplinary care  Monitor and evaluate progress toward nutrition goal with team.   Andrey Campanile, MS, RDN, LDN

## 2019-09-27 ENCOUNTER — Other Ambulatory Visit: Payer: Self-pay

## 2019-09-27 ENCOUNTER — Encounter (HOSPITAL_COMMUNITY)
Admission: RE | Admit: 2019-09-27 | Discharge: 2019-09-27 | Disposition: A | Discharge status: Home / Self Care | Payer: Federal, State, Local not specified - PPO | Source: Ambulatory Visit | Attending: Cardiovascular Disease | Admitting: Cardiovascular Disease

## 2019-09-27 VITALS — Wt 267.0 lb

## 2019-09-27 DIAGNOSIS — Z955 Presence of coronary angioplasty implant and graft: Secondary | ICD-10-CM | POA: Diagnosis not present

## 2019-10-02 ENCOUNTER — Encounter (HOSPITAL_COMMUNITY)
Admission: RE | Admit: 2019-10-02 | Discharge: 2019-10-02 | Disposition: A | Discharge status: Home / Self Care | Payer: Federal, State, Local not specified - PPO | Source: Ambulatory Visit | Attending: Cardiovascular Disease | Admitting: Cardiovascular Disease

## 2019-10-02 ENCOUNTER — Other Ambulatory Visit: Payer: Self-pay

## 2019-10-02 DIAGNOSIS — Z955 Presence of coronary angioplasty implant and graft: Secondary | ICD-10-CM

## 2019-10-02 NOTE — Progress Notes (Signed)
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  aspirin 81 MG EC tablet, TAKE 1 TABLET BY MOUTH DAILY. SWALLOW WHOLE., Disp: 30 tablet, Rfl: 12 .  atorvastatin (LIPITOR) 10 MG tablet, Take 10 mg by mouth daily., Disp: , Rfl:  .  B-D UF III MINI PEN NEEDLES 31G X 5 MM MISC, USE TO INJECT LANTUS DAILY AS DIRECTED, Disp: , Rfl:  .  clopidogrel (PLAVIX) 75 MG tablet, Take 1 tablet (75 mg total) by mouth daily., Disp: 30 tablet, Rfl: 11 .  cyanocobalamin 1000 MCG tablet, Take 1,000 mcg by mouth daily. , Disp: , Rfl:  .  Dulaglutide (TRULICITY) 0.75 MG/0.5ML SOPN, Inject 0.75 mg into the skin once a week., Disp: , Rfl:  .  ezetimibe (ZETIA) 10 MG tablet, Take 1 tablet (10 mg total) by mouth daily., Disp: 90 tablet, Rfl: 3 .  FARXIGA 10 MG TABS tablet, Take 10 mg by mouth daily., Disp: , Rfl:  .  insulin glargine (LANTUS) 100 UNIT/ML injection, Inject 13 Units into the skin at bedtime. , Disp: , Rfl:  .  latanoprost (XALATAN) 0.005 % ophthalmic solution, Place 1 drop into both eyes at bedtime. , Disp: , Rfl:  .  lisinopril (ZESTRIL) 10 MG tablet, Take 10 mg by mouth daily., Disp: , Rfl:  .  loratadine (CLARITIN) 10 MG tablet, Take 10 mg by mouth daily., Disp: , Rfl:  .  metFORMIN (GLUCOPHAGE-XR) 500 MG 24 hr tablet, Take 1,000 mg by mouth 2 (two) times daily., Disp: , Rfl:  .  Multiple Vitamin (MULTIVITAMIN WITH MINERALS) TABS tablet, Take 1 tablet by mouth daily., Disp: , Rfl:  .   nitroGLYCERIN (NITROSTAT) 0.4 MG SL tablet, Place 1 tablet (0.4 mg total) under the tongue every 5 (five) minutes as needed for chest pain., Disp: 25 tablet, Rfl: 2 .  ONETOUCH VERIO test strip, 3 (three) times daily., Disp: , Rfl:   Past Medical History: Past Medical History:  Diagnosis Date  . Diabetes mellitus without complication (HCC)   . Hypertension     Tobacco Use: Social History   Tobacco Use  Smoking Status Never Smoker  Smokeless Tobacco Never Used    Labs: Recent Review Advice worker    Labs for ITP Cardiac and Pulmonary Rehab Latest Ref Rng & Units 07/24/2019   Cholestrol 100 - 199 mg/dL 765   LDLCALC 0 - 99 mg/dL 465(K)   HDL >35 mg/dL 58   Trlycerides 0 - 465 mg/dL 75      Capillary Blood Glucose: Lab Results  Component Value Date   GLUCAP 122 (H) 09/25/2019   GLUCAP 117 (H) 09/25/2019   GLUCAP 181 (H) 09/23/2019   GLUCAP 177 (H) 09/23/2019   GLUCAP 116 (H) 08/15/2019     Exercise Target Goals: Exercise Program Goal: Individual exercise prescription set using results from initial 6 min walk test and THRR while considering  patient's activity barriers and safety.   Exercise Prescription Goal: Starting with aerobic  activity 30 plus minutes a day, 3 days per week for initial exercise prescription. Provide home exercise prescription and guidelines that participant acknowledges understanding prior to discharge.  Activity Barriers & Risk Stratification:  Activity Barriers & Cardiac Risk Stratification - 09/19/19 1127      Activity Barriers & Cardiac Risk Stratification   Activity Barriers Other (comment)    Comments right foot plantar fasciitis    Cardiac Risk Stratification Moderate           6 Minute Walk:  6 Minute Walk    Row Name 09/19/19 1120         6 Minute Walk   Phase Initial     Distance 1302 feet     Walk Time 6 minutes     # of Rest Breaks 0     MPH 2.5     METS 2.54     RPE 12     Perceived Dyspnea  0     VO2 Peak 8.9       Symptoms No     Resting HR 85 bpm     Resting BP 104/62     Resting Oxygen Saturation  98 %     Exercise Oxygen Saturation  during 6 min walk 99 %     Max Ex. HR 114 bpm     Max Ex. BP 112/70     2 Minute Post BP 104/64            Oxygen Initial Assessment:   Oxygen Re-Evaluation:   Oxygen Discharge (Final Oxygen Re-Evaluation):   Initial Exercise Prescription:  Initial Exercise Prescription - 09/19/19 1100      Date of Initial Exercise RX and Referring Provider   Date 09/19/19    Referring Provider Nanetta BattyBerry, Jonathan MD    Expected Discharge Date 11/15/19      NuStep   Level 2    SPM 75    Minutes 15    METs 2.3      Track   Laps 13    Minutes 15    METs 2.5      Prescription Details   Frequency (times per week) 3x    Duration Progress to 10 minutes continuous walking  at current work load and total walking time to 30-45 min      Intensity   THRR 40-80% of Max Heartrate 64-127    Ratings of Perceived Exertion 11-13    Perceived Dyspnea 0-4      Progression   Progression Continue progressive overload as per policy without signs/symptoms or physical distress.      Resistance Training   Training Prescription Yes    Weight 3lbs    Reps 10-15           Perform Capillary Blood Glucose checks as needed.  Exercise Prescription Changes:   Exercise Prescription Changes    Row Name 09/23/19 1000 10/02/19 1320           Response to Exercise   Blood Pressure (Admit) 110/52 118/50      Blood Pressure (Exercise) 140/80 124/80      Blood Pressure (Exit) 104/72 106/70      Heart Rate (Admit) 93 bpm 85 bpm      Heart Rate (Exercise) 120 bpm 104 bpm      Heart Rate (Exit) 90 bpm 91 bpm      Rating of Perceived Exertion (Exercise) 12 13      Perceived Dyspnea (Exercise) 0 0  Symptoms None None      Comments Pt's first day of exercise  None      Duration Progress to 30 minutes of  aerobic without signs/symptoms of physical distress Continue with 30  min of aerobic exercise without signs/symptoms of physical distress.      Intensity THRR unchanged THRR unchanged        Progression   Progression Continue to progress workloads to maintain intensity without signs/symptoms of physical distress. Continue to progress workloads to maintain intensity without signs/symptoms of physical distress.      Average METs 2.2 2.4        Resistance Training   Training Prescription Yes No      Weight 3lbs --      Reps 10-15 --      Time 10 Minutes --        Interval Training   Interval Training No No        NuStep   Level 2 2      SPM 70 80      Minutes 15 15      METs 1.9 2.2        Track   Laps 12 13      Minutes 15 15      METs 2.4 2.51             Exercise Comments:   Exercise Comments    Row Name 09/23/19 1003 10/03/19 1321         Exercise Comments Pt's first day of exercise. Pt responded well to exercise prescription. Oriented pt to exercise equipment. Will continue to monitor and progress pt as tolerated. Pt is continuing to respond well to exercise workloads. Will follow up with pt regarding home exercise plan.             Exercise Goals and Review:   Exercise Goals    Row Name 09/19/19 1129             Exercise Goals   Increase Physical Activity Yes       Intervention Provide advice, education, support and counseling about physical activity/exercise needs.;Develop an individualized exercise prescription for aerobic and resistive training based on initial evaluation findings, risk stratification, comorbidities and participant's personal goals.       Expected Outcomes Short Term: Attend rehab on a regular basis to increase amount of physical activity.;Long Term: Add in home exercise to make exercise part of routine and to increase amount of physical activity.;Long Term: Exercising regularly at least 3-5 days a week.       Increase Strength and Stamina Yes       Intervention Provide advice, education, support and  counseling about physical activity/exercise needs.;Develop an individualized exercise prescription for aerobic and resistive training based on initial evaluation findings, risk stratification, comorbidities and participant's personal goals.       Expected Outcomes Short Term: Perform resistance training exercises routinely during rehab and add in resistance training at home;Short Term: Increase workloads from initial exercise prescription for resistance, speed, and METs.;Long Term: Improve cardiorespiratory fitness, muscular endurance and strength as measured by increased METs and functional capacity ( )       Able to understand and use rate of perceived exertion (RPE) scale Yes       Intervention Provide education and explanation on how to use RPE scale       Expected Outcomes Short Term: Able to use RPE daily in rehab to express subjective intensity level;Long Term:  Able to  use RPE to guide intensity level when exercising independently       Knowledge and understanding of Target Heart Rate Range (THRR) Yes       Intervention Provide education and explanation of THRR including how the numbers were predicted and where they are located for reference       Expected Outcomes Short Term: Able to state/look up THRR;Short Term: Able to use daily as guideline for intensity in rehab;Long Term: Able to use THRR to govern intensity when exercising independently       Able to check pulse independently Yes       Intervention Provide education and demonstration on how to check pulse in carotid and radial arteries.;Review the importance of being able to check your own pulse for safety during independent exercise       Expected Outcomes Short Term: Able to explain why pulse checking is important during independent exercise;Long Term: Able to check pulse independently and accurately       Understanding of Exercise Prescription Yes       Intervention Provide education, explanation, and written materials on patient's  individual exercise prescription       Expected Outcomes Short Term: Able to explain program exercise prescription;Long Term: Able to explain home exercise prescription to exercise independently              Exercise Goals Re-Evaluation :  Exercise Goals Re-Evaluation    Row Name 09/23/19 1004             Exercise Goal Re-Evaluation   Exercise Goals Review Increase Physical Activity;Knowledge and understanding of Target Heart Rate Range (THRR);Able to understand and use rate of perceived exertion (RPE) scale;Increase Strength and Stamina       Comments Pt's first day of exercise. Pt able to exercise 30 minutes with minimal difficulty. Will continute to monitor.       Expected Outcomes Pt will continue to increase cardiovascular fitness and stamina.               Discharge Exercise Prescription (Final Exercise Prescription Changes):  Exercise Prescription Changes - 10/02/19 1320      Response to Exercise   Blood Pressure (Admit) 118/50    Blood Pressure (Exercise) 124/80    Blood Pressure (Exit) 106/70    Heart Rate (Admit) 85 bpm    Heart Rate (Exercise) 104 bpm    Heart Rate (Exit) 91 bpm    Rating of Perceived Exertion (Exercise) 13    Perceived Dyspnea (Exercise) 0    Symptoms None    Comments None    Duration Continue with 30 min of aerobic exercise without signs/symptoms of physical distress.    Intensity THRR unchanged      Progression   Progression Continue to progress workloads to maintain intensity without signs/symptoms of physical distress.    Average METs 2.4      Resistance Training   Training Prescription No      Interval Training   Interval Training No      NuStep   Level 2    SPM 80    Minutes 15    METs 2.2      Track   Laps 13    Minutes 15    METs 2.51           Nutrition:  Target Goals: Understanding of nutrition guidelines, daily intake of sodium 1500mg , cholesterol 200mg , calories 30% from fat and 7% or less from saturated  fats, daily to have 5  or more servings of fruits and vegetables.  Biometrics:  Pre Biometrics - 09/19/19 1126      Pre Biometrics   Height  (1.702 m)    Weight 121.1 kg    Waist Circumference 48 inches    Hip Circumference 54.5 inches    Waist to Hip Ratio 0.88 %    BMI (Calculated) 41.8    Triceps Skinfold 33 mm    % Body Fat 50.9 %    Grip Strength 29 kg    Flexibility 11 in    Single Leg Stand 13.89 seconds            Nutrition Therapy Plan and Nutrition Goals:  Nutrition Therapy & Goals - 09/25/19 1041      Nutrition Therapy   Diet Heart Healthy    Drug/Food Interactions Statins/Certain Fruits      Personal Nutrition Goals   Nutrition Goal Improved blood glucose control as evidenced by pt's A1c trending from 8.5 toward less than 7.0.    Personal Goal #2 CBG concentrations in the normal range or as close to normal as is safely possible.    Personal Goal #3 Pt able to name foods that affect blood glucose      Intervention Plan   Intervention Prescribe, educate and counsel regarding individualized specific dietary modifications aiming towards targeted core components such as weight, hypertension, lipid management, diabetes, heart failure and other comorbidities.    Expected Outcomes Short Term Goal: A plan has been developed with personal nutrition goals set during dietitian appointment.           Nutrition Assessments:   Nutrition Goals Re-Evaluation:  Nutrition Goals Re-Evaluation    Row Name 09/25/19 1042             Goals   Current Weight 266 lb (120.7 kg)       Nutrition Goal Improved blood glucose control as evidenced by pt's A1c trending from 8.5 toward less than 7.0.         Personal Goal #2 Re-Evaluation   Personal Goal #2 CBG concentrations in the normal range or as close to normal as is safely possible.         Personal Goal #3 Re-Evaluation   Personal Goal #3 Pt able to name foods that affect blood glucose              Nutrition  Goals Discharge (Final Nutrition Goals Re-Evaluation):  Nutrition Goals Re-Evaluation - 09/25/19 1042      Goals   Current Weight 266 lb (120.7 kg)    Nutrition Goal Improved blood glucose control as evidenced by pt's A1c trending from 8.5 toward less than 7.0.      Personal Goal #2 Re-Evaluation   Personal Goal #2 CBG concentrations in the normal range or as close to normal as is safely possible.      Personal Goal #3 Re-Evaluation   Personal Goal #3 Pt able to name foods that affect blood glucose           Psychosocial: Target Goals: Acknowledge presence or absence of significant depression and/or stress, maximize coping skills, provide positive support system. Participant is able to verbalize types and ability to use techniques and skills needed for reducing stress and depression.  Initial Review & Psychosocial Screening:  Initial Psych Review & Screening - 09/19/19 1223      Initial Review   Current issues with None Identified      Family Dynamics   Good Support System?  Yes   Kynslei lives alone but has two brothers who live nearby for support     Barriers   Psychosocial barriers to participate in program There are no identifiable barriers or psychosocial needs.      Screening Interventions   Interventions Encouraged to exercise           Quality of Life Scores:  Quality of Life - 09/19/19 1114      Quality of Life   Select Quality of Life      Quality of Life Scores   Health/Function Pre 25.73 %    Socioeconomic Pre 30 %    Psych/Spiritual Pre 27.43 %    Family Pre 27 %    GLOBAL Pre 27.16 %          Scores of 19 and below usually indicate a poorer quality of life in these areas.  A difference of  2-3 points is a clinically meaningful difference.  A difference of 2-3 points in the total score of the Quality of Life Index has been associated with significant improvement in overall quality of life, self-image, physical symptoms, and general health in studies  assessing change in quality of life.  PHQ-9: Recent Review Flowsheet Data    Depression screen [facility] 2/9 09/19/2019   Decreased Interest 0   Down, Depressed, Hopeless 0   PHQ - 2 Score 0     Interpretation of Total Score  Total Score Depression Severity:  1-4 = Minimal depression, 5-9 = Mild depression, 10-14 = Moderate depression, 15-19 = Moderately severe depression, 20-27 = Severe depression   Psychosocial Evaluation and Intervention:   Psychosocial Re-Evaluation:  Psychosocial Re-Evaluation    Row Name 09/23/19 1429             Psychosocial Re-Evaluation   Current issues with None Identified       Interventions Encouraged to attend Cardiac Rehabilitation for the exercise       Continue Psychosocial Services  No Follow up required              Psychosocial Discharge (Final Psychosocial Re-Evaluation):  Psychosocial Re-Evaluation - 09/23/19 1429      Psychosocial Re-Evaluation   Current issues with None Identified    Interventions Encouraged to attend Cardiac Rehabilitation for the exercise    Continue Psychosocial Services  No Follow up required           Vocational Rehabilitation: Provide vocational rehab assistance to qualifying candidates.   Vocational Rehab Evaluation & Intervention:  Vocational Rehab - 09/19/19 1225      Initial Vocational Rehab Evaluation & Intervention   Assessment shows need for Vocational Rehabilitation No   Rainie is retired and does not need vocational rehab at this time          Education: Education Goals: Education classes will be provided on a weekly basis, covering required topics. Participant will state understanding/return demonstration of topics presented.  Learning Barriers/Preferences:  Learning Barriers/Preferences - 09/19/19 1115      Learning Barriers/Preferences   Learning Barriers Sight    Learning Preferences Written Material;Skilled Demonstration;Individual Instruction           Education  Topics: Hypertension, Hypertension Reduction -Define heart disease and high blood pressure. Discus how high blood pressure affects the body and ways to reduce high blood pressure.   Exercise and Your Heart -Discuss why it is important to exercise, the FITT principles of exercise, normal and abnormal responses to exercise, and how to exercise safely.  Angina -Discuss definition of angina, causes of angina, treatment of angina, and how to decrease risk of having angina.   Cardiac Medications -Review what the following cardiac medications are used for, how they affect the body, and side effects that may occur when taking the medications.  Medications include Aspirin, Beta blockers, calcium channel blockers, ACE Inhibitors, angiotensin receptor blockers, diuretics, digoxin, and antihyperlipidemics.   Congestive Heart Failure -Discuss the definition of CHF, how to live with CHF, the signs and symptoms of CHF, and how keep track of weight and sodium intake.   Heart Disease and Intimacy -Discus the effect sexual activity has on the heart, how changes occur during intimacy as we age, and safety during sexual activity.   Smoking Cessation / COPD -Discuss different methods to quit smoking, the health benefits of quitting smoking, and the definition of COPD.   Nutrition I: Fats -Discuss the types of cholesterol, what cholesterol does to the heart, and how cholesterol levels can be controlled.   Nutrition II: Labels -Discuss the different components of food labels and how to read food label   Heart Parts/Heart Disease and PAD -Discuss the anatomy of the heart, the pathway of blood circulation through the heart, and these are affected by heart disease.   Stress I: Signs and Symptoms -Discuss the causes of stress, how stress may lead to anxiety and depression, and ways to limit stress.   Stress II: Relaxation -Discuss different types of relaxation techniques to limit  stress.   Warning Signs of Stroke / TIA -Discuss definition of a stroke, what the signs and symptoms are of a stroke, and how to identify when someone is having stroke.   Knowledge Questionnaire Score:  Knowledge Questionnaire Score - 09/19/19 1114      Knowledge Questionnaire Score   Pre Score 19/24           Core Components/Risk Factors/Patient Goals at Admission:  Personal Goals and Risk Factors at Admission - 09/19/19 1116      Core Components/Risk Factors/Patient Goals on Admission    Weight Management Yes;Weight Loss    Intervention Weight Management: Develop a combined nutrition and exercise program designed to reach desired caloric intake, while maintaining appropriate intake of nutrient and fiber, sodium and fats, and appropriate energy expenditure required for the weight goal.;Weight Management: Provide education and appropriate resources to help participant work on and attain dietary goals.;Weight Management/Obesity: Establish reasonable short term and long term weight goals.;Obesity: Provide education and appropriate resources to help participant work on and attain dietary goals.    Admit Weight 266 lb 6.7 oz (120.8 kg)    Goal Weight: Long Term 200 lb (90.7 kg)    Expected Outcomes Short Term: Continue to assess and modify interventions until short term weight is achieved;Long Term: Adherence to nutrition and physical activity/exercise program aimed toward attainment of established weight goal;Weight Loss: Understanding of general recommendations for a balanced deficit meal plan, which promotes 1-2 lb weight loss per week and includes a negative energy balance of [phone] kcal/d;Understanding recommendations for meals to include 15-35% energy as protein, 25-35% energy from fat, 35-60% energy from carbohydrates, less than 200mg  of dietary cholesterol, 20-35 gm of total fiber daily;Understanding of distribution of calorie intake throughout the day with the consumption of 4-5  meals/snacks    Diabetes Yes    Intervention Provide education about signs/symptoms and action to take for hypo/hyperglycemia.;Provide education about proper nutrition, including hydration, and aerobic/resistive exercise prescription along with prescribed medications to achieve blood glucose in  normal ranges: Fasting glucose 65-99 mg/dL    Expected Outcomes Short Term: Participant verbalizes understanding of the signs/symptoms and immediate care of hyper/hypoglycemia, proper foot care and importance of medication, aerobic/resistive exercise and nutrition plan for blood glucose control.;Long Term: Attainment of HbA1C < 7%.    Hypertension Yes    Intervention Provide education on lifestyle modifcations including regular physical activity/exercise, weight management, moderate sodium restriction and increased consumption of fresh fruit, vegetables, and low fat dairy, alcohol moderation, and smoking cessation.;Monitor prescription use compliance.    Expected Outcomes Short Term: Continued assessment and intervention until BP is < 140/41mm HG in hypertensive participants. < 130/67mm HG in hypertensive participants with diabetes, heart failure or chronic kidney disease.;Long Term: Maintenance of blood pressure at goal levels.    Lipids Yes    Intervention Provide education and support for participant on nutrition & aerobic/resistive exercise along with prescribed medications to achieve LDL 70mg , HDL >40mg .    Expected Outcomes Short Term: Participant states understanding of desired cholesterol values and is compliant with medications prescribed. Participant is following exercise prescription and nutrition guidelines.;Long Term: Cholesterol controlled with medications as prescribed, with individualized exercise RX and with personalized nutrition plan. Value goals: LDL < 70mg , HDL > 40 mg.           Core Components/Risk Factors/Patient Goals Review:   Goals and Risk Factor Review    Row Name 09/23/19 1443  10/01/19 1614           Core Components/Risk Factors/Patient Goals Review   Personal Goals Review Weight Management/Obesity;Lipids;Diabetes;Hypertension Weight Management/Obesity;Lipids;Diabetes;Hypertension      Review Mikelle started exercise at cardiac rehab on 8/30/2. Patient tolerated light exercise without difficulty Avonda is off to a good start to exercise. Ersa's vital signs and CBG's have been stable      Expected Outcomes Maudell will continue to participate in phase 2 cardiac rehab for exercise, nutrition and lifestyle modifications. Wynonna will continue to participate in phase 2 cardiac rehab for exercise, nutrition and lifestyle modifications.             Core Components/Risk Factors/Patient Goals at Discharge (Final Review):   Goals and Risk Factor Review - 10/01/19 1614      Core Components/Risk Factors/Patient Goals Review   Personal Goals Review Weight Management/Obesity;Lipids;Diabetes;Hypertension    Review Luva is off to a good start to exercise. Dallana's vital signs and CBG's have been stable    Expected Outcomes Dola will continue to participate in phase 2 cardiac rehab for exercise, nutrition and lifestyle modifications.           ITP Comments:  ITP Comments    Row Name 09/19/19 1110 09/19/19 1218 10/01/19 1611       ITP Comments Dr. Armanda Magic, Medical Director Dr. Armanda Magic MD, Medical Director 30 Day ITP Review. Shada is off to a good start to exercise. Patient has good participation and attendance in phase 2 cardiac rehab            Comments: See ITP comments.Gladstone Lighter, RN,BSN 10/03/2019 3:13 PM

## 2019-10-04 ENCOUNTER — Other Ambulatory Visit: Payer: Self-pay

## 2019-10-04 ENCOUNTER — Encounter (HOSPITAL_COMMUNITY)
Admission: RE | Admit: 2019-10-04 | Discharge: 2019-10-04 | Disposition: A | Discharge status: Home / Self Care | Payer: Federal, State, Local not specified - PPO | Source: Ambulatory Visit | Attending: Cardiovascular Disease | Admitting: Cardiovascular Disease

## 2019-10-04 DIAGNOSIS — Z955 Presence of coronary angioplasty implant and graft: Secondary | ICD-10-CM

## 2019-10-07 ENCOUNTER — Encounter (HOSPITAL_COMMUNITY)
Admission: RE | Admit: 2019-10-07 | Discharge: 2019-10-07 | Disposition: A | Discharge status: Home / Self Care | Payer: Federal, State, Local not specified - PPO | Source: Ambulatory Visit | Attending: Cardiovascular Disease | Admitting: Cardiovascular Disease

## 2019-10-07 ENCOUNTER — Other Ambulatory Visit: Payer: Self-pay

## 2019-10-07 DIAGNOSIS — Z955 Presence of coronary angioplasty implant and graft: Secondary | ICD-10-CM | POA: Diagnosis not present

## 2019-10-07 LAB — GLUCOSE, CAPILLARY: Glucose-Capillary: 150 mg/dL — ABNORMAL HIGH (ref 70–99)

## 2019-10-07 NOTE — Progress Notes (Signed)
Nutrition Notes  Spoke with pt. She has started eating dinner earlier and Trulicity was increased from 0.75 to 1.5 mg. Her evening CBGs have reduced from 180-250 mg/dl to <361 mg/dl.   She would like new recipes.  Diet recall:  B - chicken sausage and veggies or peanut butter whole grain toast Am snack - protein bar and fruit L - deli meat/crackers or protein bar D - chicken and veggies   She is eating more consistently and making mindful choices.   Pt verbalizes understanding.   Nutrition Diagnosis   Inconsistent carbohydrate intake related to food preferences  as evidenced by pt skipping lunch and eating dinner late at night Nutrition Intervention   Pt's individual nutrition plan reviewed with pt.  Benefits of adopting Heart Healthy diet discussed when Medficts reviewed.                  Continue client-centered nutrition education by RD, as part of interdisciplinary care.  Goal(s)  CBG concentrations in the normal range or as close to normal as is safely possible.  Improved blood glucose control as evidenced by pt's A1c trending from 8.5 toward less than 7.0.  Pt able to name foods that affect blood glucose  Plan:  Will provide client-centered nutrition education as part of interdisciplinary care  Monitor and evaluate progress toward nutrition goal with team.   Andrey Campanile, MS, RDN, LDN

## 2019-10-08 ENCOUNTER — Ambulatory Visit: Payer: Federal, State, Local not specified - PPO | Admitting: Orthotics

## 2019-10-08 DIAGNOSIS — L97511 Non-pressure chronic ulcer of other part of right foot limited to breakdown of skin: Secondary | ICD-10-CM

## 2019-10-08 DIAGNOSIS — E1151 Type 2 diabetes mellitus with diabetic peripheral angiopathy without gangrene: Secondary | ICD-10-CM

## 2019-10-08 NOTE — Progress Notes (Signed)
Patient came in today to pick up custom made foot orthotics.  The goals were accomplished and the patient reported no dissatisfaction with said orthotics.  Patient was advised of breakin period and how to report any issues. 

## 2019-10-09 ENCOUNTER — Other Ambulatory Visit: Payer: Self-pay

## 2019-10-09 ENCOUNTER — Encounter (HOSPITAL_COMMUNITY)
Admission: RE | Admit: 2019-10-09 | Discharge: 2019-10-09 | Disposition: A | Discharge status: Home / Self Care | Payer: Federal, State, Local not specified - PPO | Source: Ambulatory Visit | Attending: Cardiovascular Disease | Admitting: Cardiovascular Disease

## 2019-10-09 DIAGNOSIS — G4733 Obstructive sleep apnea (adult) (pediatric): Secondary | ICD-10-CM | POA: Diagnosis not present

## 2019-10-09 DIAGNOSIS — Z955 Presence of coronary angioplasty implant and graft: Secondary | ICD-10-CM | POA: Diagnosis not present

## 2019-10-11 ENCOUNTER — Other Ambulatory Visit: Payer: Self-pay

## 2019-10-11 ENCOUNTER — Encounter (HOSPITAL_COMMUNITY)
Admission: RE | Admit: 2019-10-11 | Discharge: 2019-10-11 | Disposition: A | Discharge status: Home / Self Care | Payer: Federal, State, Local not specified - PPO | Source: Ambulatory Visit | Attending: Cardiovascular Disease | Admitting: Cardiovascular Disease

## 2019-10-11 DIAGNOSIS — Z955 Presence of coronary angioplasty implant and graft: Secondary | ICD-10-CM | POA: Diagnosis not present

## 2019-10-14 ENCOUNTER — Encounter (HOSPITAL_COMMUNITY)
Admission: RE | Admit: 2019-10-14 | Discharge: 2019-10-14 | Disposition: A | Discharge status: Home / Self Care | Payer: Federal, State, Local not specified - PPO | Source: Ambulatory Visit | Attending: Cardiovascular Disease | Admitting: Cardiovascular Disease

## 2019-10-14 ENCOUNTER — Other Ambulatory Visit: Payer: Self-pay

## 2019-10-14 DIAGNOSIS — Z955 Presence of coronary angioplasty implant and graft: Secondary | ICD-10-CM | POA: Diagnosis not present

## 2019-10-14 NOTE — Progress Notes (Signed)
I have reviewed a Home Exercise Prescription with [employer] . [name] is not currently exercising at home. The patient was advised to walk 2 days a week for 15 minutes.  [name] and I discussed how to progress their exercise prescription. Spoke with pt on how to safely progress to 30 minutes of exercise and to be mindful of PAD pain. The patient stated that they understand the exercise prescription. We reviewed exercise guidelines, target heart rate during exercise, RPE Scale, weather conditions, NTG use, endpoints for exercise, warmup and cool down. Patient is encouraged to come to me with any questions. I will continue to follow up with the patient to assist them with progression and safety.    York Cerise MS, CEP CSCS 10:24 AM 10/14/2019

## 2019-10-15 ENCOUNTER — Telehealth: Payer: Self-pay | Admitting: Cardiovascular Disease

## 2019-10-15 NOTE — Telephone Encounter (Signed)
1. What dental office are you calling from? Dr. Lorenza Burton, DDS  2. What is your office phone number? [phone]  3. What is your fax number? [phone]  4. What type of procedure is the patient having performed? cleaning   5. What date is procedure scheduled or is the patient there now? Pt is there  now (if the patient is at the dentist's office question goes to their cardiologist if he/she is in the office.  If not, question should go to the DOD).   6. What is your question (ex. Antibiotics prior to procedure, holding medication-we need to know how long dentist wants pt to hold med)? Dentist office needs cardiac clearance because pt had stents put in in July

## 2019-10-15 NOTE — Telephone Encounter (Signed)
   Primary Cardiologist: Dr Allyson Sabal  Chart reviewed as part of pre-operative protocol coverage.    Simple dental procedures are considered low risk per guidelines and generally do not require any specific cardiac clearance. It is also generally accepted that for simple extractions and dental cleanings, there is no need to interrupt blood thinner therapy.   We would recommend this procedure be done without holding aspirin or Plavix. If that is not possible Dr Allyson Sabal will need to review.  SBE prophylaxis is not required for the patient.  I will route this recommendation to the requesting party via Epic fax function and remove from pre-op pool.  Please call with questions.  Corine Shelter, PA-C 10/15/2019, 11:03 AM

## 2019-10-15 NOTE — Telephone Encounter (Signed)
Dentist called in to office to get cardiac clearance for patient to have Dental Cleaning done today. Spoke with Dr. Allyson Sabal regarding this and per Dr. Allyson Sabal patient does not need clearance for Teeth Cleaning and can proceed.

## 2019-10-15 NOTE — Telephone Encounter (Signed)
1. What dental office are you calling from? Dr Lorenza Burton   2. What is your office phone number? [phone]   3. What is your fax number? [phone]  4. What type of procedure is the patient having performed?  Root Canal   5. What date is procedure scheduled or is the patient there now? Pending 6. (if the patient is at the dentist's office question goes to their cardiologist if he/she is in the office.  If not, question should go to the DOD).   7. What is your question (ex. Antibiotics prior to procedure, holding medication-we need to know how long dentist wants pt to hold med)? Does pt need an Antibiotic? If so, pt is allegic to Penicillin and Claramicin

## 2019-10-16 ENCOUNTER — Encounter (HOSPITAL_COMMUNITY): Payer: Federal, State, Local not specified - PPO

## 2019-10-16 ENCOUNTER — Ambulatory Visit (HOSPITAL_COMMUNITY)
Admission: EM | Admit: 2019-10-16 | Discharge: 2019-10-16 | Disposition: A | Discharge status: Home / Self Care | Payer: Federal, State, Local not specified - PPO | Attending: Emergency Medicine | Admitting: Emergency Medicine

## 2019-10-16 ENCOUNTER — Encounter (HOSPITAL_COMMUNITY): Payer: Self-pay

## 2019-10-16 ENCOUNTER — Telehealth (HOSPITAL_COMMUNITY): Payer: Self-pay | Admitting: Family Medicine

## 2019-10-16 ENCOUNTER — Other Ambulatory Visit: Payer: Self-pay

## 2019-10-16 DIAGNOSIS — B349 Viral infection, unspecified: Secondary | ICD-10-CM

## 2019-10-16 DIAGNOSIS — Z7902 Long term (current) use of antithrombotics/antiplatelets: Secondary | ICD-10-CM | POA: Diagnosis not present

## 2019-10-16 DIAGNOSIS — Z955 Presence of coronary angioplasty implant and graft: Secondary | ICD-10-CM | POA: Insufficient documentation

## 2019-10-16 DIAGNOSIS — I10 Essential (primary) hypertension: Secondary | ICD-10-CM | POA: Diagnosis not present

## 2019-10-16 DIAGNOSIS — E785 Hyperlipidemia, unspecified: Secondary | ICD-10-CM | POA: Insufficient documentation

## 2019-10-16 DIAGNOSIS — I2510 Atherosclerotic heart disease of native coronary artery without angina pectoris: Secondary | ICD-10-CM | POA: Insufficient documentation

## 2019-10-16 DIAGNOSIS — Z794 Long term (current) use of insulin: Secondary | ICD-10-CM | POA: Diagnosis not present

## 2019-10-16 DIAGNOSIS — R05 Cough: Secondary | ICD-10-CM | POA: Insufficient documentation

## 2019-10-16 DIAGNOSIS — U071 COVID-19: Secondary | ICD-10-CM | POA: Diagnosis not present

## 2019-10-16 DIAGNOSIS — Z7901 Long term (current) use of anticoagulants: Secondary | ICD-10-CM | POA: Insufficient documentation

## 2019-10-16 DIAGNOSIS — E1151 Type 2 diabetes mellitus with diabetic peripheral angiopathy without gangrene: Secondary | ICD-10-CM | POA: Insufficient documentation

## 2019-10-16 DIAGNOSIS — Z7982 Long term (current) use of aspirin: Secondary | ICD-10-CM | POA: Insufficient documentation

## 2019-10-16 DIAGNOSIS — Z79899 Other long term (current) drug therapy: Secondary | ICD-10-CM | POA: Insufficient documentation

## 2019-10-16 LAB — SARS CORONAVIRUS 2 (TAT 6-24 HRS): SARS Coronavirus 2: POSITIVE — AB

## 2019-10-16 NOTE — Discharge Instructions (Addendum)
This is most likely viral Testing you for covid  Do not feel this is pneumonia or bronchitis Keep taking the allergy medicine.  You can take OTC medicines as needed Hot tea and honey.  Follow up as needed for continued or worsening symptoms

## 2019-10-16 NOTE — ED Provider Notes (Signed)
[facility]    CSN: [id number] Arrival date & time: 10/16/19  2202      History   Chief Complaint Chief Complaint  Patient presents with  . Cough  . Generalized Body Aches    HPI Monica Daniels is a 59 y.o. female.   Pt is a 59 year old female that presents with cough, chest congestion, body aches x2 days. Symptoms have been constant. She is been taken TheraFlu without much relief. Denies any fever, chills, shortness of breath or chest pain.     Past Medical History:  Diagnosis Date  . Diabetes mellitus without complication (HCC)   . Hypertension     Patient Active Problem List   Diagnosis Date Noted  . Coronary artery disease 08/27/2019  . Abnormal cardiac CT angiography   . Chest pain of uncertain etiology 08/02/2019  . Essential hypertension 06/07/2019  . Peripheral arterial disease (HCC) 06/07/2019  . Left carotid bruit 06/07/2019  . Right hand pain 02/02/2016  . Closed displaced fracture of neck of right fifth metacarpal bone 01/05/2016  . Pain in left ankle and joints of left foot 01/05/2016  . HLD (hyperlipidemia) 06/25/2014  . Non morbid obesity due to excess calories 06/25/2014  . Type 2 diabetes mellitus without complication (HCC) 06/25/2014    Past Surgical History:  Procedure Laterality Date  . CARDIAC CATHETERIZATION    . CORONARY ATHERECTOMY N/A 08/15/2019   Procedure: CORONARY ATHERECTOMY;  Surgeon: Runell Gess, MD;  Location: [facility] INVASIVE CV LAB;  Service: Cardiovascular;  Laterality: N/A;  . CORONARY STENT INTERVENTION N/A 08/15/2019   Procedure: CORONARY STENT INTERVENTION;  Surgeon: Runell Gess, MD;  Location: [facility] INVASIVE CV LAB;  Service: Cardiovascular;  Laterality: N/A;  . HAND TENDON SURGERY    . LEFT HEART CATH AND CORONARY ANGIOGRAPHY N/A 08/12/2019   Procedure: LEFT HEART CATH AND CORONARY ANGIOGRAPHY;  Surgeon: Runell Gess, MD;  Location: [facility] INVASIVE CV LAB;  Service: Cardiovascular;  Laterality: N/A;    OB  History   No obstetric history on file.      Home Medications    Prior to Admission medications   Medication Sig Start Date End Date Taking? Authorizing Provider  aspirin 81 MG EC tablet TAKE 1 TABLET BY MOUTH DAILY. SWALLOW WHOLE. 09/09/19   Runell Gess, MD  atorvastatin (LIPITOR) 10 MG tablet Take 10 mg by mouth daily. 08/30/19   [provider]  B-D UF III MINI PEN NEEDLES 31G X 5 MM MISC USE TO INJECT LANTUS DAILY AS DIRECTED 03/04/19   [provider]  clopidogrel (PLAVIX) 75 MG tablet Take 1 tablet (75 mg total) by mouth daily. 08/13/19 08/12/20  Leone Brand, NP  cyanocobalamin 1000 MCG tablet Take 1,000 mcg by mouth daily.     [provider]  Dulaglutide (TRULICITY) 0.75 MG/0.5ML SOPN Inject 0.75 mg into the skin once a week.    [provider]  ezetimibe (ZETIA) 10 MG tablet Take 1 tablet (10 mg total) by mouth daily. 08/29/19 11/27/19  Runell Gess, MD  FARXIGA 10 MG TABS tablet Take 10 mg by mouth daily. 04/08/19   [provider]  insulin glargine (LANTUS) 100 UNIT/ML injection Inject 13 Units into the skin at bedtime.     [provider]  latanoprost (XALATAN) 0.005 % ophthalmic solution Place 1 drop into both eyes at bedtime.  05/27/19   [provider]  lisinopril (ZESTRIL) 10 MG tablet Take 10 mg by mouth daily. 05/13/19  [provider]  loratadine (CLARITIN) 10 MG tablet Take 10 mg by mouth daily.    [provider]  metFORMIN (GLUCOPHAGE-XR) 500 MG 24 hr tablet Take 1,000 mg by mouth 2 (two) times daily. 09/17/19   [provider]  Multiple Vitamin (MULTIVITAMIN WITH MINERALS) TABS tablet Take 1 tablet by mouth daily.    [provider]  nitroGLYCERIN (NITROSTAT) 0.4 MG SL tablet Place 1 tablet (0.4 mg total) under the tongue every 5 (five) minutes as needed for chest pain. 08/15/19 08/14/20  Marjie Skiff E, PA-C  ONETOUCH VERIO test strip 3 (three) times daily. 08/13/19    [provider]    Family History History reviewed. No pertinent family history.  Social History Social History   Tobacco Use  . Smoking status: Never Smoker  . Smokeless tobacco: Never Used  Substance Use Topics  . Alcohol use: No  . Drug use: No     Allergies   Penicillins and Clindamycin/lincomycin   Review of Systems Review of Systems   Physical Exam Triage Vital Signs ED Triage Vitals  Enc Vitals Group     BP 10/16/19 0849 (!) 127/50     Pulse Rate 10/16/19 0849 85     Resp 10/16/19 0849 19     Temp 10/16/19 0849 99.6 F (37.6 C)     Temp Source 10/16/19 0849 Oral     SpO2 10/16/19 0849 100 %     Weight --      Height --      Head Circumference --      Peak Flow --      Pain Score 10/16/19 0847 0     Pain Loc --      Pain Edu? --      Excl. in GC? --    No data found.  Updated Vital Signs BP (!) 127/50 (BP Location: Right Arm)   Pulse 85   Temp 99.6 F (37.6 C) (Oral)   Resp 19   LMP 12/31/2012   SpO2 100%   Visual Acuity Right Eye Distance:   Left Eye Distance:   Bilateral Distance:    Right Eye Near:   Left Eye Near:    Bilateral Near:     Physical Exam Vitals and nursing note reviewed.  Constitutional:      General: She is not in acute distress.    Appearance: Normal appearance. She is not ill-appearing, toxic-appearing or diaphoretic.  HENT:     Head: Normocephalic.     Right Ear: Tympanic membrane and ear canal normal.     Left Ear: Tympanic membrane and ear canal normal.     Nose: Nose normal.     Mouth/Throat:     Pharynx: Oropharynx is clear.  Eyes:     Conjunctiva/sclera: Conjunctivae normal.  Cardiovascular:     Rate and Rhythm: Normal rate and regular rhythm.  Pulmonary:     Effort: Pulmonary effort is normal.     Breath sounds: Normal breath sounds.  Musculoskeletal:        General: Normal range of motion.     Cervical back: Normal range of motion.  Skin:    General: Skin is warm and dry.     Findings:  No rash.  Neurological:     Mental Status: She is alert.  Psychiatric:        Mood and Affect: Mood normal.      UC Treatments / Results  Labs (all labs ordered are listed, but only  abnormal results are displayed) Labs Reviewed  SARS CORONAVIRUS 2 (TAT 6-24 HRS) - Abnormal; Notable for the following components:      Result Value   SARS Coronavirus 2 POSITIVE (*)    All other components within normal limits    EKG   Radiology No results found.  Procedures Procedures (including critical care time)  Medications Ordered in UC Medications - No data to display  Initial Impression / Assessment and Plan / UC Course  I have reviewed the triage vital signs and the nursing notes.  Pertinent labs & imaging results that were available during my care of the patient were reviewed by me and considered in my medical decision making (see chart for details).     Viral illness covid swab pending.  Over-the-counter medicines as needed. Keep taking allergy medication. Follow up as needed for continued or worsening symptoms   Final Clinical Impressions(s) / UC Diagnoses   Final diagnoses:  Viral illness     Discharge Instructions     This is most likely viral Testing you for covid  Do not feel this is pneumonia or bronchitis Keep taking the allergy medicine.  You can take OTC medicines as needed Hot tea and honey.  Follow up as needed for continued or worsening symptoms     ED Prescriptions    None     PDMP not reviewed this encounter.   Dahlia Byes A, NP 10/17/19 [phone]

## 2019-10-16 NOTE — ED Triage Notes (Signed)
Pt presents with cough, chest congestion and body aches x 2 days approx. Theraflu did not gives any relief. Denies fever, chills, sob.

## 2019-10-17 ENCOUNTER — Encounter: Payer: Self-pay | Admitting: Nurse Practitioner

## 2019-10-17 ENCOUNTER — Telehealth (HOSPITAL_COMMUNITY): Payer: Self-pay | Admitting: Family Medicine

## 2019-10-17 ENCOUNTER — Telehealth (HOSPITAL_COMMUNITY): Payer: Self-pay | Admitting: *Deleted

## 2019-10-17 ENCOUNTER — Other Ambulatory Visit (HOSPITAL_COMMUNITY): Payer: Self-pay | Admitting: Nurse Practitioner

## 2019-10-17 ENCOUNTER — Telehealth: Payer: Self-pay | Admitting: Cardiovascular Disease

## 2019-10-17 DIAGNOSIS — U071 COVID-19: Secondary | ICD-10-CM

## 2019-10-17 NOTE — Telephone Encounter (Signed)
Per [name] at Cardiac Rehab, the patient tested positive for covid yesterday and will be out for 10 days.

## 2019-10-17 NOTE — Telephone Encounter (Signed)
Will let Dr Allyson Sabal know .Zack Seal

## 2019-10-17 NOTE — Progress Notes (Signed)
I connected by phone with Monica Daniels on 10/17/2019 at 8:58 AM to discuss the potential use of an new treatment for mild to moderate COVID-19 viral infection in non-hospitalized patients.  This patient is a 59 y.o. female that meets the FDA criteria for Emergency Use Authorization of casirivimab\imdevimab.  Has a (+) direct SARS-CoV-2 viral test result  Has mild or moderate COVID-19   Is ? 59 years of age and weighs ? 40 kg  Is NOT hospitalized due to COVID-19  Is NOT requiring oxygen therapy or requiring an increase in baseline oxygen flow rate due to COVID-19  Is within 10 days of symptom onset  Has at least one of the high risk factor(s) for progression to severe COVID-19 and/or hospitalization as defined in EUA.  Specific high risk criteria : BMI > 25, Diabetes, Cardiovascular disease or hypertension and Other high risk medical condition per CDC:  SVI   Onset 10/15/19. Results in Epic. Vaccinated.    I have spoken and communicated the following to the patient or parent/caregiver:  1. FDA has authorized the emergency use of casirivimab\imdevimab for the treatment of mild to moderate COVID-19 in adults and pediatric patients with positive results of direct SARS-CoV-2 viral testing who are 39 years of age and older weighing at least 40 kg, and who are at high risk for progressing to severe COVID-19 and/or hospitalization.  2. The significant known and potential risks and benefits of casirivimab\imdevimab, and the extent to which such potential risks and benefits are unknown.  3. Information on available alternative treatments and the risks and benefits of those alternatives, including clinical trials.  4. Patients treated with casirivimab\imdevimab should continue to self-isolate and use infection control measures (e.g., wear mask, isolate, social distance, avoid sharing personal items, clean and disinfect "high touch" surfaces, and frequent handwashing) according to CDC guidelines.    5. The patient or parent/caregiver has the option to accept or refuse casirivimab\imdevimab .  After reviewing this information with the patient, The patient agreed to proceed with receiving casirivimab\imdevimab infusion and will be provided a copy of the Fact sheet prior to receiving the infusion.Consuello Masse, DNP, AGNP-C [phone] (Infusion Center Hotline)

## 2019-10-17 NOTE — Telephone Encounter (Signed)
Spoke with Monica Daniels reports that she is feeling better this morning. Patient will stay out for 10 days and 72 hours without symptoms. Will  Notify Dr Hazle Coca office. Patient states understanding. Will follow up with the patient next week.Gladstone Lighter, RN,BSN 10/17/2019 8:48 AM

## 2019-10-18 ENCOUNTER — Ambulatory Visit (HOSPITAL_COMMUNITY)
Admission: RE | Admit: 2019-10-18 | Discharge: 2019-10-18 | Disposition: A | Discharge status: Home / Self Care | Payer: Federal, State, Local not specified - PPO | Source: Ambulatory Visit | Attending: Pulmonary Disease | Admitting: Pulmonary Disease

## 2019-10-18 ENCOUNTER — Encounter (HOSPITAL_COMMUNITY): Payer: Federal, State, Local not specified - PPO

## 2019-10-18 DIAGNOSIS — U071 COVID-19: Secondary | ICD-10-CM | POA: Insufficient documentation

## 2019-10-18 MED ORDER — FAMOTIDINE IN NACL 20-0.9 MG/50ML-% IV SOLN: 20.0000 mg | Freq: Once | INTRAVENOUS | Status: DC | PRN

## 2019-10-18 MED ORDER — EPINEPHRINE 0.3 MG/0.3ML IJ SOAJ: 0.3000 mg | Freq: Once | INTRAMUSCULAR | Status: DC | PRN

## 2019-10-18 MED ORDER — DIPHENHYDRAMINE HCL 50 MG/ML IJ SOLN: 50.0000 mg | Freq: Once | INTRAMUSCULAR | Status: DC | PRN

## 2019-10-18 MED ORDER — ALBUTEROL SULFATE HFA 108 (90 BASE) MCG/ACT IN AERS: 2.0000 | INHALATION_SPRAY | Freq: Once | RESPIRATORY_TRACT | Status: DC | PRN

## 2019-10-18 MED ORDER — METHYLPREDNISOLONE SODIUM SUCC 125 MG IJ SOLR: 125.0000 mg | Freq: Once | INTRAMUSCULAR | Status: DC | PRN

## 2019-10-18 MED ORDER — SODIUM CHLORIDE 0.9 % IV SOLN: INTRAVENOUS | Status: DC | PRN

## 2019-10-18 MED ORDER — SODIUM CHLORIDE 0.9 % IV SOLN
1200.0000 mg | Freq: Once | INTRAVENOUS | Status: AC
  Administered 2019-10-18: 1200 mg via INTRAVENOUS

## 2019-10-18 NOTE — Progress Notes (Signed)
  Diagnosis: COVID-19  Physician: Dr Wright  Procedure: [facility] Infusion [facility] Med: casirivimab\imdevimab infusion - Provided patient with casirivimab\imdevimab fact sheet for patients, parents and caregivers prior to infusion.  Complications: No immediate complications noted.  Discharge: Discharged home   Blakeley Margraf Ilemeh 10/18/2019   

## 2019-10-18 NOTE — Discharge Instructions (Signed)
mononucleosis   10 Things You Can Do to Manage Your COVID-19 Symptoms at Home If you have possible or confirmed COVID-19: 1. Stay home from work and school. And stay away from other public places. If you must go out, avoid using any kind of public transportation, ridesharing, or taxis. 2. Monitor your symptoms carefully. If your symptoms get worse, call your healthcare provider immediately. 3. Get rest and stay hydrated. 4. If you have a medical appointment, call the healthcare provider ahead of time and tell them that you have or may have COVID-19. 5. For medical emergencies, call 911 and notify the dispatch personnel that you have or may have COVID-19. 6. Cover your cough and sneezes with a tissue or use the inside of your elbow. 7. Wash your hands often with soap and water for at least 20 seconds or clean your hands with an alcohol-based hand sanitizer that contains at least 60% alcohol. 8. As much as possible, stay in a specific room and away from other people in your home. Also, you should use a separate bathroom, if available. If you need to be around other people in or outside of the home, wear a mask. 9. Avoid sharing personal items with other people in your household, like dishes, towels, and bedding. 10. Clean all surfaces that are touched often, like counters, tabletops, and doorknobs. Use household cleaning sprays or wipes according to the label instructions. [email or web address] What types of side effects do monoclonal antibody drugs cause?  Common side effects  In general, the more common side effects caused by monoclonal antibody drugs include: . Allergic reactions, such as hives or itching . Flu-like signs and symptoms, including chills, fatigue, fever, and muscle aches and pains . Nausea, vomiting . Diarrhea . Skin rashes . Low blood pressure   The CDC is recommending patients who receive monoclonal antibody treatments wait at least 90 days before being  vaccinated.  Currently, there are no data on the safety and efficacy of mRNA COVID-19 vaccines in persons who received monoclonal antibodies or convalescent plasma as part of COVID-19 treatment. Based on the estimated half-life of such therapies as well as evidence suggesting that reinfection is uncommon in the 90 days after initial infection, vaccination should be deferred for at least 90 days, as a precautionary measure until additional information becomes available, to avoid interference of the antibody treatment with vaccine-induced immune responses.  07/25/2018 This information is not intended to replace advice given to you by your health care provider. Make sure you discuss any questions you have with your health care provider. Document Revised: 12/27/2018 Document Reviewed: 12/27/2018 Elsevier Patient Education  2020 [employer].

## 2019-10-21 ENCOUNTER — Encounter (HOSPITAL_COMMUNITY): Payer: Federal, State, Local not specified - PPO

## 2019-10-22 ENCOUNTER — Encounter (HOSPITAL_COMMUNITY): Payer: Self-pay | Admitting: *Deleted

## 2019-10-22 DIAGNOSIS — Z955 Presence of coronary angioplasty implant and graft: Secondary | ICD-10-CM

## 2019-10-22 NOTE — Progress Notes (Signed)
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  aspirin 81 MG EC tablet, TAKE 1 TABLET BY MOUTH DAILY. SWALLOW WHOLE., Disp: 30 tablet, Rfl: 12 .  atorvastatin (LIPITOR) 10 MG tablet, Take 10 mg by mouth daily., Disp: , Rfl:  .  B-D UF III MINI PEN NEEDLES 31G X 5 MM MISC, USE TO INJECT LANTUS DAILY AS DIRECTED, Disp: , Rfl:  .  clopidogrel (PLAVIX) 75 MG tablet, Take 1 tablet (75 mg total) by mouth daily., Disp: 30 tablet, Rfl: 11 .  cyanocobalamin 1000 MCG tablet, Take 1,000 mcg by mouth daily. , Disp: , Rfl:  .  Dulaglutide (TRULICITY) 0.75 MG/0.5ML SOPN, Inject 0.75 mg into the skin once a week., Disp: , Rfl:  .  ezetimibe (ZETIA) 10 MG tablet, Take 1 tablet (10 mg total) by mouth daily., Disp: 90 tablet, Rfl: 3 .  FARXIGA 10 MG TABS tablet, Take 10 mg by mouth daily., Disp: , Rfl:  .  insulin glargine (LANTUS) 100 UNIT/ML injection, Inject 13 Units into the skin at bedtime. , Disp: , Rfl:  .  latanoprost (XALATAN) 0.005 % ophthalmic solution, Place 1 drop into both eyes at bedtime. , Disp: , Rfl:  .  lisinopril (ZESTRIL) 10 MG tablet, Take 10 mg by mouth daily., Disp: , Rfl:  .  loratadine (CLARITIN) 10 MG tablet, Take 10 mg by mouth daily., Disp: , Rfl:  .  metFORMIN (GLUCOPHAGE-XR) 500 MG 24 hr tablet, Take 1,000 mg by mouth 2 (two) times daily., Disp: , Rfl:  .  Multiple Vitamin (MULTIVITAMIN WITH MINERALS) TABS tablet, Take 1 tablet by mouth daily., Disp: , Rfl:  .   nitroGLYCERIN (NITROSTAT) 0.4 MG SL tablet, Place 1 tablet (0.4 mg total) under the tongue every 5 (five) minutes as needed for chest pain., Disp: 25 tablet, Rfl: 2 .  ONETOUCH VERIO test strip, 3 (three) times daily., Disp: , Rfl:   Past Medical History: Past Medical History:  Diagnosis Date  . Diabetes mellitus without complication (HCC)   . Hypertension     Tobacco Use: Social History   Tobacco Use  Smoking Status Never Smoker  Smokeless Tobacco Never Used    Labs: Recent Review Advice worker    Labs for ITP Cardiac and Pulmonary Rehab Latest Ref Rng & Units 07/24/2019   Cholestrol 100 - 199 mg/dL 536   LDLCALC 0 - 99 mg/dL 644(I)   HDL >34 mg/dL 58   Trlycerides 0 - 742 mg/dL 75      Capillary Blood Glucose: Lab Results  Component Value Date   GLUCAP 150 (H) 10/07/2019   GLUCAP 122 (H) 09/25/2019   GLUCAP 117 (H) 09/25/2019   GLUCAP 181 (H) 09/23/2019   GLUCAP 177 (H) 09/23/2019     Exercise Target Goals: Exercise Program Goal: Individual exercise prescription set using results from initial 6 min walk test and THRR while considering  patient's activity barriers and safety.   Exercise Prescription Goal: Starting with aerobic  activity 30 plus minutes a day, 3 days per week for initial exercise prescription. Provide home exercise prescription and guidelines that participant acknowledges understanding prior to discharge.  Activity Barriers & Risk Stratification:  Activity Barriers & Cardiac Risk Stratification - 09/19/19 1127      Activity Barriers & Cardiac Risk Stratification   Activity Barriers Other (comment)    Comments right foot plantar fasciitis    Cardiac Risk Stratification Moderate           6 Minute Walk:  6 Minute Walk    Row Name 09/19/19 1120         6 Minute Walk   Phase Initial     Distance 1302 feet     Walk Time 6 minutes     # of Rest Breaks 0     MPH 2.5     METS 2.54     RPE 12     Perceived Dyspnea  0     VO2 Peak 8.9       Symptoms No     Resting HR 85 bpm     Resting BP 104/62     Resting Oxygen Saturation  98 %     Exercise Oxygen Saturation  during 6 min walk 99 %     Max Ex. HR 114 bpm     Max Ex. BP 112/70     2 Minute Post BP 104/64            Oxygen Initial Assessment:   Oxygen Re-Evaluation:   Oxygen Discharge (Final Oxygen Re-Evaluation):   Initial Exercise Prescription:  Initial Exercise Prescription - 09/19/19 1100      Date of Initial Exercise RX and Referring Provider   Date 09/19/19    Referring Provider [name] MD    Expected Discharge Date 11/15/19      NuStep   Level 2    SPM 75    Minutes 15    METs 2.3      Track   Laps 13    Minutes 15    METs 2.5      Prescription Details   Frequency (times per week) 3x    Duration Progress to 10 minutes continuous walking  at current work load and total walking time to 30-45 min      Intensity   THRR 40-80% of Max Heartrate 64-127    Ratings of Perceived Exertion 11-13    Perceived Dyspnea 0-4      Progression   Progression Continue progressive overload as per policy without signs/symptoms or physical distress.      Resistance Training   Training Prescription Yes    Weight 3lbs    Reps 10-15           Perform Capillary Blood Glucose checks as needed.  Exercise Prescription Changes:   Exercise Prescription Changes    Row Name 09/23/19 1000 10/02/19 1320 10/14/19 1000         Response to Exercise   Blood Pressure (Admit) 110/52 118/50 126/50     Blood Pressure (Exercise) 140/80 124/80 130/60     Blood Pressure (Exit) 104/72 106/70 104/70     Heart Rate (Admit) 93 bpm 85 bpm 94 bpm     Heart Rate (Exercise) 120 bpm 104 bpm 114 bpm     Heart Rate (Exit) 90 bpm 91 bpm 98 bpm     Rating of Perceived Exertion (Exercise) 12 13 11      Perceived Dyspnea (Exercise) 0 0  0     Symptoms None None None     Comments Pt's first day of exercise  None Reviewed Home Exercise Plan     Duration Progress to 30  minutes of  aerobic without signs/symptoms of physical distress Continue with 30 min of aerobic exercise without signs/symptoms of physical distress. Progress to 30 minutes of  aerobic without signs/symptoms of physical distress     Intensity THRR unchanged THRR unchanged THRR unchanged       Progression   Progression Continue to progress workloads to maintain intensity without signs/symptoms of physical distress. Continue to progress workloads to maintain intensity without signs/symptoms of physical distress. Continue to progress workloads to maintain intensity without signs/symptoms of physical distress.     Average METs 2.2 2.4 2.4       Resistance Training   Training Prescription Yes No Yes     Weight 3lbs -- 3lbs     Reps 10-15 -- 10-15     Time 10 Minutes -- 10 Minutes       Interval Training   Interval Training No No No       NuStep   Level SPM 70 80 85     Minutes METs 1.9 2.2 2       Track   Laps Minutes METs 2.4 2.51 2.4       Home Exercise Plan   Plans to continue exercise at -- -- Home (comment)  Walking     Frequency -- -- Add 2 additional days to program exercise sessions.     Initial Home Exercises Provided -- -- 10/14/19            Exercise Comments:   Exercise Comments    Row Name 09/23/19 1003 10/03/19 1321 10/14/19 1025 10/24/19 0700     Exercise Comments Pt's first day of exercise. Pt responded well to exercise prescription. Oriented pt to exercise equipment. Will continue to monitor and progress pt as tolerated. Pt is continuing to respond well to exercise workloads. Will follow up with pt regarding home exercise plan. Reviewed Home Exercise Plan with pt. Pt has been doing stretches and hand weights at home. Pt agreeable to add walking at home 2 days a week, starting with 15 minutes. Pt currently on hold due to testing positive for COVID. Pt planning to return once cleared.           Exercise Goals  and Review:   Exercise Goals    Row Name 09/19/19 1129             Exercise Goals   Increase Physical Activity Yes       Intervention Provide advice, education, support and counseling about physical activity/exercise needs.;Develop an individualized exercise prescription for aerobic and resistive training based on initial evaluation findings, risk stratification, comorbidities and participant's personal goals.       Expected Outcomes Short Term: Attend rehab on a regular basis to increase amount of physical activity.;Long Term: Add in home exercise to make exercise part of routine and to increase amount of physical activity.;Long Term: Exercising regularly at least 3-5 days a week.       Increase Strength and Stamina Yes       Intervention Provide advice, education, support and counseling about physical activity/exercise needs.;Develop an individualized exercise prescription for aerobic and resistive training based  on initial evaluation findings, risk stratification, comorbidities and participant's personal goals.       Expected Outcomes Short Term: Perform resistance training exercises routinely during rehab and add in resistance training at home;Short Term: Increase workloads from initial exercise prescription for resistance, speed, and METs.;Long Term: Improve cardiorespiratory fitness, muscular endurance and strength as measured by increased METs and functional capacity ( )       Able to understand and use rate of perceived exertion (RPE) scale Yes       Intervention Provide education and explanation on how to use RPE scale       Expected Outcomes Short Term: Able to use RPE daily in rehab to express subjective intensity level;Long Term:  Able to use RPE to guide intensity level when exercising independently       Knowledge and understanding of Target Heart Rate Range (THRR) Yes       Intervention Provide education and explanation of THRR including how the numbers were predicted and where  they are located for reference       Expected Outcomes Short Term: Able to state/look up THRR;Short Term: Able to use daily as guideline for intensity in rehab;Long Term: Able to use THRR to govern intensity when exercising independently       Able to check pulse independently Yes       Intervention Provide education and demonstration on how to check pulse in carotid and radial arteries.;Review the importance of being able to check your own pulse for safety during independent exercise       Expected Outcomes Short Term: Able to explain why pulse checking is important during independent exercise;Long Term: Able to check pulse independently and accurately       Understanding of Exercise Prescription Yes       Intervention Provide education, explanation, and written materials on patient's individual exercise prescription       Expected Outcomes Short Term: Able to explain program exercise prescription;Long Term: Able to explain home exercise prescription to exercise independently              Exercise Goals Re-Evaluation :  Exercise Goals Re-Evaluation    Row Name 09/23/19 1004 10/14/19 1026           Exercise Goal Re-Evaluation   Exercise Goals Review Increase Physical Activity;Knowledge and understanding of Target Heart Rate Range (THRR);Able to understand and use rate of perceived exertion (RPE) scale;Increase Strength and Stamina Increase Physical Activity;Increase Strength and Stamina;Able to understand and use rate of perceived exertion (RPE) scale;Knowledge and understanding of Target Heart Rate Range (THRR);Able to check pulse independently;Understanding of Exercise Prescription      Comments Pt's first day of exercise. Pt able to exercise 30 minutes with minimal difficulty. Will continute to monitor. Reviewed HEP with pt. Also discussed THRR, RPE Scale, weather conditions, endpoints of exercise, NTG use, warmup and cool down. Also reviewed METs and progress with pt.      Expected  Outcomes Pt will continue to increase cardiovascular fitness and stamina. Pt will plan to add 2 days of walking at home for15 minutes. With a goal of 30 minutes eventually.              Discharge Exercise Prescription (Final Exercise Prescription Changes):  Exercise Prescription Changes - 10/14/19 1000      Response to Exercise   Blood Pressure (Admit) 126/50    Blood Pressure (Exercise) 130/60    Blood Pressure (Exit) 104/70    Heart Rate (Admit) 94 bpm  Heart Rate (Exercise) 114 bpm    Heart Rate (Exit) 98 bpm    Rating of Perceived Exertion (Exercise) 11    Perceived Dyspnea (Exercise) 0    Symptoms None    Comments Reviewed Home Exercise Plan    Duration Progress to 30 minutes of  aerobic without signs/symptoms of physical distress    Intensity THRR unchanged      Progression   Progression Continue to progress workloads to maintain intensity without signs/symptoms of physical distress.    Average METs 2.4      Resistance Training   Training Prescription Yes    Weight 3lbs    Reps 10-15    Time 10 Minutes      Interval Training   Interval Training No      NuStep   Level 3    SPM 85    Minutes 15    METs 2      Track   Laps 12    Minutes 15    METs 2.4      Home Exercise Plan   Plans to continue exercise at Home (comment)   Walking   Frequency Add 2 additional days to program exercise sessions.    Initial Home Exercises Provided 10/14/19           Nutrition:  Target Goals: Understanding of nutrition guidelines, daily intake of sodium 1500mg , cholesterol 200mg , calories 30% from fat and 7% or less from saturated fats, daily to have 5 or more servings of fruits and vegetables.  Biometrics:  Pre Biometrics - 09/19/19 1126      Pre Biometrics   Height 5\' 7"  (1.702 m)    Weight 266 lb 15.6 oz (121.1 kg)    Waist Circumference 48 inches    Hip Circumference 54.5 inches    Waist to Hip Ratio 0.88 %    BMI (Calculated) 41.8    Triceps Skinfold 33 mm     % Body Fat 50.9 %    Grip Strength 29 kg    Flexibility 11 in    Single Leg Stand 13.89 seconds            Nutrition Therapy Plan and Nutrition Goals:  Nutrition Therapy & Goals - 09/25/19 1041      Nutrition Therapy   Diet Heart Healthy    Drug/Food Interactions Statins/Certain Fruits      Personal Nutrition Goals   Nutrition Goal Improved blood glucose control as evidenced by pt's A1c trending from 8.5 toward less than 7.0.    Personal Goal #2 CBG concentrations in the normal range or as close to normal as is safely possible.    Personal Goal #3 Pt able to name foods that affect blood glucose      Intervention Plan   Intervention Prescribe, educate and counsel regarding individualized specific dietary modifications aiming towards targeted core components such as weight, hypertension, lipid management, diabetes, heart failure and other comorbidities.    Expected Outcomes Short Term Goal: A plan has been developed with personal nutrition goals set during dietitian appointment.           Nutrition Assessments:  Nutrition Assessments - 10/15/19 1031      MEDFICTS Scores   Pre Score 24           Nutrition Goals Re-Evaluation:  Nutrition Goals Re-Evaluation    Row Name 09/25/19 1042 10/22/19 0910           Goals   Current Weight 266 lb (120.7 kg)  265 lb 6.9 oz (120.4 kg)      Nutrition Goal Improved blood glucose control as evidenced by pt's A1c trending from 8.5 toward less than 7.0. Improved blood glucose control as evidenced by pt's A1c trending from 8.5 toward less than 7.0.      Comment -- Reviewed heart healthy diet, pt cooks at home and making healthy food choices        Personal Goal #2 Re-Evaluation   Personal Goal #2 CBG concentrations in the normal range or as close to normal as is safely possible. CBG concentrations in the normal range or as close to normal as is safely possible.        Personal Goal #3 Re-Evaluation   Personal Goal #3 Pt able to  name foods that affect blood glucose Pt able to name foods that affect blood glucose             Nutrition Goals Discharge (Final Nutrition Goals Re-Evaluation):  Nutrition Goals Re-Evaluation - 10/22/19 0910      Goals   Current Weight 265 lb 6.9 oz (120.4 kg)    Nutrition Goal Improved blood glucose control as evidenced by pt's A1c trending from 8.5 toward less than 7.0.    Comment Reviewed heart healthy diet, pt cooks at home and making healthy food choices      Personal Goal #2 Re-Evaluation   Personal Goal #2 CBG concentrations in the normal range or as close to normal as is safely possible.      Personal Goal #3 Re-Evaluation   Personal Goal #3 Pt able to name foods that affect blood glucose           Psychosocial: Target Goals: Acknowledge presence or absence of significant depression and/or stress, maximize coping skills, provide positive support system. Participant is able to verbalize types and ability to use techniques and skills needed for reducing stress and depression.  Initial Review & Psychosocial Screening:  Initial Psych Review & Screening - 09/19/19 1223      Initial Review   Current issues with None Identified      Family Dynamics   Good Support System? Yes   Monica Daniels lives alone but has two brothers who live nearby for support     Barriers   Psychosocial barriers to participate in program There are no identifiable barriers or psychosocial needs.      Screening Interventions   Interventions Encouraged to exercise           Quality of Life Scores:  Quality of Life - 09/19/19 1114      Quality of Life   Select Quality of Life      Quality of Life Scores   Health/Function Pre 25.73 %    Socioeconomic Pre 30 %    Psych/Spiritual Pre 27.43 %    Family Pre 27 %    GLOBAL Pre 27.16 %          Scores of 19 and below usually indicate a poorer quality of life in these areas.  A difference of  2-3 points is a clinically meaningful difference.  A  difference of 2-3 points in the total score of the Quality of Life Index has been associated with significant improvement in overall quality of life, self-image, physical symptoms, and general health in studies assessing change in quality of life.  PHQ-9: Recent Review Flowsheet Data    Depression screen [facility]/2019   Decreased Interest 0   Down, Depressed, Hopeless 0   PHQ -  2 Score 0     Interpretation of Total Score  Total Score Depression Severity:  1-4 = Minimal depression, 5-9 = Mild depression, 10-14 = Moderate depression, 15-19 = Moderately severe depression, 20-27 = Severe depression   Psychosocial Evaluation and Intervention:   Psychosocial Re-Evaluation:  Psychosocial Re-Evaluation    Row Name 09/23/19 1429 10/22/19 1445           Psychosocial Re-Evaluation   Current issues with None Identified --      Comments -- unable to reassess as exercise is currently on hold.      Interventions Encouraged to attend Cardiac Rehabilitation for the exercise --      Continue Psychosocial Services  No Follow up required --             Psychosocial Discharge (Final Psychosocial Re-Evaluation):  Psychosocial Re-Evaluation - 10/22/19 1445      Psychosocial Re-Evaluation   Comments unable to reassess as exercise is currently on hold.           Vocational Rehabilitation: Provide vocational rehab assistance to qualifying candidates.   Vocational Rehab Evaluation & Intervention:  Vocational Rehab - 09/19/19 1225      Initial Vocational Rehab Evaluation & Intervention   Assessment shows need for Vocational Rehabilitation No   Monica Daniels is retired and does not need vocational rehab at this time          Education: Education Goals: Education classes will be provided on a weekly basis, covering required topics. Participant will state understanding/return demonstration of topics presented.  Learning Barriers/Preferences:  Learning Barriers/Preferences - 09/19/19 1115       Learning Barriers/Preferences   Learning Barriers Sight    Learning Preferences Written Material;Skilled Demonstration;Individual Instruction           Education Topics: Hypertension, Hypertension Reduction -Define heart disease and high blood pressure. Discus how high blood pressure affects the body and ways to reduce high blood pressure.   Exercise and Your Heart -Discuss why it is important to exercise, the FITT principles of exercise, normal and abnormal responses to exercise, and how to exercise safely.   Angina -Discuss definition of angina, causes of angina, treatment of angina, and how to decrease risk of having angina.   Cardiac Medications -Review what the following cardiac medications are used for, how they affect the body, and side effects that may occur when taking the medications.  Medications include Aspirin, Beta blockers, calcium channel blockers, ACE Inhibitors, angiotensin receptor blockers, diuretics, digoxin, and antihyperlipidemics.   Congestive Heart Failure -Discuss the definition of CHF, how to live with CHF, the signs and symptoms of CHF, and how keep track of weight and sodium intake.   Heart Disease and Intimacy -Discus the effect sexual activity has on the heart, how changes occur during intimacy as we age, and safety during sexual activity.   Smoking Cessation / COPD -Discuss different methods to quit smoking, the health benefits of quitting smoking, and the definition of COPD.   Nutrition I: Fats -Discuss the types of cholesterol, what cholesterol does to the heart, and how cholesterol levels can be controlled.   Nutrition II: Labels -Discuss the different components of food labels and how to read food label   Heart Parts/Heart Disease and PAD -Discuss the anatomy of the heart, the pathway of blood circulation through the heart, and these are affected by heart disease.   Stress I: Signs and Symptoms -Discuss the causes of stress,  how stress may lead to anxiety and depression,  and ways to limit stress.   Stress II: Relaxation -Discuss different types of relaxation techniques to limit stress.   Warning Signs of Stroke / TIA -Discuss definition of a stroke, what the signs and symptoms are of a stroke, and how to identify when someone is having stroke.   Knowledge Questionnaire Score:  Knowledge Questionnaire Score - 09/19/19 1114      Knowledge Questionnaire Score   Pre Score 19/24           Core Components/Risk Factors/Patient Goals at Admission:  Personal Goals and Risk Factors at Admission - 09/19/19 1116      Core Components/Risk Factors/Patient Goals on Admission    Weight Management Yes;Weight Loss    Intervention Weight Management: Develop a combined nutrition and exercise program designed to reach desired caloric intake, while maintaining appropriate intake of nutrient and fiber, sodium and fats, and appropriate energy expenditure required for the weight goal.;Weight Management: Provide education and appropriate resources to help participant work on and attain dietary goals.;Weight Management/Obesity: Establish reasonable short term and long term weight goals.;Obesity: Provide education and appropriate resources to help participant work on and attain dietary goals.    Admit Weight 266 lb 6.7 oz (120.8 kg)    Goal Weight: Long Term 200 lb (90.7 kg)    Expected Outcomes Short Term: Continue to assess and modify interventions until short term weight is achieved;Long Term: Adherence to nutrition and physical activity/exercise program aimed toward attainment of established weight goal;Weight Loss: Understanding of general recommendations for a balanced deficit meal plan, which promotes 1-2 lb weight loss per week and includes a negative energy balance of [phone] kcal/d;Understanding recommendations for meals to include 15-35% energy as protein, 25-35% energy from fat, 35-60% energy from carbohydrates, less than  200mg  of dietary cholesterol, 20-35 gm of total fiber daily;Understanding of distribution of calorie intake throughout the day with the consumption of 4-5 meals/snacks    Diabetes Yes    Intervention Provide education about signs/symptoms and action to take for hypo/hyperglycemia.;Provide education about proper nutrition, including hydration, and aerobic/resistive exercise prescription along with prescribed medications to achieve blood glucose in normal ranges: Fasting glucose 65-99 mg/dL    Expected Outcomes Short Term: Participant verbalizes understanding of the signs/symptoms and immediate care of hyper/hypoglycemia, proper foot care and importance of medication, aerobic/resistive exercise and nutrition plan for blood glucose control.;Long Term: Attainment of HbA1C < 7%.    Hypertension Yes    Intervention Provide education on lifestyle modifcations including regular physical activity/exercise, weight management, moderate sodium restriction and increased consumption of fresh fruit, vegetables, and low fat dairy, alcohol moderation, and smoking cessation.;Monitor prescription use compliance.    Expected Outcomes Short Term: Continued assessment and intervention until BP is < 140/48mm HG in hypertensive participants. < 130/65mm HG in hypertensive participants with diabetes, heart failure or chronic kidney disease.;Long Term: Maintenance of blood pressure at goal levels.    Lipids Yes    Intervention Provide education and support for participant on nutrition & aerobic/resistive exercise along with prescribed medications to achieve LDL 70mg , HDL >40mg .    Expected Outcomes Short Term: Participant states understanding of desired cholesterol values and is compliant with medications prescribed. Participant is following exercise prescription and nutrition guidelines.;Long Term: Cholesterol controlled with medications as prescribed, with individualized exercise RX and with personalized nutrition plan. Value  goals: LDL < 70mg , HDL > 40 mg.           Core Components/Risk Factors/Patient Goals Review:   Goals and Risk Factor Review  Row Name 09/23/19 1443 10/01/19 1614 10/22/19 1446         Core Components/Risk Factors/Patient Goals Review   Personal Goals Review Weight Management/Obesity;Lipids;Diabetes;Hypertension Weight Management/Obesity;Lipids;Diabetes;Hypertension Weight Management/Obesity;Lipids;Diabetes;Hypertension     Review Monica Daniels started exercise at cardiac rehab on 8/30/2. Patient tolerated light exercise without difficulty Monica Daniels is off to a good start to exercise. Monica Daniels's vital signs and CBG's have been stable Monica Daniels's vital signs and CBG's were stable at cardiac rehab. Monica Daniels did have issues with claudication when walking on the track and takes rest breaks as needed. Monica Daniels's exercise is currently on hold     Expected Outcomes Monica Daniels will continue to participate in phase 2 cardiac rehab for exercise, nutrition and lifestyle modifications. Monica Daniels will continue to participate in phase 2 cardiac rehab for exercise, nutrition and lifestyle modifications. Monica Daniels will continue to participate in phase 2 cardiac rehab for exercise, nutrition and lifestyle modifications.            Core Components/Risk Factors/Patient Goals at Discharge (Final Review):   Goals and Risk Factor Review - 10/22/19 1446      Core Components/Risk Factors/Patient Goals Review   Personal Goals Review Weight Management/Obesity;Lipids;Diabetes;Hypertension    Review Monica Daniels's vital signs and CBG's were stable at cardiac rehab. Monica Daniels did have issues with claudication when walking on the track and takes rest breaks as needed. Monica Daniels's exercise is currently on hold    Expected Outcomes Monica Daniels will continue to participate in phase 2 cardiac rehab for exercise, nutrition and lifestyle modifications.           ITP Comments:  ITP Comments    Row Name 09/19/19 1110 09/19/19 1218 10/01/19 1611  10/22/19 1441     ITP Comments Dr. Armanda Magic, Medical Director Dr. Armanda Magic MD, Medical Director 30 Day ITP Review. Geraldina is off to a good start to exercise. Patient has good participation and attendance in phase 2 cardiac rehab 30 Day ITP Review.Morgane exercise is currently on hold as she has tested positive for COVID 19. Hopefully Amayah will return to exercise on 10/28/19           Comments: See ITP comments.[place] Lighter, RN,BSN 10/24/2019 12:26 PM

## 2019-10-23 ENCOUNTER — Encounter (HOSPITAL_COMMUNITY): Payer: Federal, State, Local not specified - PPO

## 2019-10-23 DIAGNOSIS — G4733 Obstructive sleep apnea (adult) (pediatric): Secondary | ICD-10-CM | POA: Diagnosis not present

## 2019-10-25 ENCOUNTER — Encounter (HOSPITAL_COMMUNITY): Payer: Federal, State, Local not specified - PPO

## 2019-10-28 ENCOUNTER — Telehealth (HOSPITAL_COMMUNITY): Payer: Self-pay | Admitting: *Deleted

## 2019-10-28 ENCOUNTER — Other Ambulatory Visit: Payer: Self-pay | Admitting: Pharmacist Clinician (PhC)/ Clinical Pharmacy Specialist

## 2019-10-28 ENCOUNTER — Encounter (HOSPITAL_COMMUNITY): Payer: Federal, State, Local not specified - PPO

## 2019-10-28 ENCOUNTER — Other Ambulatory Visit: Payer: Federal, State, Local not specified - PPO

## 2019-10-28 DIAGNOSIS — E782 Mixed hyperlipidemia: Secondary | ICD-10-CM

## 2019-10-28 NOTE — Telephone Encounter (Signed)
Spoke with Vasti she is feeling better except an occasional cough. Will follow up with Dacy on Wednesday as she will need to be symptom free before retuning to exercise at cardiac rehab. Patient states understanding.[place] Lighter, RN,BSN 10/28/2019 11:50 AM

## 2019-10-30 ENCOUNTER — Telehealth (HOSPITAL_COMMUNITY): Payer: Self-pay | Admitting: *Deleted

## 2019-10-30 ENCOUNTER — Encounter (HOSPITAL_COMMUNITY): Payer: Federal, State, Local not specified - PPO

## 2019-10-30 NOTE — Telephone Encounter (Signed)
Spoke with Monica Daniels her cough is almost completely gone. Monica Daniels plans to return on 11/04/19.Gl[place] Lighter, RN,BSN 10/30/2019 4:39 PM

## 2019-11-01 ENCOUNTER — Encounter (HOSPITAL_COMMUNITY): Payer: Federal, State, Local not specified - PPO

## 2019-11-01 DIAGNOSIS — R310 Gross hematuria: Secondary | ICD-10-CM | POA: Diagnosis not present

## 2019-11-04 ENCOUNTER — Telehealth (HOSPITAL_COMMUNITY): Payer: Self-pay | Admitting: Family Medicine

## 2019-11-04 ENCOUNTER — Encounter (HOSPITAL_COMMUNITY): Payer: Federal, State, Local not specified - PPO

## 2019-11-04 ENCOUNTER — Other Ambulatory Visit: Payer: Federal, State, Local not specified - PPO

## 2019-11-04 DIAGNOSIS — R310 Gross hematuria: Secondary | ICD-10-CM | POA: Diagnosis not present

## 2019-11-04 DIAGNOSIS — Z20822 Contact with and (suspected) exposure to covid-19: Secondary | ICD-10-CM | POA: Diagnosis not present

## 2019-11-05 LAB — NOVEL CORONAVIRUS, NAA: SARS-CoV-2, NAA: NOT DETECTED

## 2019-11-05 LAB — SARS-COV-2, NAA 2 DAY TAT

## 2019-11-06 ENCOUNTER — Telehealth (HOSPITAL_COMMUNITY): Payer: Self-pay | Admitting: Family Medicine

## 2019-11-06 ENCOUNTER — Encounter (HOSPITAL_COMMUNITY): Payer: Federal, State, Local not specified - PPO

## 2019-11-08 ENCOUNTER — Telehealth (HOSPITAL_COMMUNITY): Payer: Self-pay | Admitting: *Deleted

## 2019-11-08 ENCOUNTER — Encounter (HOSPITAL_COMMUNITY): Payer: Federal, State, Local not specified - PPO

## 2019-11-08 ENCOUNTER — Other Ambulatory Visit: Payer: Self-pay | Admitting: Cardiology

## 2019-11-08 NOTE — Telephone Encounter (Signed)
Spoke with Monica Daniels. Monica Daniels had a negative Covid 19 test on 11/04/19 and does not have a cough anymore. Monica Daniels plans to return to exercise on Monday.[place] Lighter, RN,BSN 11/08/2019 2:02 PM

## 2019-11-11 ENCOUNTER — Other Ambulatory Visit: Payer: Self-pay

## 2019-11-11 ENCOUNTER — Encounter (HOSPITAL_COMMUNITY)
Admission: RE | Admit: 2019-11-11 | Discharge: 2019-11-11 | Disposition: A | Discharge status: Home / Self Care | Payer: Federal, State, Local not specified - PPO | Source: Ambulatory Visit | Attending: Cardiovascular Disease | Admitting: Cardiovascular Disease

## 2019-11-11 DIAGNOSIS — Z955 Presence of coronary angioplasty implant and graft: Secondary | ICD-10-CM | POA: Diagnosis not present

## 2019-11-11 NOTE — Progress Notes (Signed)
Daishia returned to exercise at cardiac rehab. Laine took rest breaks when needed due to claudication otherwise tolerated exercise without difficulty.[place] Lighter, RN,BSN 11/11/2019 10:30 AM

## 2019-11-12 ENCOUNTER — Ambulatory Visit (INDEPENDENT_AMBULATORY_CARE_PROVIDER_SITE_OTHER): Payer: Federal, State, Local not specified - PPO | Admitting: Podiatry

## 2019-11-12 ENCOUNTER — Other Ambulatory Visit: Payer: Self-pay | Admitting: Family Medicine

## 2019-11-12 DIAGNOSIS — L84 Corns and callosities: Secondary | ICD-10-CM

## 2019-11-12 DIAGNOSIS — R310 Gross hematuria: Secondary | ICD-10-CM

## 2019-11-12 DIAGNOSIS — E1151 Type 2 diabetes mellitus with diabetic peripheral angiopathy without gangrene: Secondary | ICD-10-CM

## 2019-11-13 ENCOUNTER — Encounter (HOSPITAL_COMMUNITY): Payer: Federal, State, Local not specified - PPO

## 2019-11-13 ENCOUNTER — Telehealth (HOSPITAL_COMMUNITY): Payer: Self-pay

## 2019-11-13 NOTE — Telephone Encounter (Signed)
Pt call and stated that she wasn't going to be able to make her cardiac rehab session on 11/13/2019. Pt didn't give a reason. I canceled her cardiac rehab session for today.

## 2019-11-15 ENCOUNTER — Telehealth: Payer: Self-pay | Admitting: Cardiovascular Disease

## 2019-11-15 ENCOUNTER — Other Ambulatory Visit: Payer: Self-pay

## 2019-11-15 ENCOUNTER — Encounter (HOSPITAL_COMMUNITY)
Admission: RE | Admit: 2019-11-15 | Discharge: 2019-11-15 | Disposition: A | Discharge status: Home / Self Care | Payer: Federal, State, Local not specified - PPO | Source: Ambulatory Visit | Attending: Cardiovascular Disease | Admitting: Cardiovascular Disease

## 2019-11-15 DIAGNOSIS — Z955 Presence of coronary angioplasty implant and graft: Secondary | ICD-10-CM

## 2019-11-15 NOTE — Progress Notes (Signed)
Patient says she stopped taking her Plavix upon review this past Monday as the pharmacy did not refill her medication. Dr Hazle Coca office called and notified. Requested that Dr Hazle Coca office call the patient. 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 Patient reported cramping, claudication in her right leg which is chronic . Otherwise asymptomatic.[place] Lighter, RN,BSN 11/15/2019 10:18 AM

## 2019-11-15 NOTE — Progress Notes (Signed)
Discharge Progress Report  Patient Details  Name: Monica Daniels MRN: [id number] Date of Birth: Dec 09, 1960 Referring Provider:     [facility] from 09/19/2019 in [place]  Referring Provider Quay Burow MD       Number of Visits: 11  Reason for Discharge:  Patient reached a stable level of exercise. Patient independent in their exercise. Patient has met program and personal goals.  Smoking History:  Social History   Tobacco Use  Smoking Status Never Smoker  Smokeless Tobacco Never Used    Diagnosis:  S/P DES RCA , S/P Arthrectomy DES LAD x3 08/15/19  ADL UCSD:   Initial Exercise Prescription:  Initial Exercise Prescription - 09/19/19 1100      Date of Initial Exercise RX and Referring Provider   Date 09/19/19    Referring Provider Quay Burow MD    Expected Discharge Date 11/15/19      NuStep   Level 2    SPM 75    Minutes 15    METs 2.3      Track   Laps 13    Minutes 15    METs 2.5      Prescription Details   Frequency (times per week) 3x    Duration Progress to 10 minutes continuous walking  at current work load and total walking time to 30-45 min      Intensity   THRR 40-80% of Max Heartrate 64-127    Ratings of Perceived Exertion 11-13    Perceived Dyspnea 0-4      Progression   Progression Continue progressive overload as per policy without signs/symptoms or physical distress.      Resistance Training   Training Prescription Yes    Weight 3lbs    Reps 10-15           Discharge Exercise Prescription (Final Exercise Prescription Changes):  Exercise Prescription Changes - 11/15/19 1641      Response to Exercise   Blood Pressure (Admit) 140/60    Blood Pressure (Exercise) 124/60    Blood Pressure (Exit) 118/70    Heart Rate (Admit) 86 bpm    Heart Rate (Exercise) 115 bpm    Heart Rate (Exit) 86 bpm    Rating of Perceived Exertion (Exercise) 12    Perceived Dyspnea  (Exercise) 0    Symptoms None    Comments None    Duration Continue with 30 min of aerobic exercise without signs/symptoms of physical distress.    Intensity THRR unchanged      Progression   Progression Continue to progress workloads to maintain intensity without signs/symptoms of physical distress.    Average METs 2.2      Resistance Training   Training Prescription Yes    Weight 3lbs    Reps 10-15    Time 10 Minutes      Interval Training   Interval Training No      NuStep   Level 3    SPM 85    Minutes 15    METs 1.8      Track   Laps 12    Minutes 15    METs 2.4      Home Exercise Plan   Plans to continue exercise at Home (comment)   Walking   Frequency Add 3 additional days to program exercise sessions.    Initial Home Exercises Provided 10/14/19           Functional Capacity:  6 Minute  Walk    Row Name 09/19/19 1120         6 Minute Walk   Phase Initial     Distance 1302 feet     Walk Time 6 minutes     # of Rest Breaks 0     MPH 2.5     METS 2.54     RPE 12     Perceived Dyspnea  0     VO2 Peak 8.9     Symptoms No     Resting HR 85 bpm     Resting BP 104/62     Resting Oxygen Saturation  98 %     Exercise Oxygen Saturation  during 6 min walk 99 %     Max Ex. HR 114 bpm     Max Ex. BP 112/70     2 Minute Post BP 104/64            Psychological, QOL, Others - Outcomes: PHQ 2/9: Depression screen [facility] 2/9 12/06/2019 11/15/2019 09/19/2019  Decreased Interest 0 0 0  Down, Depressed, Hopeless 0 0 0  PHQ - 2 Score 0 0 0    Quality of Life:  Quality of Life - 09/19/19 1114      Quality of Life   Select Quality of Life      Quality of Life Scores   Health/Function Pre 25.73 %    Socioeconomic Pre 30 %    Psych/Spiritual Pre 27.43 %    Family Pre 27 %    GLOBAL Pre 27.16 %           Personal Goals: Goals established at orientation with interventions provided to work toward goal.  Personal Goals and Risk Factors at Admission -  09/19/19 1116      Core Components/Risk Factors/Patient Goals on Admission    Weight Management Yes;Weight Loss    Intervention Weight Management: Develop a combined nutrition and exercise program designed to reach desired caloric intake, while maintaining appropriate intake of nutrient and fiber, sodium and fats, and appropriate energy expenditure required for the weight goal.;Weight Management: Provide education and appropriate resources to help participant work on and attain dietary goals.;Weight Management/Obesity: Establish reasonable short term and long term weight goals.;Obesity: Provide education and appropriate resources to help participant work on and attain dietary goals.    Admit Weight 266 lb 6.7 oz (120.8 kg)    Goal Weight: Long Term 200 lb (90.7 kg)    Expected Outcomes Short Term: Continue to assess and modify interventions until short term weight is achieved;Long Term: Adherence to nutrition and physical activity/exercise program aimed toward attainment of established weight goal;Weight Loss: Understanding of general recommendations for a balanced deficit meal plan, which promotes 1-2 lb weight loss per week and includes a negative energy balance of [phone] kcal/d;Understanding recommendations for meals to include 15-35% energy as protein, 25-35% energy from fat, 35-60% energy from carbohydrates, less than 253m of dietary cholesterol, 20-35 gm of total fiber daily;Understanding of distribution of calorie intake throughout the day with the consumption of 4-5 meals/snacks    Diabetes Yes    Intervention Provide education about signs/symptoms and action to take for hypo/hyperglycemia.;Provide education about proper nutrition, including hydration, and aerobic/resistive exercise prescription along with prescribed medications to achieve blood glucose in normal ranges: Fasting glucose 65-99 mg/dL    Expected Outcomes Short Term: Participant verbalizes understanding of the signs/symptoms and  immediate care of hyper/hypoglycemia, proper foot care and importance of medication, aerobic/resistive exercise and nutrition plan  for blood glucose control.;Long Term: Attainment of HbA1C < 7%.    Hypertension Yes    Intervention Provide education on lifestyle modifcations including regular physical activity/exercise, weight management, moderate sodium restriction and increased consumption of fresh fruit, vegetables, and low fat dairy, alcohol moderation, and smoking cessation.;Monitor prescription use compliance.    Expected Outcomes Short Term: Continued assessment and intervention until BP is < 140/60m HG in hypertensive participants. < 130/890mHG in hypertensive participants with diabetes, heart failure or chronic kidney disease.;Long Term: Maintenance of blood pressure at goal levels.    Lipids Yes    Intervention Provide education and support for participant on nutrition & aerobic/resistive exercise along with prescribed medications to achieve LDL <7082mHDL >68m54m  Expected Outcomes Short Term: Participant states understanding of desired cholesterol values and is compliant with medications prescribed. Participant is following exercise prescription and nutrition guidelines.;Long Term: Cholesterol controlled with medications as prescribed, with individualized exercise RX and with personalized nutrition plan. Value goals: LDL < 70mg3mL > 40 mg.            Personal Goals Discharge:  Goals and Risk Factor Review    Row Name 09/23/19 1443 10/01/19 1614 10/22/19 1446 12/06/19 1443       Core Components/Risk Factors/Patient Goals Review   Personal Goals Review Weight Management/Obesity;Lipids;Diabetes;Hypertension Weight Management/Obesity;Lipids;Diabetes;Hypertension Weight Management/Obesity;Lipids;Diabetes;Hypertension Weight Management/Obesity;Lipids;Diabetes;Hypertension    Review Mechel started exercise at cardiac rehab on 8/30/2. Patient tolerated light exercise without difficulty  Joselyn is off to a good start to exercise. Loreta's vital signs and CBG's have been stable Tashanti's vital signs and CBG's were stable at cardiac rehab. Yazleen did have issues with claudication when walking on the track and takes rest breaks as needed. Rolonda's exercise is currently on hold Teryn completed cardiac rehab on 11/15/19. Almarie did well with exercise despite her PAD. Jatavia did not complete her post exercise walk test    Expected Outcomes Reisha will continue to participate in phase 2 cardiac rehab for exercise, nutrition and lifestyle modifications. Candid will continue to participate in phase 2 cardiac rehab for exercise, nutrition and lifestyle modifications. Dharma will continue to participate in phase 2 cardiac rehab for exercise, nutrition and lifestyle modifications. Lizanne will continue to exercise upon completion of phase 2 cardiac rehab. Follow nutrutional and lifestyle modifications upon completion of phase 2 cardiac rehab.           Exercise Goals and Review:  Exercise Goals    Row Name 09/19/19 1129             Exercise Goals   Increase Physical Activity Yes       Intervention Provide advice, education, support and counseling about physical activity/exercise needs.;Develop an individualized exercise prescription for aerobic and resistive training based on initial evaluation findings, risk stratification, comorbidities and participant's personal goals.       Expected Outcomes Short Term: Attend rehab on a regular basis to increase amount of physical activity.;Long Term: Add in home exercise to make exercise part of routine and to increase amount of physical activity.;Long Term: Exercising regularly at least 3-5 days a week.       Increase Strength and Stamina Yes       Intervention Provide advice, education, support and counseling about physical activity/exercise needs.;Develop an individualized exercise prescription for aerobic and resistive training based on  initial evaluation findings, risk stratification, comorbidities and participant's personal goals.       Expected Outcomes Short Term: Perform resistance training exercises routinely during  rehab and add in resistance training at home;Short Term: Increase workloads from initial exercise prescription for resistance, speed, and METs.;Long Term: Improve cardiorespiratory fitness, muscular endurance and strength as measured by increased METs and functional capacity (6MWT)       Able to understand and use rate of perceived exertion (RPE) scale Yes       Intervention Provide education and explanation on how to use RPE scale       Expected Outcomes Short Term: Able to use RPE daily in rehab to express subjective intensity level;Long Term:  Able to use RPE to guide intensity level when exercising independently       Knowledge and understanding of Target Heart Rate Range (THRR) Yes       Intervention Provide education and explanation of THRR including how the numbers were predicted and where they are located for reference       Expected Outcomes Short Term: Able to state/look up THRR;Short Term: Able to use daily as guideline for intensity in rehab;Long Term: Able to use THRR to govern intensity when exercising independently       Able to check pulse independently Yes       Intervention Provide education and demonstration on how to check pulse in carotid and radial arteries.;Review the importance of being able to check your own pulse for safety during independent exercise       Expected Outcomes Short Term: Able to explain why pulse checking is important during independent exercise;Long Term: Able to check pulse independently and accurately       Understanding of Exercise Prescription Yes       Intervention Provide education, explanation, and written materials on patient's individual exercise prescription       Expected Outcomes Short Term: Able to explain program exercise prescription;Long Term: Able to explain  home exercise prescription to exercise independently              Exercise Goals Re-Evaluation:  Exercise Goals Re-Evaluation    Row Name 09/23/19 1004 10/14/19 1026           Exercise Goal Re-Evaluation   Exercise Goals Review Increase Physical Activity;Knowledge and understanding of Target Heart Rate Range (THRR);Able to understand and use rate of perceived exertion (RPE) scale;Increase Strength and Stamina Increase Physical Activity;Increase Strength and Stamina;Able to understand and use rate of perceived exertion (RPE) scale;Knowledge and understanding of Target Heart Rate Range (THRR);Able to check pulse independently;Understanding of Exercise Prescription      Comments Pt's first day of exercise. Pt able to exercise 30 minutes with minimal difficulty. Will continute to monitor. Reviewed HEP with pt. Also discussed THRR, RPE Scale, weather conditions, endpoints of exercise, NTG use, warmup and cool down. Also reviewed METs and progress with pt.      Expected Outcomes Pt will continue to increase cardiovascular fitness and stamina. Pt will plan to add 2 days of walking at home for15 minutes. With a goal of 30 minutes eventually.             Nutrition & Weight - Outcomes:  Pre Biometrics - 09/19/19 1126      Pre Biometrics   Height _0  (1.702 m)    Weight 121.1 kg    Waist Circumference 48 inches    Hip Circumference 54.5 inches    Waist to Hip Ratio 0.88 %    BMI (Calculated) 41.8    Triceps Skinfold 33 mm    % Body Fat 50.9 %    Grip Strength  29 kg    Flexibility 11 in    Single Leg Stand 13.89 seconds            Nutrition:  Nutrition Therapy & Goals - 09/25/19 1041      Nutrition Therapy   Diet Heart Healthy    Drug/Food Interactions Statins/Certain Fruits      Personal Nutrition Goals   Nutrition Goal Improved blood glucose control as evidenced by pt's A1c trending from 8.5 toward less than 7.0.    Personal Goal #2 CBG concentrations in the normal range  or as close to normal as is safely possible.    Personal Goal #3 Pt able to name foods that affect blood glucose      Intervention Plan   Intervention Prescribe, educate and counsel regarding individualized specific dietary modifications aiming towards targeted core components such as weight, hypertension, lipid management, diabetes, heart failure and other comorbidities.    Expected Outcomes Short Term Goal: A plan has been developed with personal nutrition goals set during dietitian appointment.           Nutrition Discharge:  Nutrition Assessments - 10/15/19 1031      MEDFICTS Scores   Pre Score 24           Education Questionnaire Score:  Knowledge Questionnaire Score - 09/19/19 1114      Knowledge Questionnaire Score   Pre Score 19/24           Goals reviewed with patient; copy given to patient.Pt graduated from cardiac rehab program today with completion of 11 exercise sessions in Phase II. Pt maintained good attendance and progressed nicely during his participation in rehab as evidenced by increased MET level.   Medication list reconciled. Patient says she stopped taking her plavix upon review due to no refills. Dr berry's office notified. Repeat  PHQ score- 0 .  Pt has made significant lifestyle changes and should be commended for her success. Pt feels she has achieved her goals during cardiac rehab.   Pt plans to continue exercise by walking.Barnet Pall, RN,BSN 12/06/2019 2:52 PM

## 2019-11-15 NOTE — Telephone Encounter (Signed)
Spoke with pt, she reports her pharmacy told her it was not refillable and when she  stopped by the pharmacy they told her it was there and they were going to get it ready. She does not need anything at this time.

## 2019-11-15 NOTE — Progress Notes (Signed)
Subjective: 59 year old female presents the office today for evaluation of a wound/pre-ulcerative callus on the right foot submetatarsal 1.  She is not seeing any openings.  Denies any drainage or swelling or any pus.  No new concerns today. No acute changes since last appointment, and no other complaints at this time.   Objective: AAO x3, NAD DP/PT pulses palpable bilaterally, CRT less than 3 seconds On the right foot submetatarsal 1 and 5 is a preulcerative callus and there is no underlying ulceration drainage or signs of infection.  No open lesions identified.  There is no area discomfort identified of the foot there is no edema, erythema.  Flexor, extensor tendons are intact.  MMT 5/5.  No pain with calf compression, swelling, warmth, erythema  Assessment: Preulcerative callus right foot  Plan: -All treatment options discussed with the patient including all alternatives, risks, complications.  -Hyperkeratotic lesion sharply debrided x2 without any complications or bleeding.  Continue with the inserts which appear to be fitting well. -Monitoring skin breakdown.  Discussed daily foot inspection.  No follow-ups on file.  Vivi Barrack DPM

## 2019-11-15 NOTE — Telephone Encounter (Signed)
*  STAT* If patient is at the pharmacy, call can be transferred to refill team.   1. Which medications need to be refilled? (please list name of each medication and dose if known)  clopidogrel (PLAVIX) 75 MG tablet  2. Which pharmacy/location (including street and city if local pharmacy) is medication to be sent to? [id number] - [place], [place] - [address]  3. Do they need a 30 day or 90 day supply? 30 day supply   [name] is requesting a nurse call her to discuss this medication because the patient has not taken it in four days. She thought she was no longer supposed to take it since the pharmacy said she was out of refills. [name] is also asking the pt be called when the refill is sent and ready for her to pick up.

## 2019-11-19 DIAGNOSIS — E782 Mixed hyperlipidemia: Secondary | ICD-10-CM | POA: Diagnosis not present

## 2019-11-19 LAB — HEPATIC FUNCTION PANEL
ALT: 18 IU/L (ref 0–32)
AST: 18 IU/L (ref 0–40)
Albumin: 4.4 g/dL (ref 3.8–4.8)
Alkaline Phosphatase: 76 IU/L (ref 44–121)
Bilirubin Total: 0.5 mg/dL (ref 0.0–1.2)
Bilirubin, Direct: 0.19 mg/dL (ref 0.00–0.40)
Total Protein: 7.1 g/dL (ref 6.0–8.5)

## 2019-11-19 LAB — LIPID PANEL
Chol/HDL Ratio: 2.5 ratio (ref 0.0–4.4)
Cholesterol, Total: 125 mg/dL (ref 100–199)
HDL: 50 mg/dL (ref >39)
LDL Chol Calc (NIH): 62 mg/dL (ref 0–99)
Triglycerides: 59 mg/dL (ref 0–149)
VLDL Cholesterol Cal: 13 mg/dL (ref 5–40)

## 2019-11-26 ENCOUNTER — Ambulatory Visit
Admission: RE | Admit: 2019-11-26 | Discharge: 2019-11-26 | Disposition: A | Discharge status: Home / Self Care | Payer: Federal, State, Local not specified - PPO | Source: Ambulatory Visit | Attending: Family Medicine | Admitting: Family Medicine

## 2019-11-26 DIAGNOSIS — R319 Hematuria, unspecified: Secondary | ICD-10-CM | POA: Diagnosis not present

## 2019-11-26 DIAGNOSIS — R300 Dysuria: Secondary | ICD-10-CM | POA: Diagnosis not present

## 2019-11-26 DIAGNOSIS — M47816 Spondylosis without myelopathy or radiculopathy, lumbar region: Secondary | ICD-10-CM | POA: Diagnosis not present

## 2019-11-26 DIAGNOSIS — R310 Gross hematuria: Secondary | ICD-10-CM

## 2019-11-26 DIAGNOSIS — R109 Unspecified abdominal pain: Secondary | ICD-10-CM | POA: Diagnosis not present

## 2019-11-26 MED ORDER — IOPAMIDOL (ISOVUE-300) INJECTION 61%
125.0000 mL | Freq: Once | INTRAVENOUS | Status: AC | PRN
  Administered 2019-11-26: 125 mL via INTRAVENOUS

## 2019-12-04 DIAGNOSIS — Z7984 Long term (current) use of oral hypoglycemic drugs: Secondary | ICD-10-CM | POA: Diagnosis not present

## 2019-12-04 DIAGNOSIS — E1165 Type 2 diabetes mellitus with hyperglycemia: Secondary | ICD-10-CM | POA: Diagnosis not present

## 2019-12-04 DIAGNOSIS — E119 Type 2 diabetes mellitus without complications: Secondary | ICD-10-CM | POA: Diagnosis not present

## 2019-12-04 DIAGNOSIS — Z713 Dietary counseling and surveillance: Secondary | ICD-10-CM | POA: Diagnosis not present

## 2019-12-04 DIAGNOSIS — Z794 Long term (current) use of insulin: Secondary | ICD-10-CM | POA: Diagnosis not present

## 2019-12-09 ENCOUNTER — Ambulatory Visit: Payer: Federal, State, Local not specified - PPO | Attending: Internal Medicine

## 2019-12-09 DIAGNOSIS — Z23 Encounter for immunization: Secondary | ICD-10-CM

## 2019-12-09 NOTE — Progress Notes (Signed)
   Covid-19 Vaccination Clinic  Name:  Monica Daniels    MRN: [id number] DOB: Oct 19, 1960  12/09/2019  Monica Daniels was observed post Covid-19 immunization for 30 minutes based on pre-vaccination screening without incident. She was provided with Vaccine Information Sheet and instruction to access the V-Safe system.   Monica Daniels was instructed to call 911 with any severe reactions post vaccine: [name] Difficulty breathing  . Swelling of face and throat  . A fast heartbeat  . A bad rash all over body  . Dizziness and weakness   Immunizations Administered    Name Date Dose VIS Date Route   Pfizer COVID-19 Vaccine 12/09/2019  1:12 PM 0.3 mL 11/13/2019 Intramuscular   Manufacturer: [employer], [employer]   Lot: [id number]   NDC: [address]-9

## 2020-02-09 ENCOUNTER — Other Ambulatory Visit: Payer: Self-pay | Admitting: Cardiovascular Disease

## 2020-02-09 DIAGNOSIS — E782 Mixed hyperlipidemia: Secondary | ICD-10-CM

## 2020-02-13 ENCOUNTER — Other Ambulatory Visit: Payer: Self-pay | Admitting: Cardiovascular Disease

## 2020-02-13 ENCOUNTER — Ambulatory Visit: Payer: Federal, State, Local not specified - PPO | Admitting: Podiatry

## 2020-02-13 DIAGNOSIS — I739 Peripheral vascular disease, unspecified: Secondary | ICD-10-CM

## 2020-02-17 DIAGNOSIS — H269 Unspecified cataract: Secondary | ICD-10-CM | POA: Diagnosis not present

## 2020-02-20 DIAGNOSIS — R310 Gross hematuria: Secondary | ICD-10-CM | POA: Diagnosis not present

## 2020-02-20 DIAGNOSIS — Z0000 Encounter for general adult medical examination without abnormal findings: Secondary | ICD-10-CM | POA: Diagnosis not present

## 2020-02-20 DIAGNOSIS — E1142 Type 2 diabetes mellitus with diabetic polyneuropathy: Secondary | ICD-10-CM | POA: Diagnosis not present

## 2020-02-20 DIAGNOSIS — Z23 Encounter for immunization: Secondary | ICD-10-CM | POA: Diagnosis not present

## 2020-02-20 DIAGNOSIS — I10 Essential (primary) hypertension: Secondary | ICD-10-CM | POA: Diagnosis not present

## 2020-02-20 DIAGNOSIS — E7800 Pure hypercholesterolemia, unspecified: Secondary | ICD-10-CM | POA: Diagnosis not present

## 2020-02-27 ENCOUNTER — Other Ambulatory Visit: Payer: Self-pay

## 2020-02-27 ENCOUNTER — Ambulatory Visit (HOSPITAL_COMMUNITY)
Admission: RE | Admit: 2020-02-27 | Discharge: 2020-02-27 | Disposition: A | Discharge status: Home / Self Care | Payer: Federal, State, Local not specified - PPO | Source: Ambulatory Visit | Attending: Cardiovascular Disease | Admitting: Cardiovascular Disease

## 2020-02-27 DIAGNOSIS — I739 Peripheral vascular disease, unspecified: Secondary | ICD-10-CM | POA: Insufficient documentation

## 2020-02-28 ENCOUNTER — Encounter: Payer: Self-pay | Admitting: Cardiovascular Disease

## 2020-02-28 ENCOUNTER — Ambulatory Visit: Payer: Federal, State, Local not specified - PPO | Admitting: Cardiovascular Disease

## 2020-02-28 VITALS — BP 104/50 | HR 77 | Ht 66.0 in | Wt 263.0 lb

## 2020-02-28 DIAGNOSIS — E782 Mixed hyperlipidemia: Secondary | ICD-10-CM

## 2020-02-28 DIAGNOSIS — I739 Peripheral vascular disease, unspecified: Secondary | ICD-10-CM | POA: Diagnosis not present

## 2020-02-28 DIAGNOSIS — I10 Essential (primary) hypertension: Secondary | ICD-10-CM | POA: Diagnosis not present

## 2020-02-28 DIAGNOSIS — I25118 Atherosclerotic heart disease of native coronary artery with other forms of angina pectoris: Secondary | ICD-10-CM | POA: Diagnosis not present

## 2020-02-28 NOTE — Assessment & Plan Note (Signed)
History of CAD status post coronary CTA with FFR analysis performed 08/01/2019 revealing two-vessel disease.  I catheterized her 08/12/2019 revealing severe proximal RCA disease as well as proximal and mid LAD disease.  3 days later I performed outpatient RCA stenting, proximal LAD orbital atherectomy, PCI and stenting as well as stenting of her mid LAD.  She has done well since.  She remains on dual antiplatelet therapy.  She denies chest pain or shortness of breath.

## 2020-02-28 NOTE — Assessment & Plan Note (Signed)
History of essential hypertension blood pressure measured today 104/50.  She is on lisinopril.

## 2020-02-28 NOTE — Patient Instructions (Signed)
Medication Instructions:  Your physician recommends that you continue on your current medications as directed. Please refer to the Current Medication list given to you today.  *If you need a refill on your cardiac medications before your next appointment, please call your pharmacy*   Follow-Up: At [facility], you and your health needs are our priority.  As part of our continuing mission to provide you with exceptional heart care, we have created designated Provider Care Teams.  These Care Teams include your primary Cardiologist (physician) and Advanced Practice Providers (APPs -  Physician Assistants and Nurse Practitioners) who all work together to provide you with the care you need, when you need it.  We recommend signing up for the patient portal called "MyChart".  Sign up information is provided on this After Visit Summary.  MyChart is used to connect with patients for Virtual Visits (Telemedicine).  Patients are able to view lab/test results, encounter notes, upcoming appointments, etc.  Non-urgent messages can be sent to your provider as well.   To learn more about what you can do with MyChart, go to [email or web address].    Your next appointment:   3 month(s)  The format for your next appointment:   In Person  Provider:   Jonathan Berry, MD 

## 2020-02-28 NOTE — Assessment & Plan Note (Signed)
History of PAD with right calf claudication and Dopplers that were performed yesterday revealing a right ABI of 0.48 and a left of 0.84 with a high-frequency signal in the mid right SFA.  She also has tibial vessel disease.  She wishes to have this addressed percutaneously in 3 months.

## 2020-02-28 NOTE — Assessment & Plan Note (Addendum)
History of hyperlipidemia on statin therapy and Zetia with lipid profile performed 02/20/2020 revealing total cholesterol 112, LDL 53 and HDL of 44.

## 2020-02-28 NOTE — Progress Notes (Signed)
02/28/2020 Monica Daniels   [birth year]  [id number]  Primary Physician Shon Hale, MD Primary Cardiologist: Runell Gess MD Nicholes Calam[place], [place]  HPI:  Monica Daniels is a 60 y.o.  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 She is referred by her podiatrist, Dr. Ernestene Kiel, for peripheral vascular valuation because of an ulcer on her right first metatarsal head.I last saw her in the office  08/27/2019. Her cardiac risk factors are notable for treated hypertension, diabetes and hyperlipidemia. Unfortunately her hemoglobin A1c's have remained above 8. She does have a family history for heart disease in his mother who had CABG. Her mother, Monica Daniels, was a patient of mine as well who died in 04-May-2018. She is never had a heart attack or stroke. She denies chest pain or shortness of breath,but does sometimes have burning in her chest which she is attributed to reflux. She does not exercise much but denies claudication as well.  Her wound on her right great toe has since healed. Her Doppler studies performed 06/06/2019 revealed a right ABI 0.75 and a left of 0.82 with a high-frequency signal in her distal right SFA as well as tibial vessel disease. She does complain now of right calf claudication. Because of her complaints of chest pain I performed a coronary CTA with FFR analysis 08/01/2019 suggesting at least two-vessel disease. Based on this I recommend that we proceed with outpatient diagnostic coronary angiography. I have asked her to begin taking a baby as  Outpatient cardiac cath on 08/12/2019 revealing severe proximal RCA, proximal mid LAD diseasepirin..  3 days later after performed outpatient RCA stenting, proximal LAD orbital atherectomy, PCI and stenting as well as stenting of her mid LAD.  She is done well since then feels clinically improved.  She denies chest pain or  shortness of breath.  Since I saw her 6 months ago she continues to do well.  She was participating cardiac rehab.  She has noticed lifestyle limiting right calf claudication.  Recent Doppler studies performed 02/27/2020 revealed a right ABI of 0.48 and a left of 0.84 with a high-frequency signal in her mid right SFA.   Current Meds  Medication Sig  . aspirin 81 MG EC tablet TAKE 1 TABLET BY MOUTH DAILY. SWALLOW WHOLE.  [name] atorvastatin (LIPITOR) 10 MG tablet Take 10 mg by mouth daily.  [name] atorvastatin (LIPITOR) 80 MG tablet TAKE 1 TABLET BY MOUTH EVERY DAY  . B-D UF III MINI PEN NEEDLES 31G X 5 MM MISC USE TO INJECT LANTUS DAILY AS DIRECTED  . clopidogrel (PLAVIX) 75 MG tablet Take 1 tablet (75 mg total) by mouth daily.  . cyanocobalamin 1000 MCG tablet Take 1,000 mcg by mouth daily.   . Dulaglutide (TRULICITY) 0.75 MG/0.5ML SOPN Inject 0.75 mg into the skin once a week.  [name] FARXIGA 10 MG TABS tablet Take 10 mg by mouth daily.  . insulin glargine (LANTUS) 100 UNIT/ML injection Inject 13 Units into the skin at bedtime.   [name] latanoprost (XALATAN) 0.005 % ophthalmic solution Place 1 drop into both eyes at bedtime.   [name] lisinopril (ZESTRIL) 10 MG tablet Take 10 mg by mouth daily.  [name] loratadine (CLARITIN) 10 MG tablet Take 10 mg by mouth daily.  . metFORMIN (GLUCOPHAGE-XR) 500 MG 24 hr tablet Take 1,000 mg by mouth 2 (two) times daily.  . Multiple Vitamin (MULTIVITAMIN WITH MINERALS) TABS  tablet Take 1 tablet by mouth daily.  . nitroGLYCERIN (NITROSTAT) 0.4 MG SL tablet Place 1 tablet (0.4 mg total) under the tongue every 5 (five) minutes as needed for chest pain.  Letta Pate VERIO test strip 3 (three) times daily.     Allergies  Allergen Reactions  . Penicillins     Swelling Has patient had a PCN reaction causing immediate rash, facial/tongue/throat swelling, SOB or lightheadedness with hypotension: YES Has patient had a PCN reaction causing severe rash involving mucus membranes or skin necrosis:  NO Has patient had a PCN reaction that required hospitalization NO Has patient had a PCN reaction occurring within the last 10 years: NO If all of the above answers are "NO", then may proceed with Cephalosporin use.  . Clindamycin/Lincomycin Rash    Social History   Socioeconomic History  . Marital status: Single    Spouse name: Not on file  . Number of children: 0  . Years of education: Not on file  . Highest education level: Not on file  Occupational History  . Occupation: Retired  Tobacco Use  . Smoking status: Never Smoker  . Smokeless tobacco: Never Used  Substance and Sexual Activity  . Alcohol use: No  . Drug use: No  . Sexual activity: Not on file  Other Topics Concern  . Not on file  Social History Narrative  . Not on file   Social Determinants of Health   Financial Resource Strain: Not on file  Food Insecurity: Not on file  Transportation Needs: Not on file  Physical Activity: Not on file  Stress: Not on file  Social Connections: Not on file  Intimate Partner Violence: Not on file     Review of Systems: General: negative for chills, fever, night sweats or weight changes.  Cardiovascular: negative for chest pain, dyspnea on exertion, edema, orthopnea, palpitations, paroxysmal nocturnal dyspnea or shortness of breath Dermatological: negative for rash Respiratory: negative for cough or wheezing Urologic: negative for hematuria Abdominal: negative for nausea, vomiting, diarrhea, bright red blood per rectum, melena, or hematemesis Neurologic: negative for visual changes, syncope, or dizziness All other systems reviewed and are otherwise negative except as noted above.    Blood pressure (!) 104/50, pulse 77, height 5\' 6"  (1.676 m), weight 263 lb (119.3 kg), last menstrual period 12/31/2012.  General appearance: alert and no distress Neck: no adenopathy, no JVD, supple, symmetrical, trachea midline, thyroid not enlarged, symmetric, no tenderness/mass/nodules  and Soft left carotid bruit Lungs: clear to auscultation bilaterally Heart: regular rate and rhythm, S1, S2 normal, no murmur, click, rub or gallop Extremities: extremities normal, atraumatic, no cyanosis or edema Pulses: 2+ and symmetric Skin: Skin color, texture, turgor normal. No rashes or lesions Neurologic: Alert and oriented X 3, normal strength and tone. Normal symmetric reflexes. Normal coordination and gait  EKG sinus rhythm at 77 with inferior T wave inversion and reverse R wave progression.  I personally reviewed this EKG.  ASSESSMENT AND PLAN:   HLD (hyperlipidemia) History of hyperlipidemia on statin therapy and Zetia with lipid profile performed 02/20/2020 revealing total cholesterol 112, LDL 53 and HDL of 44.  Essential hypertension History of essential hypertension blood pressure measured today 104/50.  She is on lisinopril.  Peripheral arterial disease (HCC) History of PAD with right calf claudication and Dopplers that were performed yesterday revealing a right ABI of 0.48 and a left of 0.84 with a high-frequency signal in the mid right SFA.  She also has tibial vessel disease.  She  wishes to have this addressed percutaneously in 3 months.  Coronary artery disease History of CAD status post coronary CTA with FFR analysis performed 08/01/2019 revealing two-vessel disease.  I catheterized her 08/12/2019 revealing severe proximal RCA disease as well as proximal and mid LAD disease.  3 days later I performed outpatient RCA stenting, proximal LAD orbital atherectomy, PCI and stenting as well as stenting of her mid LAD.  She has done well since.  She remains on dual antiplatelet therapy.  She denies chest pain or shortness of breath.      Runell Gess MD FACP,FACC,FAHA, FSCAI 02/28/2020 12:00 PM

## 2020-03-05 ENCOUNTER — Ambulatory Visit (INDEPENDENT_AMBULATORY_CARE_PROVIDER_SITE_OTHER): Payer: Federal, State, Local not specified - PPO | Admitting: Podiatry

## 2020-03-05 ENCOUNTER — Other Ambulatory Visit: Payer: Self-pay

## 2020-03-05 DIAGNOSIS — I739 Peripheral vascular disease, unspecified: Secondary | ICD-10-CM

## 2020-03-05 DIAGNOSIS — L84 Corns and callosities: Secondary | ICD-10-CM

## 2020-03-05 DIAGNOSIS — E1151 Type 2 diabetes mellitus with diabetic peripheral angiopathy without gangrene: Secondary | ICD-10-CM | POA: Diagnosis not present

## 2020-03-05 DIAGNOSIS — G4733 Obstructive sleep apnea (adult) (pediatric): Secondary | ICD-10-CM | POA: Diagnosis not present

## 2020-03-07 NOTE — Progress Notes (Signed)
Subjective: 60 year old female presents the office today for diabetic foot evaluation.  She states the area submetatarsal 1 is been doing well.  She does get "pressure points" submetatarsal 5.  Also she did have a wound on the second toe from other big toe was putting pressure but this did heal.  She is to continue follow-up with Dr. Gery Pray as well for circulation.  Objective: AAO x3, NAD DP/PT pulses palpable bilaterally, CRT less than 3 seconds Hyperkeratotic lesion bilateral submetatarsal 5 without any underlying ulceration drainage or any signs of infection.  There is no open lesion identified today.  Particularly on the arm where she had a wound since we last saw her on the second toe this is healed without any skin breakdown. No pain with calf compression, swelling, warmth, erythema  Assessment: Preulcerative callus right foot  Plan: -All treatment options discussed with the patient including all alternatives, risks, complications.  -Hyperkeratotic lesion sharply debrided x2 without any complications or bleeding.  Continue with the inserts which appear to be fitting well. -Dispensed toe separator for offloading. -Monitoring skin breakdown.  Discussed daily foot inspection.  Monica Daniels DPM

## 2020-03-10 ENCOUNTER — Encounter: Payer: Self-pay | Admitting: Physician Assistant

## 2020-03-13 ENCOUNTER — Encounter: Payer: Self-pay | Admitting: Physician Assistant

## 2020-03-18 DIAGNOSIS — E1136 Type 2 diabetes mellitus with diabetic cataract: Secondary | ICD-10-CM | POA: Diagnosis not present

## 2020-03-18 DIAGNOSIS — Z01818 Encounter for other preprocedural examination: Secondary | ICD-10-CM | POA: Diagnosis not present

## 2020-03-18 DIAGNOSIS — H25013 Cortical age-related cataract, bilateral: Secondary | ICD-10-CM | POA: Diagnosis not present

## 2020-03-18 DIAGNOSIS — Z794 Long term (current) use of insulin: Secondary | ICD-10-CM | POA: Diagnosis not present

## 2020-03-18 DIAGNOSIS — E1142 Type 2 diabetes mellitus with diabetic polyneuropathy: Secondary | ICD-10-CM | POA: Insufficient documentation

## 2020-03-18 DIAGNOSIS — H268 Other specified cataract: Secondary | ICD-10-CM | POA: Diagnosis not present

## 2020-03-26 DIAGNOSIS — H25811 Combined forms of age-related cataract, right eye: Secondary | ICD-10-CM | POA: Diagnosis not present

## 2020-04-01 ENCOUNTER — Encounter: Payer: Self-pay | Admitting: Physician Assistant

## 2020-04-01 ENCOUNTER — Ambulatory Visit: Payer: Federal, State, Local not specified - PPO | Admitting: Physician Assistant

## 2020-04-01 VITALS — BP 124/70 | HR 80 | Ht 66.0 in | Wt 262.0 lb

## 2020-04-01 DIAGNOSIS — R1084 Generalized abdominal pain: Secondary | ICD-10-CM | POA: Diagnosis not present

## 2020-04-01 DIAGNOSIS — K625 Hemorrhage of anus and rectum: Secondary | ICD-10-CM | POA: Diagnosis not present

## 2020-04-01 DIAGNOSIS — R194 Change in bowel habit: Secondary | ICD-10-CM

## 2020-04-01 DIAGNOSIS — R140 Abdominal distension (gaseous): Secondary | ICD-10-CM

## 2020-04-01 MED ORDER — HYOSCYAMINE SULFATE 0.125 MG SL SUBL: 0.1250 mg | SUBLINGUAL_TABLET | Freq: Four times a day (QID) | SUBLINGUAL | 3 refills | Status: DC | PRN

## 2020-04-01 NOTE — Progress Notes (Signed)
Chief Complaint: Abdominal pain, bloating, change in bowel habits  HPI:    Monica Daniels is a 60 year old African-American female with a past medical history of diabetes and others listed below, who was referred to me by Shon Hale, * for a complaint of abdominal pain, bloating and change in bowel habits.      11/26/2019 CT abdomen pelvis without contrast with no urolithiasis, diffuse hepatic steatosis, moderate colonic stool volume suggesting constipation, coronary atherosclerosis and aortic atherosclerosis.    02/20/2020 patient seen by PCP for well visit.  At that time describes no abdominal pain, there is some question about relation to gastroparesis versus constipation versus food intolerance.  Labs at that time showed a normal CMP and CBC.    Today, the patient tells me that her last colonoscopy was at [facility] in 2018, she had a finding of polyps and was recommended to repeat in 5 years.  Tells me that for the past year she has had occasional issues with "severe" abdominal cramps, so severe that one time she had to pull off the road and just sit back and wait till the past, typically takes about 30 minutes or so before it goes away.  Over the past 6 months or so these episodes have been coming more frequently.  Tells me that she will often get a cramp at least a couple times a month and feels some pressure mostly in the top part of her abdomen.  Tells me when this occurred back in September of last year she then had a following diarrheal bowel movement with a lot of bright red blood, but has not seen any since then.  Incidentally was also found to have a UTI around that timeframe for which she was treated.  Discusses a CT that she had which showed constipation, tells me this is a problem off and on, but not consistently.  Does remain somewhat bloated.  Patient has altered her diet avoiding red meats and multiple other foods to try and help with symptoms and it does help  slightly.    Denies fever, chills, weight loss, heartburn, reflux, further blood in her stool or symptoms that awaken her from sleep.  Past Medical History:  Diagnosis Date  . Diabetes mellitus without complication (HCC)   . Hypertension     Past Surgical History:  Procedure Laterality Date  . CARDIAC CATHETERIZATION    . CORONARY ATHERECTOMY N/A 08/15/2019   Procedure: CORONARY ATHERECTOMY;  Surgeon: Runell Gess, MD;  [facility]VASIVE CV LAB;  Service: Cardiovascular;  Laterality: N/A;  . CORONARY STENT INTERVENTION N/A 08/15/2019   Procedure: CORONARY STENT INTERVENTION;  Surgeon: Runell Gess, MD;  Location: [facility] INVASIVE CV LAB;  Service: Cardiovascular;  Laterality: N/A;  . HAND TENDON SURGERY    . LEFT HEART CATH AND CORONARY ANGIOGRAPHY N/A 08/12/2019   Procedure: LEFT HEART CATH AND CORONARY ANGIOGRAPHY;  Surgeon: Runell Gess, MD;  Location: [facility] INVASIVE CV LAB;  Service: Cardiovascular;  Laterality: N/A;    Current Outpatient Medications  Medication Sig Dispense Refill  . aspirin 81 MG EC tablet TAKE 1 TABLET BY MOUTH DAILY. SWALLOW WHOLE. 30 tablet 12  . atorvastatin (LIPITOR) 80 MG tablet TAKE 1 TABLET BY MOUTH EVERY DAY 90 tablet 1  . B-D UF III MINI PEN NEEDLES 31G X 5 MM MISC USE TO INJECT LANTUS DAILY AS DIRECTED    . clopidogrel (PLAVIX) 75 MG tablet Take 1 tablet (75 mg total) by mouth daily.  30 tablet 11  . cyanocobalamin 1000 MCG tablet Take 1,000 mcg by mouth daily.     . Dulaglutide (TRULICITY) 1.5 MG/0.5ML SOPN Inject 1.5 mg into the skin once a week.    . ezetimibe (ZETIA) 10 MG tablet Take 1 tablet (10 mg total) by mouth daily. 90 tablet 3  . FARXIGA 10 MG TABS tablet Take 10 mg by mouth daily.    . insulin glargine (LANTUS) 100 UNIT/ML injection Inject 8 Units into the skin at bedtime.    [name] ketorolac (ACULAR) 0.5 % ophthalmic solution Place 1 drop into the right eye 4 (four) times daily.    [name] latanoprost (XALATAN) 0.005 % ophthalmic solution  Place 1 drop into both eyes at bedtime.     [name] lisinopril (ZESTRIL) 10 MG tablet Take 10 mg by mouth daily.    [name] loratadine (CLARITIN) 10 MG tablet Take 10 mg by mouth daily.    . metFORMIN (GLUCOPHAGE-XR) 500 MG 24 hr tablet Take 1,000 mg by mouth 2 (two) times daily.    . Multiple Vitamin (MULTIVITAMIN WITH MINERALS) TABS tablet Take 1 tablet by mouth daily.    . nitroGLYCERIN (NITROSTAT) 0.4 MG SL tablet Place 1 tablet (0.4 mg total) under the tongue every 5 (five) minutes as needed for chest pain. 25 tablet 2  . ONETOUCH VERIO test strip 3 (three) times daily.     No current facility-administered medications for this visit.    Allergies as of 04/01/2020 - Review Complete 04/01/2020  Allergen Reaction Noted  . Penicillins  02/21/2014  . Clindamycin/lincomycin Rash 02/22/2014    Family History  Problem Relation Age of Onset  . Colon cancer Neg Hx   . Stomach cancer Neg Hx   . Pancreatic cancer Neg Hx   . Esophageal cancer Neg Hx   . Liver disease Neg Hx     Social History   Socioeconomic History  . Marital status: Single    Spouse name: Not on file  . Number of children: 0  . Years of education: Not on file  . Highest education level: Not on file  Occupational History  . Occupation: Retired  Tobacco Use  . Smoking status: Never Smoker  . Smokeless tobacco: Never Used  Vaping Use  . Vaping Use: Never used  Substance and Sexual Activity  . Alcohol use: No  . Drug use: No  . Sexual activity: Not Currently  Other Topics Concern  . Not on file  Social History Narrative  . Not on file   Social Determinants of Health   Financial Resource Strain: Not on file  Food Insecurity: Not on file  Transportation Needs: Not on file  Physical Activity: Not on file  Stress: Not on file  Social Connections: Not on file  Intimate Partner Violence: Not on file    Review of Systems:    Constitutional: No weight loss, fever or chills Skin: No rash  Cardiovascular: No chest  pain  Respiratory: No SOB Gastrointestinal: See HPI and otherwise negative Genitourinary: No dysuria  Neurological: No headache, dizziness or syncope Musculoskeletal: No new muscle or joint pain Hematologic: No bruising Psychiatric: No history of depression or anxiety   Physical Exam:  Vital signs: BP 124/70   Pulse 80   Ht 5\' 6"  (1.676 m)   Wt 262 lb (118.8 kg)   LMP 12/31/2012   SpO2 99%   BMI 42.29 kg/m   Constitutional:   Very pleasant obese AA female appears to be in NAD, Well developed,  Well nourished, alert and cooperative Head:  Normocephalic and atraumatic. Eyes:   PEERL, EOMI. No icterus. Conjunctiva pink. Ears:  Normal auditory acuity. Neck:  Supple Throat: Oral cavity and pharynx without inflammation, swelling or lesion.  Respiratory: Respirations even and unlabored. Lungs clear to auscultation bilaterally.   No wheezes, crackles, or rhonchi.  Cardiovascular: Normal S1, S2. No MRG. Regular rate and rhythm. No peripheral edema, cyanosis or pallor.  Gastrointestinal:  Soft, nondistended, nontender. No rebound or guarding. Normal bowel sounds. No appreciable masses or hepatomegaly. Rectal:  Not performed.  Msk:  Symmetrical without gross deformities. Without edema, no deformity or joint abnormality.  Neurologic:  Alert and  oriented x4;  grossly normal neurologically.  Skin:   Dry and intact without significant lesions or rashes. Psychiatric:  Demonstrates good judgement and reason without abnormal affect or behaviors.  See HPI for recent labs and imaging.  Assessment: 1.  Generalized abdominal cramping: Occasional episodes of cramping, sometimes so severe that she has to stop what she is doing, uncertain what sets them off; consider IBS versus other 2.  Bloating: Consider relation to diet+/-IBS +/-SIBO  3.  Rectal bleeding: 1 episode back in September of 2 days of bleeding which "filled the bowl", none since then, was associated with some diarrhea around that time,  up-to-date on colon cancer screening with her last colonoscopy in 2018; most likely hemorrhoids 4.  Change in bowel habits: Varying between constipation and diarrhea with symptoms as above; consider IBS versus other  Plan: 1.  Discussed with patient that we could further evaluate with EGD and colonoscopy but patient would like to hold off on this and try conservative measures first. 2.  We will try to get records from patient/colonoscopy at [facility]. 3.  Would recommend the patient start Align probiotic once daily for the next 2 months 4.  Gave the patient information regarding the low FODMAP diet.  Discussed that she should follow the strictly for the next 6 weeks to try and eliminate foods which may be causing a problem. 5.  Prescribed Hyoscyamine sulfate 0.125 mg sublingual tabs every 4-6 hours as needed for abdominal cramping #30 with 3 refills. 6.  Discussed with patient that if symptoms increase or worsen then she needs to call and let us know, at that time may recommend procedures more urgently, she would need to hold her Plavix for 5 days. 7.  Patient to follow in clinic with me in 2 months or sooner if necessary.  She was assigned to Dr. Christella Hartigan this morning.  Hyacinth Meeker, PA-C [facility] 04/01/2020, 10:27 AM  Cc: Shon Hale, *

## 2020-04-01 NOTE — Progress Notes (Signed)
I agree with the above note, plan 

## 2020-04-01 NOTE — Patient Instructions (Signed)
If you are age 62 or older, your body mass index should be between 23-30. Your Body mass index is 42.29 kg/m. If this is out of the aforementioned range listed, please consider follow up with your Primary Care Provider.  If you are age 62 or younger, your body mass index should be between 19-25. Your Body mass index is 42.29 kg/m. If this is out of the aformentioned range listed, please consider follow up with your Primary Care Provider.   We have sent the following medications to your pharmacy for you to pick up at your convenience: Hyoscyamine 0.125 mg   Start align over the counter for two months.  Please follow low-fodmap diet given to you.  Thank you for choosing me and [facility].  [name]

## 2020-04-14 ENCOUNTER — Telehealth: Payer: Self-pay | Admitting: Cardiovascular Disease

## 2020-04-14 NOTE — Telephone Encounter (Signed)
   Primary Cardiologist: [name], MD  Chart reviewed as part of pre-operative protocol coverage. Simple dental extractions are considered low risk procedures per guidelines and generally do not require any specific cardiac clearance. It is also generally accepted that for simple extractions and dental cleanings, there is no need to interrupt blood thinner therapy.   SBE prophylaxis is not required for the patient.  I will route this recommendation to the requesting party via Epic fax function and remove from pre-op pool.  Please call with questions.  Ronney Asters, NP 04/14/2020, 11:38 AM

## 2020-04-14 NOTE — Telephone Encounter (Signed)
[facility] called and wanted to know if this pt would need pre meds before a cleaning.    Best number [phone] Fax [phone] Lowella Bandy

## 2020-04-23 DIAGNOSIS — H25812 Combined forms of age-related cataract, left eye: Secondary | ICD-10-CM | POA: Diagnosis not present

## 2020-04-23 DIAGNOSIS — H269 Unspecified cataract: Secondary | ICD-10-CM | POA: Diagnosis not present

## 2020-04-29 DIAGNOSIS — H40033 Anatomical narrow angle, bilateral: Secondary | ICD-10-CM | POA: Diagnosis not present

## 2020-04-29 DIAGNOSIS — Z794 Long term (current) use of insulin: Secondary | ICD-10-CM | POA: Diagnosis not present

## 2020-04-29 DIAGNOSIS — H269 Unspecified cataract: Secondary | ICD-10-CM | POA: Diagnosis not present

## 2020-04-29 DIAGNOSIS — E1142 Type 2 diabetes mellitus with diabetic polyneuropathy: Secondary | ICD-10-CM | POA: Diagnosis not present

## 2020-04-29 DIAGNOSIS — Z79899 Other long term (current) drug therapy: Secondary | ICD-10-CM | POA: Diagnosis not present

## 2020-04-29 DIAGNOSIS — E119 Type 2 diabetes mellitus without complications: Secondary | ICD-10-CM | POA: Diagnosis not present

## 2020-04-29 DIAGNOSIS — H5319 Other subjective visual disturbances: Secondary | ICD-10-CM | POA: Diagnosis not present

## 2020-04-29 DIAGNOSIS — Z48810 Encounter for surgical aftercare following surgery on the sense organs: Secondary | ICD-10-CM | POA: Diagnosis not present

## 2020-04-29 DIAGNOSIS — Z6841 Body Mass Index (BMI) 40.0 and over, adult: Secondary | ICD-10-CM | POA: Diagnosis not present

## 2020-04-29 DIAGNOSIS — E113299 Type 2 diabetes mellitus with mild nonproliferative diabetic retinopathy without macular edema, unspecified eye: Secondary | ICD-10-CM | POA: Diagnosis not present

## 2020-04-29 DIAGNOSIS — Z961 Presence of intraocular lens: Secondary | ICD-10-CM | POA: Diagnosis not present

## 2020-04-29 DIAGNOSIS — Z9889 Other specified postprocedural states: Secondary | ICD-10-CM | POA: Diagnosis not present

## 2020-05-08 ENCOUNTER — Ambulatory Visit: Payer: Federal, State, Local not specified - PPO | Attending: Internal Medicine

## 2020-05-08 ENCOUNTER — Other Ambulatory Visit: Payer: Self-pay

## 2020-05-08 DIAGNOSIS — Z23 Encounter for immunization: Secondary | ICD-10-CM

## 2020-05-08 MED ORDER — PFIZER-BIONT COVID-19 VAC-TRIS 30 MCG/0.3ML IM SUSP
INTRAMUSCULAR | 0 refills | Status: DC
  Filled 2020-05-08: qty 0.3, 1d supply, fill #0

## 2020-05-08 NOTE — Progress Notes (Signed)
   Covid-19 Vaccination Clinic  Name:  Monica Daniels    MRN: [id number] DOB: 09/25/1960  05/08/2020  [name] was observed post Covid-19 immunization for 15 minutes without incident. She was provided with Vaccine Information Sheet and instruction to access the V-Safe system.   [name] was instructed to call 911 with any severe reactions post vaccine: [name] Difficulty breathing  . Swelling of face and throat  . A fast heartbeat  . A bad rash all over body  . Dizziness and weakness   Immunizations Administered    Name Date Dose VIS Date Route   PFIZER Comrnaty(Gray TOP) Covid-19 Vaccine 05/08/2020 12:53 PM 0.3 mL 01/02/2020 Intramuscular   Manufacturer: [employer], [employer]   Lot: [id number]   NDC: [phone]

## 2020-05-27 ENCOUNTER — Other Ambulatory Visit: Payer: Self-pay

## 2020-05-27 ENCOUNTER — Encounter: Payer: Self-pay | Admitting: Cardiovascular Disease

## 2020-05-27 ENCOUNTER — Ambulatory Visit: Payer: Federal, State, Local not specified - PPO | Admitting: Cardiovascular Disease

## 2020-05-27 VITALS — BP 114/54 | HR 84 | Ht 66.0 in | Wt 262.0 lb

## 2020-05-27 DIAGNOSIS — Z01812 Encounter for preprocedural laboratory examination: Secondary | ICD-10-CM

## 2020-05-27 DIAGNOSIS — I739 Peripheral vascular disease, unspecified: Secondary | ICD-10-CM

## 2020-05-27 NOTE — Assessment & Plan Note (Signed)
Monica Daniels returns today for follow-up of her PAD.  She does still is experiencing lifestyle limiting right calf claudication.  She had Doppler studies performed 02/27/2020 revealing right ABI of 0.48 and a left of 0.84.  She did have a high-frequency signal in her mid right SFA with occluded posterior tibial artery.  She wishes to proceed with angiography and endovascular therapy.

## 2020-05-27 NOTE — Patient Instructions (Signed)
Medication Instructions:  No Changes In Medications at this time.  *If you need a refill on your cardiac medications before your next appointment, please call your pharmacy*  Lab Work: BMET and CBC- TODAY  If you have labs (blood work) drawn today and your tests are completely normal, you will receive your results only by: [name] MyChart Message (if you have MyChart) OR . A paper copy in the mail If you have any lab test that is abnormal or we need to change your treatment, we will call you to review the results.  Testing/Procedures: Your physician has requested that you have a lower extremity arterial duplex ONE WEEK AFTER PROCEDURE. During this test, ultrasound is used to evaluate arterial blood flow in the legs. Allow one hour for this exam. There are no restrictions or special instructions. This will take place at [address] [facility], [address].  Your physician has requested that you have an ankle brachial index (ABI) ONE WEEK AFTER PROCEDURE. During this test an ultrasound and blood pressure cuff are used to evaluate the arteries that supply the arms and legs with blood. Allow thirty minutes for this exam. There are no restrictions or special instructions. This will take place at [address] [facility], [address].     MEDICAL GROUP [facility] CARDIOVASCULAR DIVISION [facility] [address] [place] [address] Dept: [phone] Loc: [phone]  Monica Daniels  05/27/2020  You are scheduled for a Peripheral Angiogram on Monday, May 9 with Dr. [name].  1. Please arrive at the [facility] (Main Entrance A) at [facility]: [address] [place], [place] [address] at 5:30 AM (This time is two hours before your procedure to ensure your preparation). Free valet parking service is available.   Special note: Every effort is made to have your procedure done on time. Please understand that emergencies sometimes delay scheduled procedures.  2.  Diet: Do not eat solid foods after midnight.  The patient may have clear liquids until 5am upon the day of the procedure.  3. Labs: You will need to have blood drawn on Wednesday, May 4 at [facility] [address], [place]  Open: 8am - 5pm (Lunch 12:30 - 1:30)   Phone: [phone]. You do not need to be fasting.  You will need a COVID-19  test prior to your procedure. You are scheduled for  12:25 at PM. This is a Drive Up Visit at [address] [place], [place] [address]. Someone will direct you to the appropriate testing line. Stay in your car and someone will be with you shortly.  4. Medication instructions in preparation for your procedure:   Contrast Allergy: No  Take only 4 units of insulin the night before your procedure. Do not take any insulin on the day of the procedure.   DO NOT TAKE FARXIGA THE MORNING OF PROCEDURE  Do not take Diabetes Med Glucophage (Metformin) on the day of the procedure and HOLD 48 HOURS AFTER THE PROCEDURE.  On the morning of your procedure, take your Aspirin and PLAVIX and any morning medicines NOT listed above.  You may use sips of water.  5. Plan for one night stay--bring personal belongings. 6. Bring a current list of your medications and current insurance cards. 7. You MUST have a responsible person to drive you home. 8. Someone MUST be with you the first 24 hours after you arrive home or your discharge will be delayed. 9. Please wear clothes that are  easy to get on and off and wear slip-on shoes.  Thank you for allowing [place] to care for you!   -- [facility]  Follow-Up: At [facility], you and your health needs are our priority.  As part of our continuing mission to provide you with exceptional heart care, we have created designated Provider Care Teams.  These Care Teams include your primary Cardiologist (physician) and Advanced Practice Providers (APPs -  Physician Assistants and Nurse  Practitioners) who all work together to provide you with the care you need, when you need it.  Your next appointment:   2 WEEKS AFTER PROCEDURE   The format for your next appointment:   In Person  Provider:   [name], MD

## 2020-05-27 NOTE — Progress Notes (Signed)
05/27/2020 Monica Daniels   [birth year]  [id number]  Primary Physician Shon Hale, MD Primary Cardiologist: Runell Gess MD Nicholes Calamity, [place]  HPI:  Monica Daniels is a 60 y.o.  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 She is referred by her podiatrist, Dr. Ernestene Kiel, for peripheral vascular valuation because of an ulcer on her right first metatarsal head.I last saw her in the office  02/28/2020. Her cardiac risk factors are notable for treated hypertension, diabetes and hyperlipidemia. Unfortunately her hemoglobin A1c's have remained above 8. She does have a family history for heart disease in his mother who had CABG. Her mother, Monica Daniels, was a patient of mine as well who died in 22-Apr-2018. She is never had a heart attack or stroke. She denies chest pain or shortness of breath,but does sometimes have burning in her chest which she is attributed to reflux. She does not exercise much but denies claudication as well.  Her wound on her right great toe has since healed. Her Doppler studies performed 06/06/2019 revealed a right ABI 0.75 and a left of 0.82 with a high-frequency signal in her distal right SFA as well as tibial vessel disease. She does complain now of right calf claudication. Because of her complaints of chest pain I performed a coronary CTA with FFR analysis 08/01/2019 suggesting at least two-vessel disease. Based on this I recommend that we proceed with outpatient diagnostic coronary angiography. I have asked her to begin taking a baby as  Outpatient cardiac cath on 08/12/2019 revealing severe proximal RCA, proximal mid LAD diseasepirin.. 3 days later after performed outpatient RCA stenting, proximal LAD orbital atherectomy, PCI and stenting as well as stenting of her mid LAD. She is done well since then feels clinically improved. She denies chest pain or  shortness of breath.  She was participating cardiac rehab.  She has noticed lifestyle limiting right calf claudication.  Recent Doppler studies performed 02/27/2020 revealed a right ABI of 0.48 and a left of 0.84 with a high-frequency signal in her mid right SFA.  Since I saw her 3 months ago she continues to experience right calf claudication.  She wishes to proceed with peripheral angiography and endovascular therapy for lifestyle limiting claudication.    Current Meds  Medication Sig  . aspirin 81 MG EC tablet TAKE 1 TABLET BY MOUTH DAILY. SWALLOW WHOLE.  [name] atorvastatin (LIPITOR) 80 MG tablet TAKE 1 TABLET BY MOUTH EVERY DAY  . B-D UF III MINI PEN NEEDLES 31G X 5 MM MISC USE TO INJECT LANTUS DAILY AS DIRECTED  . clopidogrel (PLAVIX) 75 MG tablet Take 1 tablet (75 mg total) by mouth daily.  [name] COVID-19 mRNA Vac-TriS, Pfizer, (PFIZER-BIONT COVID-19 VAC-TRIS) SUSP injection Inject into the muscle.  . cyanocobalamin 1000 MCG tablet Take 1,000 mcg by mouth daily.   . Dulaglutide (TRULICITY) 1.5 MG/0.5ML SOPN Inject 1.5 mg into the skin once a week.  . ezetimibe (ZETIA) 10 MG tablet Take 1 tablet (10 mg total) by mouth daily.  [name] FARXIGA 10 MG TABS tablet Take 10 mg by mouth daily.  . hyoscyamine (LEVSIN SL) 0.125 MG SL tablet Place 1 tablet (0.125 mg total) under the tongue every 6 (six) hours as needed. Take one tablet every 4-6 hours as needed for abdominal cramping  . insulin glargine (LANTUS) 100 UNIT/ML injection Inject 8 Units into the skin at bedtime.  [name]  ketorolac (ACULAR) 0.5 % ophthalmic solution Place 1 drop into the right eye 4 (four) times daily.  [name] latanoprost (XALATAN) 0.005 % ophthalmic solution Place 1 drop into both eyes at bedtime.   [name] lisinopril (ZESTRIL) 10 MG tablet Take 10 mg by mouth daily.  [name] loratadine (CLARITIN) 10 MG tablet Take 10 mg by mouth daily.  . metFORMIN (GLUCOPHAGE-XR) 500 MG 24 hr tablet Take 1,000 mg by mouth 2 (two) times daily.  . Multiple Vitamin  (MULTIVITAMIN WITH MINERALS) TABS tablet Take 1 tablet by mouth daily.  . nitroGLYCERIN (NITROSTAT) 0.4 MG SL tablet Place 1 tablet (0.4 mg total) under the tongue every 5 (five) minutes as needed for chest pain.  Letta Pate VERIO test strip 3 (three) times daily.     Allergies  Allergen Reactions  . Penicillins     Swelling Has patient had a PCN reaction causing immediate rash, facial/tongue/throat swelling, SOB or lightheadedness with hypotension: YES Has patient had a PCN reaction causing severe rash involving mucus membranes or skin necrosis: NO Has patient had a PCN reaction that required hospitalization NO Has patient had a PCN reaction occurring within the last 10 years: NO If all of the above answers are "NO", then may proceed with Cephalosporin use.  . Clindamycin/Lincomycin Rash    Social History   Socioeconomic History  . Marital status: Single    Spouse name: Not on file  . Number of children: 0  . Years of education: Not on file  . Highest education level: Not on file  Occupational History  . Occupation: Retired  Tobacco Use  . Smoking status: Never Smoker  . Smokeless tobacco: Never Used  Vaping Use  . Vaping Use: Never used  Substance and Sexual Activity  . Alcohol use: No  . Drug use: No  . Sexual activity: Not Currently  Other Topics Concern  . Not on file  Social History Narrative  . Not on file   Social Determinants of Health   Financial Resource Strain: Not on file  Food Insecurity: Not on file  Transportation Needs: Not on file  Physical Activity: Not on file  Stress: Not on file  Social Connections: Not on file  Intimate Partner Violence: Not on file     Review of Systems: General: negative for chills, fever, night sweats or weight changes.  Cardiovascular: negative for chest pain, dyspnea on exertion, edema, orthopnea, palpitations, paroxysmal nocturnal dyspnea or shortness of breath Dermatological: negative for rash Respiratory: negative  for cough or wheezing Urologic: negative for hematuria Abdominal: negative for nausea, vomiting, diarrhea, bright red blood per rectum, melena, or hematemesis Neurologic: negative for visual changes, syncope, or dizziness All other systems reviewed and are otherwise negative except as noted above.    Blood pressure (!) 114/54, pulse 84, height 5\' 6"  (1.676 m), weight 262 lb (118.8 kg), last menstrual period 12/31/2012.  General appearance: alert and no distress Neck: no adenopathy, no carotid bruit, no JVD, supple, symmetrical, trachea midline and thyroid not enlarged, symmetric, no tenderness/mass/nodules Lungs: clear to auscultation bilaterally Heart: regular rate and rhythm, S1, S2 normal, no murmur, click, rub or gallop Extremities: extremities normal, atraumatic, no cyanosis or edema Pulses: 2+ and symmetric Skin: Skin color, texture, turgor normal. No rashes or lesions Neurologic: Alert and oriented X 3, normal strength and tone. Normal symmetric reflexes. Normal coordination and gait  EKG not performed today  ASSESSMENT AND PLAN:   Peripheral arterial disease (HCC) Ms. Pridmore returns today for follow-up of her PAD.  She does still is experiencing lifestyle limiting right calf claudication.  She had Doppler studies performed 02/27/2020 revealing right ABI of 0.48 and a left of 0.84.  She did have a high-frequency signal in her mid right SFA with occluded posterior tibial artery.  She wishes to proceed with angiography and endovascular therapy.      Kj Imbert J. Duc Crocket MD FACP,FACC,FAHA, FSCAI 05/27/2020 10:54 AM 

## 2020-05-27 NOTE — H&P (View-Only) (Signed)
05/27/2020 Monica Daniels   [birth year]  [id number]  Primary Physician Shon Hale, MD Primary Cardiologist: Runell Gess MD Nicholes Calamity, [place]  HPI:  Monica Daniels is a 60 y.o.  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 She is referred by her podiatrist, Dr. Ernestene Kiel, for peripheral vascular valuation because of an ulcer on her right first metatarsal head.I last saw her in the office  02/28/2020. Her cardiac risk factors are notable for treated hypertension, diabetes and hyperlipidemia. Unfortunately her hemoglobin A1c's have remained above 8. She does have a family history for heart disease in his mother who had CABG. Her mother, Monica Daniels, was a patient of mine as well who died in 22-Apr-2018. She is never had a heart attack or stroke. She denies chest pain or shortness of breath,but does sometimes have burning in her chest which she is attributed to reflux. She does not exercise much but denies claudication as well.  Her wound on her right great toe has since healed. Her Doppler studies performed 06/06/2019 revealed a right ABI 0.75 and a left of 0.82 with a high-frequency signal in her distal right SFA as well as tibial vessel disease. She does complain now of right calf claudication. Because of her complaints of chest pain I performed a coronary CTA with FFR analysis 08/01/2019 suggesting at least two-vessel disease. Based on this I recommend that we proceed with outpatient diagnostic coronary angiography. I have asked her to begin taking a baby as  Outpatient cardiac cath on 08/12/2019 revealing severe proximal RCA, proximal mid LAD diseasepirin.. 3 days later after performed outpatient RCA stenting, proximal LAD orbital atherectomy, PCI and stenting as well as stenting of her mid LAD. She is done well since then feels clinically improved. She denies chest pain or  shortness of breath.  She was participating cardiac rehab.  She has noticed lifestyle limiting right calf claudication.  Recent Doppler studies performed 02/27/2020 revealed a right ABI of 0.48 and a left of 0.84 with a high-frequency signal in her mid right SFA.  Since I saw her 3 months ago she continues to experience right calf claudication.  She wishes to proceed with peripheral angiography and endovascular therapy for lifestyle limiting claudication.    Current Meds  Medication Sig  . aspirin 81 MG EC tablet TAKE 1 TABLET BY MOUTH DAILY. SWALLOW WHOLE.  [name] atorvastatin (LIPITOR) 80 MG tablet TAKE 1 TABLET BY MOUTH EVERY DAY  . B-D UF III MINI PEN NEEDLES 31G X 5 MM MISC USE TO INJECT LANTUS DAILY AS DIRECTED  . clopidogrel (PLAVIX) 75 MG tablet Take 1 tablet (75 mg total) by mouth daily.  [name] COVID-19 mRNA Vac-TriS, Pfizer, (PFIZER-BIONT COVID-19 VAC-TRIS) SUSP injection Inject into the muscle.  . cyanocobalamin 1000 MCG tablet Take 1,000 mcg by mouth daily.   . Dulaglutide (TRULICITY) 1.5 MG/0.5ML SOPN Inject 1.5 mg into the skin once a week.  . ezetimibe (ZETIA) 10 MG tablet Take 1 tablet (10 mg total) by mouth daily.  [name] FARXIGA 10 MG TABS tablet Take 10 mg by mouth daily.  . hyoscyamine (LEVSIN SL) 0.125 MG SL tablet Place 1 tablet (0.125 mg total) under the tongue every 6 (six) hours as needed. Take one tablet every 4-6 hours as needed for abdominal cramping  . insulin glargine (LANTUS) 100 UNIT/ML injection Inject 8 Units into the skin at bedtime.  [name]  ketorolac (ACULAR) 0.5 % ophthalmic solution Place 1 drop into the right eye 4 (four) times daily.  [name] latanoprost (XALATAN) 0.005 % ophthalmic solution Place 1 drop into both eyes at bedtime.   [name] lisinopril (ZESTRIL) 10 MG tablet Take 10 mg by mouth daily.  [name] loratadine (CLARITIN) 10 MG tablet Take 10 mg by mouth daily.  . metFORMIN (GLUCOPHAGE-XR) 500 MG 24 hr tablet Take 1,000 mg by mouth 2 (two) times daily.  . Multiple Vitamin  (MULTIVITAMIN WITH MINERALS) TABS tablet Take 1 tablet by mouth daily.  . nitroGLYCERIN (NITROSTAT) 0.4 MG SL tablet Place 1 tablet (0.4 mg total) under the tongue every 5 (five) minutes as needed for chest pain.  Letta Pate VERIO test strip 3 (three) times daily.     Allergies  Allergen Reactions  . Penicillins     Swelling Has patient had a PCN reaction causing immediate rash, facial/tongue/throat swelling, SOB or lightheadedness with hypotension: YES Has patient had a PCN reaction causing severe rash involving mucus membranes or skin necrosis: NO Has patient had a PCN reaction that required hospitalization NO Has patient had a PCN reaction occurring within the last 10 years: NO If all of the above answers are "NO", then may proceed with Cephalosporin use.  . Clindamycin/Lincomycin Rash    Social History   Socioeconomic History  . Marital status: Single    Spouse name: Not on file  . Number of children: 0  . Years of education: Not on file  . Highest education level: Not on file  Occupational History  . Occupation: Retired  Tobacco Use  . Smoking status: Never Smoker  . Smokeless tobacco: Never Used  Vaping Use  . Vaping Use: Never used  Substance and Sexual Activity  . Alcohol use: No  . Drug use: No  . Sexual activity: Not Currently  Other Topics Concern  . Not on file  Social History Narrative  . Not on file   Social Determinants of Health   Financial Resource Strain: Not on file  Food Insecurity: Not on file  Transportation Needs: Not on file  Physical Activity: Not on file  Stress: Not on file  Social Connections: Not on file  Intimate Partner Violence: Not on file     Review of Systems: General: negative for chills, fever, night sweats or weight changes.  Cardiovascular: negative for chest pain, dyspnea on exertion, edema, orthopnea, palpitations, paroxysmal nocturnal dyspnea or shortness of breath Dermatological: negative for rash Respiratory: negative  for cough or wheezing Urologic: negative for hematuria Abdominal: negative for nausea, vomiting, diarrhea, bright red blood per rectum, melena, or hematemesis Neurologic: negative for visual changes, syncope, or dizziness All other systems reviewed and are otherwise negative except as noted above.    Blood pressure (!) 114/54, pulse 84, height 5\' 6"  (1.676 m), weight 262 lb (118.8 kg), last menstrual period 12/31/2012.  General appearance: alert and no distress Neck: no adenopathy, no carotid bruit, no JVD, supple, symmetrical, trachea midline and thyroid not enlarged, symmetric, no tenderness/mass/nodules Lungs: clear to auscultation bilaterally Heart: regular rate and rhythm, S1, S2 normal, no murmur, click, rub or gallop Extremities: extremities normal, atraumatic, no cyanosis or edema Pulses: 2+ and symmetric Skin: Skin color, texture, turgor normal. No rashes or lesions Neurologic: Alert and oriented X 3, normal strength and tone. Normal symmetric reflexes. Normal coordination and gait  EKG not performed today  ASSESSMENT AND PLAN:   Peripheral arterial disease (HCC) Ms. Pridmore returns today for follow-up of her PAD.  She does still is experiencing lifestyle limiting right calf claudication.  She had Doppler studies performed 02/27/2020 revealing right ABI of 0.48 and a left of 0.84.  She did have a high-frequency signal in her mid right SFA with occluded posterior tibial artery.  She wishes to proceed with angiography and endovascular therapy.      Runell Gess MD FACP,FACC,FAHA, [facility] 05/27/2020 10:54 AM

## 2020-05-28 ENCOUNTER — Telehealth: Payer: Self-pay | Admitting: *Deleted

## 2020-05-28 LAB — BASIC METABOLIC PANEL
BUN/Creatinine Ratio: 19 (ref 12–28)
BUN: 17 mg/dL (ref 8–27)
CO2: 23 mmol/L (ref 20–29)
Calcium: 9.3 mg/dL (ref 8.7–10.3)
Chloride: 100 mmol/L (ref 96–106)
Creatinine, Ser: 0.89 mg/dL (ref 0.57–1.00)
Glucose: 149 mg/dL — ABNORMAL HIGH (ref 65–99)
Potassium: 4.3 mmol/L (ref 3.5–5.2)
Sodium: 138 mmol/L (ref 134–144)
eGFR: 73 mL/min/{1.73_m2} (ref >59)

## 2020-05-28 LAB — CBC
Hematocrit: 41.2 % (ref 34.0–46.6)
Hemoglobin: 13.7 g/dL (ref 11.1–15.9)
MCH: 28 pg (ref 26.6–33.0)
MCHC: 33.3 g/dL (ref 31.5–35.7)
MCV: 84 fL (ref 79–97)
Platelets: 317 10*3/uL (ref 150–450)
RBC: 4.9 x10E6/uL (ref 3.77–5.28)
RDW: 13.1 % (ref 11.7–15.4)
WBC: 6 10*3/uL (ref 3.4–10.8)

## 2020-05-28 NOTE — Telephone Encounter (Signed)
Pt contacted pre-catheterization scheduled at [facility] for: Monday Jun 01, 2020 7:30 AM Verified arrival time and place: [facility] Main Entrance [facility]) at: 5:30 AM   No solid food after midnight prior to cath, clear liquids until 5 AM day of procedure.  Hold: Metformin-day of procedure and 48 hours post procedure Farxiga-AM of procedure Insulin-1/2 usual dose PM prior to procedure  Except hold medications AM meds can be  taken pre-cath with sips of water including: ASA 81 mg Plavix 75 mg  Confirmed patient has responsible adult to drive home post procedure and be with patient first 24 hours after arriving home: yes  You are allowed ONE visitor in the waiting room during the time you are at the hospital for your procedure. Both you and your visitor must wear a mask once you enter the hospital.  Reviewed procedure/mask/visitor instructions with patient.

## 2020-05-29 ENCOUNTER — Other Ambulatory Visit (HOSPITAL_COMMUNITY)
Admission: RE | Admit: 2020-05-29 | Discharge: 2020-05-29 | Disposition: A | Discharge status: Home / Self Care | Payer: Federal, State, Local not specified - PPO | Source: Ambulatory Visit | Attending: Cardiovascular Disease | Admitting: Cardiovascular Disease

## 2020-05-29 DIAGNOSIS — Z7984 Long term (current) use of oral hypoglycemic drugs: Secondary | ICD-10-CM | POA: Diagnosis not present

## 2020-05-29 DIAGNOSIS — Z8249 Family history of ischemic heart disease and other diseases of the circulatory system: Secondary | ICD-10-CM | POA: Diagnosis not present

## 2020-05-29 DIAGNOSIS — Z79899 Other long term (current) drug therapy: Secondary | ICD-10-CM | POA: Diagnosis not present

## 2020-05-29 DIAGNOSIS — Z28311 Partially vaccinated for covid-19: Secondary | ICD-10-CM | POA: Diagnosis not present

## 2020-05-29 DIAGNOSIS — Z7902 Long term (current) use of antithrombotics/antiplatelets: Secondary | ICD-10-CM | POA: Diagnosis not present

## 2020-05-29 DIAGNOSIS — Z01812 Encounter for preprocedural laboratory examination: Secondary | ICD-10-CM | POA: Insufficient documentation

## 2020-05-29 DIAGNOSIS — Z881 Allergy status to other antibiotic agents status: Secondary | ICD-10-CM | POA: Diagnosis not present

## 2020-05-29 DIAGNOSIS — I70211 Atherosclerosis of native arteries of extremities with intermittent claudication, right leg: Secondary | ICD-10-CM | POA: Diagnosis not present

## 2020-05-29 DIAGNOSIS — Z20822 Contact with and (suspected) exposure to covid-19: Secondary | ICD-10-CM | POA: Insufficient documentation

## 2020-05-29 DIAGNOSIS — Z7982 Long term (current) use of aspirin: Secondary | ICD-10-CM | POA: Diagnosis not present

## 2020-05-29 DIAGNOSIS — Z794 Long term (current) use of insulin: Secondary | ICD-10-CM | POA: Diagnosis not present

## 2020-05-29 DIAGNOSIS — Z880 Allergy status to penicillin: Secondary | ICD-10-CM | POA: Diagnosis not present

## 2020-05-30 LAB — SARS CORONAVIRUS 2 (TAT 6-24 HRS): SARS Coronavirus 2: NEGATIVE

## 2020-06-01 ENCOUNTER — Encounter (HOSPITAL_COMMUNITY)
Admission: RE | Disposition: A | Discharge status: Home / Self Care | Payer: Self-pay | Source: Home / Self Care | Attending: Cardiovascular Disease

## 2020-06-01 ENCOUNTER — Ambulatory Visit (HOSPITAL_COMMUNITY)
Admission: RE | Admit: 2020-06-01 | Discharge: 2020-06-01 | Disposition: A | Discharge status: Home / Self Care | Payer: Federal, State, Local not specified - PPO | Attending: Cardiovascular Disease | Admitting: Cardiovascular Disease

## 2020-06-01 DIAGNOSIS — Z7902 Long term (current) use of antithrombotics/antiplatelets: Secondary | ICD-10-CM | POA: Insufficient documentation

## 2020-06-01 DIAGNOSIS — Z8249 Family history of ischemic heart disease and other diseases of the circulatory system: Secondary | ICD-10-CM | POA: Diagnosis not present

## 2020-06-01 DIAGNOSIS — I70211 Atherosclerosis of native arteries of extremities with intermittent claudication, right leg: Secondary | ICD-10-CM | POA: Insufficient documentation

## 2020-06-01 DIAGNOSIS — Z794 Long term (current) use of insulin: Secondary | ICD-10-CM | POA: Insufficient documentation

## 2020-06-01 DIAGNOSIS — Z20822 Contact with and (suspected) exposure to covid-19: Secondary | ICD-10-CM | POA: Insufficient documentation

## 2020-06-01 DIAGNOSIS — Z7984 Long term (current) use of oral hypoglycemic drugs: Secondary | ICD-10-CM | POA: Insufficient documentation

## 2020-06-01 DIAGNOSIS — I2510 Atherosclerotic heart disease of native coronary artery without angina pectoris: Secondary | ICD-10-CM | POA: Diagnosis present

## 2020-06-01 DIAGNOSIS — Z881 Allergy status to other antibiotic agents status: Secondary | ICD-10-CM | POA: Diagnosis not present

## 2020-06-01 DIAGNOSIS — Z79899 Other long term (current) drug therapy: Secondary | ICD-10-CM | POA: Insufficient documentation

## 2020-06-01 DIAGNOSIS — Z28311 Partially vaccinated for covid-19: Secondary | ICD-10-CM | POA: Diagnosis not present

## 2020-06-01 DIAGNOSIS — Z7982 Long term (current) use of aspirin: Secondary | ICD-10-CM | POA: Insufficient documentation

## 2020-06-01 DIAGNOSIS — Z880 Allergy status to penicillin: Secondary | ICD-10-CM | POA: Diagnosis not present

## 2020-06-01 HISTORY — PX: PERIPHERAL VASCULAR INTERVENTION: CATH118257

## 2020-06-01 HISTORY — PX: ABDOMINAL AORTOGRAM W/LOWER EXTREMITY: CATH118223

## 2020-06-01 HISTORY — PX: PERIPHERAL VASCULAR ATHERECTOMY: CATH118256

## 2020-06-01 LAB — POCT ACTIVATED CLOTTING TIME
Activated Clotting Time: 356 seconds
Activated Clotting Time: 273 seconds

## 2020-06-01 LAB — GLUCOSE, CAPILLARY
Glucose-Capillary: 153 mg/dL — ABNORMAL HIGH (ref 70–99)
Glucose-Capillary: 124 mg/dL — ABNORMAL HIGH (ref 70–99)

## 2020-06-01 SURGERY — ABDOMINAL AORTOGRAM W/LOWER EXTREMITY
Anesthesia: LOCAL

## 2020-06-01 MED ORDER — HYDRALAZINE HCL 20 MG/ML IJ SOLN: 5.0000 mg | INTRAMUSCULAR | Status: DC | PRN

## 2020-06-01 MED ORDER — ASPIRIN 81 MG PO CHEW: 81.0000 mg | CHEWABLE_TABLET | ORAL | Status: DC

## 2020-06-01 MED ORDER — IOHEXOL 350 MG/ML SOLN
INTRAVENOUS | Status: AC
  Filled 2020-06-01: qty 1

## 2020-06-01 MED ORDER — LIDOCAINE HCL (PF) 1 % IJ SOLN
INTRAMUSCULAR | Status: AC
  Filled 2020-06-01: qty 30

## 2020-06-01 MED ORDER — ACETAMINOPHEN 325 MG PO TABS: 650.0000 mg | ORAL_TABLET | ORAL | Status: DC | PRN

## 2020-06-01 MED ORDER — ASPIRIN EC 81 MG PO TBEC: 81.0000 mg | DELAYED_RELEASE_TABLET | Freq: Every day | ORAL | Status: DC

## 2020-06-01 MED ORDER — CLOPIDOGREL BISULFATE 75 MG PO TABS: 75.0000 mg | ORAL_TABLET | Freq: Every day | ORAL | Status: DC

## 2020-06-01 MED ORDER — SODIUM CHLORIDE 0.9% FLUSH: 3.0000 mL | Freq: Two times a day (BID) | INTRAVENOUS | Status: DC

## 2020-06-01 MED ORDER — ONDANSETRON HCL 4 MG/2ML IJ SOLN: 4.0000 mg | Freq: Four times a day (QID) | INTRAMUSCULAR | Status: DC | PRN

## 2020-06-01 MED ORDER — HEPARIN (PORCINE) IN NACL 1000-0.9 UT/500ML-% IV SOLN
INTRAVENOUS | Status: DC | PRN
  Administered 2020-06-01 (×2): 500 mL

## 2020-06-01 MED ORDER — SODIUM CHLORIDE 0.9 % IV SOLN: 250.0000 mL | INTRAVENOUS | Status: DC | PRN

## 2020-06-01 MED ORDER — HEPARIN (PORCINE) IN NACL 1000-0.9 UT/500ML-% IV SOLN
INTRAVENOUS | Status: AC
  Filled 2020-06-01: qty 1000

## 2020-06-01 MED ORDER — SODIUM CHLORIDE 0.9% FLUSH: 3.0000 mL | INTRAVENOUS | Status: DC | PRN

## 2020-06-01 MED ORDER — LIDOCAINE-EPINEPHRINE 1 %-1:100000 IJ SOLN
INTRAMUSCULAR | Status: AC
  Filled 2020-06-01: qty 1

## 2020-06-01 MED ORDER — CLOPIDOGREL BISULFATE 75 MG PO TABS: 75.0000 mg | ORAL_TABLET | ORAL | Status: DC

## 2020-06-01 MED ORDER — IODIXANOL 320 MG/ML IV SOLN
INTRAVENOUS | Status: DC | PRN
  Administered 2020-06-01: 170 mL via INTRA_ARTERIAL

## 2020-06-01 MED ORDER — HEPARIN SODIUM (PORCINE) 1000 UNIT/ML IJ SOLN
INTRAMUSCULAR | Status: AC
  Filled 2020-06-01: qty 1

## 2020-06-01 MED ORDER — MORPHINE SULFATE (PF) 2 MG/ML IV SOLN
2.0000 mg | INTRAVENOUS | Status: DC | PRN
Start: 2020-06-01 — End: 2020-06-01

## 2020-06-01 MED ORDER — LABETALOL HCL 5 MG/ML IV SOLN: 10.0000 mg | INTRAVENOUS | Status: DC | PRN

## 2020-06-01 MED ORDER — SODIUM CHLORIDE 0.9 % WEIGHT BASED INFUSION
3.0000 mL/kg/h | INTRAVENOUS | Status: AC
  Administered 2020-06-01: 3 mL/kg/h via INTRAVENOUS

## 2020-06-01 MED ORDER — SODIUM CHLORIDE 0.9 % WEIGHT BASED INFUSION: 1.0000 mL/kg/h | INTRAVENOUS | Status: DC

## 2020-06-01 MED ORDER — HYDRALAZINE HCL 20 MG/ML IJ SOLN
INTRAMUSCULAR | Status: DC | PRN
  Administered 2020-06-01: 10 mg via INTRAVENOUS
  Administered 2020-06-01: 5 mg via INTRAVENOUS

## 2020-06-01 MED ORDER — LIDOCAINE-EPINEPHRINE 1 %-1:100000 IJ SOLN
INTRAMUSCULAR | Status: DC | PRN
  Administered 2020-06-01: 1 mL

## 2020-06-01 MED ORDER — SODIUM CHLORIDE 0.9 % IV SOLN: INTRAVENOUS | Status: DC

## 2020-06-01 MED ORDER — HYDRALAZINE HCL 20 MG/ML IJ SOLN
INTRAMUSCULAR | Status: AC
  Filled 2020-06-01: qty 1

## 2020-06-01 MED ORDER — HEPARIN SODIUM (PORCINE) 1000 UNIT/ML IJ SOLN
INTRAMUSCULAR | Status: DC | PRN
  Administered 2020-06-01: 12000 [IU] via INTRAVENOUS

## 2020-06-01 MED ORDER — LIDOCAINE HCL (PF) 1 % IJ SOLN
INTRAMUSCULAR | Status: DC | PRN
  Administered 2020-06-01: 20 mL

## 2020-06-01 SURGICAL SUPPLY — 30 items
BAG SNAP BAND KOVER 36X36 (MISCELLANEOUS) ×3 IMPLANT
BALLN IN.PACT DCB 5X120 (BALLOONS) ×3
BALLN JADE .014 2.5 X 80 (BALLOONS) ×3
BALLN JADE .018 5.0 X 100 (BALLOONS) ×3
BALLOON JADE .014 2.5 X 80 (BALLOONS) ×2 IMPLANT
BALLOON JADE .018 5.0 X 100 (BALLOONS) ×2 IMPLANT
CATH ANGIO 5F PIGTAIL 65CM (CATHETERS) ×3 IMPLANT
CATH CROSS OVER TEMPO 5F (CATHETERS) ×3 IMPLANT
CATH HAWKONE LX EXTENDED TIP (CATHETERS) ×3 IMPLANT
CATH STRAIGHT 5FR 65CM (CATHETERS) ×3 IMPLANT
CLOSURE PERCLOSE PROSTYLE (VASCULAR PRODUCTS) ×3 IMPLANT
DCB IN.PACT 5X120 (BALLOONS) ×2 IMPLANT
DEVICE SPIDERFX EMB PROT 6MM (WIRE) ×3 IMPLANT
DEVICE TORQUE H2O (MISCELLANEOUS) ×3 IMPLANT
GUIDEWIRE ANGLED .035X260CM (WIRE) ×3 IMPLANT
GUIDEWIRE ZILIENT 6G 014 (WIRE) ×3 IMPLANT
KIT ENCORE 26 ADVANTAGE (KITS) ×3 IMPLANT
KIT PV (KITS) ×3 IMPLANT
SHEATH HIGHFLEX ANSEL 7FR 55CM (SHEATH) ×3 IMPLANT
SHEATH PINNACLE 5F 10CM (SHEATH) ×3 IMPLANT
SHEATH PINNACLE 7F 10CM (SHEATH) ×3 IMPLANT
STENT INNOVA 6X150X130 (Permanent Stent) ×3 IMPLANT
STOPCOCK MORSE 400PSI 3WAY (MISCELLANEOUS) ×3 IMPLANT
SYR MEDRAD MARK 7 150ML (SYRINGE) ×3 IMPLANT
TAPE VIPERTRACK RADIOPAQ (MISCELLANEOUS) ×2 IMPLANT
TAPE VIPERTRACK RADIOPAQUE (MISCELLANEOUS) ×1
TRANSDUCER W/STOPCOCK (MISCELLANEOUS) ×3 IMPLANT
TRAY PV CATH (CUSTOM PROCEDURE TRAY) ×3 IMPLANT
TUBING CIL FLEX 10 FLL-RA (TUBING) ×3 IMPLANT
WIRE HITORQ VERSACORE ST 145CM (WIRE) ×3 IMPLANT

## 2020-06-01 NOTE — Discharge Instructions (Signed)
Femoral Site Care  This sheet gives you information about how to care for yourself after your procedure. Your health care provider may also give you more specific instructions. If you have problems or questions, contact your health care provider. What can I expect after the procedure? After the procedure, it is common to have:  Bruising that usually fades within 1-2 weeks.  Tenderness at the site. Follow these instructions at home: Wound care  Follow instructions from your health care provider about how to take care of your insertion site. Make sure you: ? Wash your hands with soap and water before you change your bandage (dressing). If soap and water are not available, use hand sanitizer. ? Change your dressing as told by your health care provider. ? Leave stitches (sutures), skin glue, or adhesive strips in place. These skin closures may need to stay in place for 2 weeks or longer. If adhesive strip edges start to loosen and curl up, you may trim the loose edges. Do not remove adhesive strips completely unless your health care provider tells you to do that.  Do not take baths, swim, or use a hot tub until your health care provider approves.  You may shower 24-48 hours after the procedure or as told by your health care provider. ? Gently wash the site with plain soap and water. ? Pat the area dry with a clean towel. ? Do not rub the site. This may cause bleeding.  Do not apply powder or lotion to the site. Keep the site clean and dry.  Check your femoral site every day for signs of infection. Check for: ? Redness, swelling, or pain. ? Fluid or blood. ? Warmth. ? Pus or a bad smell. Activity  For the first 2-3 days after your procedure, or as long as directed: ? Avoid climbing stairs as much as possible. ? Do not squat.  Do not lift anything that is heavier than 10 lb (4.5 kg), or the limit that you are told, until your health care provider says that it is safe.  Rest as  directed. ? Avoid sitting for a long time without moving. Get up to take short walks every 1-2 hours.  Do not drive for 24 hours if you were given a medicine to help you relax (sedative). General instructions  Take over-the-counter and prescription medicines only as told by your health care provider.  Keep all follow-up visits as told by your health care provider. This is important. Contact a health care provider if you have:  A fever or chills.  You have redness, swelling, or pain around your insertion site. Get help right away if:  The catheter insertion area swells very fast.  You pass out.  You suddenly start to sweat or your skin gets clammy.  The catheter insertion area is bleeding, and the bleeding does not stop when you hold steady pressure on the area.  The area near or just beyond the catheter insertion site becomes pale, cool, tingly, or numb. These symptoms may represent a serious problem that is an emergency. Do not wait to see if the symptoms will go away. Get medical help right away. Call your local emergency services (911 in the [place] Do not drive yourself to the hospital. Summary  After the procedure, it is common to have bruising that usually fades within 1-2 weeks.  Check your femoral site every day for signs of infection.  Do not lift anything that is heavier than 10 lb (4.5 kg), or   the limit that you are told, until your health care provider says that it is safe. This information is not intended to replace advice given to you by your health care provider. Make sure you discuss any questions you have with your health care provider. Document Revised: 09/13/2019 Document Reviewed: 09/13/2019 Elsevier Patient Education  2021 Elsevier Inc. Moderate Conscious Sedation, Adult, Care After This sheet gives you information about how to care for yourself after your procedure. Your health care provider may also give you more specific instructions. If you have problems  or questions, contact your health care provider. What can I expect after the procedure? After the procedure, it is common to have:  Sleepiness for several hours.  Impaired judgment for several hours.  Difficulty with balance.  Vomiting if you eat too soon. Follow these instructions at home: For the time period you were told by your health care provider:  Rest.  Do not participate in activities where you could fall or become injured.  Do not drive or use machinery.  Do not drink alcohol.  Do not take sleeping pills or medicines that cause drowsiness.  Do not make important decisions or sign legal documents.  Do not take care of children on your own.      Eating and drinking  Follow the diet recommended by your health care provider.  Drink enough fluid to keep your urine pale yellow.  If you vomit: ? Drink water, juice, or soup when you can drink without vomiting. ? Make sure you have little or no nausea before eating solid foods.   General instructions  Take over-the-counter and prescription medicines only as told by your health care provider.  Have a responsible adult stay with you for the time you are told. It is important to have someone help care for you until you are awake and alert.  Do not smoke.  Keep all follow-up visits as told by your health care provider. This is important. Contact a health care provider if:  You are still sleepy or having trouble with balance after 24 hours.  You feel light-headed.  You keep feeling nauseous or you keep vomiting.  You develop a rash.  You have a fever.  You have redness or swelling around the IV site. Get help right away if:  You have trouble breathing.  You have new-onset confusion at home. Summary  After the procedure, it is common to feel sleepy, have impaired judgment, or feel nauseous if you eat too soon.  Rest after you get home. Know the things you should not do after the procedure.  Follow the  diet recommended by your health care provider and drink enough fluid to keep your urine pale yellow.  Get help right away if you have trouble breathing or new-onset confusion at home. This information is not intended to replace advice given to you by your health care provider. Make sure you discuss any questions you have with your health care provider. Document Revised: 05/10/2019 Document Reviewed: 12/06/2018 Elsevier Patient Education  2021 Elsevier Inc.  

## 2020-06-01 NOTE — Interval H&P Note (Signed)
History and Physical Interval Note:  06/01/2020 7:43 AM  Monica Daniels  has presented today for surgery, with the diagnosis of pad.  The various methods of treatment have been discussed with the patient and family. After consideration of risks, benefits and other options for treatment, the patient has consented to  Procedure(s): ABDOMINAL AORTOGRAM W/LOWER EXTREMITY (N/A) as a surgical intervention.  The patient's history has been reviewed, patient examined, no change in status, stable for surgery.  I have reviewed the patient's chart and labs.  Questions were answered to the patient's satisfaction.     [name]

## 2020-06-02 ENCOUNTER — Encounter (HOSPITAL_COMMUNITY): Payer: Self-pay | Admitting: Cardiovascular Disease

## 2020-06-09 ENCOUNTER — Other Ambulatory Visit (HOSPITAL_COMMUNITY): Payer: Self-pay | Admitting: Cardiovascular Disease

## 2020-06-09 ENCOUNTER — Other Ambulatory Visit: Payer: Self-pay

## 2020-06-09 ENCOUNTER — Ambulatory Visit (HOSPITAL_COMMUNITY)
Admission: RE | Admit: 2020-06-09 | Discharge: 2020-06-09 | Disposition: A | Discharge status: Home / Self Care | Payer: Federal, State, Local not specified - PPO | Source: Ambulatory Visit | Attending: Cardiovascular Disease | Admitting: Cardiovascular Disease

## 2020-06-09 DIAGNOSIS — I739 Peripheral vascular disease, unspecified: Secondary | ICD-10-CM | POA: Diagnosis not present

## 2020-06-09 DIAGNOSIS — Z9862 Peripheral vascular angioplasty status: Secondary | ICD-10-CM

## 2020-06-09 NOTE — Telephone Encounter (Signed)
Spoke with pt regarding insertion site for PV angiogram. Pt has not removed dressing. Instructed pt that she can removed dressing and clean site with damp cloth.  Instructed pt that she can leave area open to air or cover insertion site with a bandaid if she would like to keep if covered. Pt verbalizes understanding and is thankful for the call. Pt will follow up if there is any issues with the site.

## 2020-06-09 NOTE — Telephone Encounter (Signed)
Patient is returning call.  °

## 2020-06-09 NOTE — Telephone Encounter (Signed)
Left message for pt to call back  °

## 2020-06-16 ENCOUNTER — Ambulatory Visit: Payer: Federal, State, Local not specified - PPO | Admitting: Cardiovascular Disease

## 2020-06-16 ENCOUNTER — Encounter: Payer: Self-pay | Admitting: Cardiovascular Disease

## 2020-06-16 ENCOUNTER — Other Ambulatory Visit: Payer: Self-pay

## 2020-06-16 VITALS — BP 118/52 | HR 88 | Ht 67.0 in | Wt 255.0 lb

## 2020-06-16 DIAGNOSIS — I739 Peripheral vascular disease, unspecified: Secondary | ICD-10-CM

## 2020-06-16 NOTE — Assessment & Plan Note (Signed)
Monica Daniels returns today for post hospital follow-up.  I performed right SFA directional atherectomy followed by drug-coated balloon and angioplasty and ultimately Innova stenting of a 95% segmental mid right SFA stenosis with two-vessel runoff on 06/01/2020 for lifestyle limiting right calf claudication.  Follow-up Dopplers revealed a widely patent right SFA with an ABI that has increased from 0.48 up to 0.94.  We will recheck lower extremity arterial Doppler studies in 6 months.

## 2020-06-16 NOTE — Patient Instructions (Signed)
Medication Instructions:  Your physician recommends that you continue on your current medications as directed. Please refer to the Current Medication list given to you today.  *If you need a refill on your cardiac medications before your next appointment, please call your pharmacy*   Testing/Procedures: Your physician has requested that you have a lower extremity arterial duplex. This test is an ultrasound of the arteries in the legs. It looks at arterial blood flow in the legs. Allow one hour for Lower Arterial scans. There are no restrictions or special instructions.  Your physician has requested that you have an ankle brachial index (ABI). During this test an ultrasound and blood pressure cuff are used to evaluate the arteries that supply the arms and legs with blood. Allow thirty minutes for this exam. There are no restrictions or special instructions.  These procedures are done at [address] [facility] 2nd [address]. To be done in November 2022.   Follow-Up: At [facility] ([facility] you and your health needs are our priority.  As part of our continuing mission to provide you with exceptional heart care, we have created designated Provider Care Teams.  These Care Teams include your primary Cardiologist (physician) and Advanced Practice Providers (APPs -  Physician Assistants and Nurse Practitioners) who all work together to provide you with the care you need, when you need it.  We recommend signing up for the patient portal called "MyChart".  Sign up information is provided on this After Visit Summary.  MyChart is used to connect with patients for Virtual Visits (Telemedicine).  Patients are able to view lab/test results, encounter notes, upcoming appointments, etc.  Non-urgent messages can be sent to your provider as well.   To learn more about what you can do with MyChart, go to [email or web address].    Your next appointment:   12 month(s)  The format for your next appointment:   In  Person  Provider:   [name], MD

## 2020-06-16 NOTE — Progress Notes (Signed)
06/16/2020 Monica Daniels   1961-01-23  [id number]  Primary Physician Shon Hale, MD Primary Cardiologist: Runell Gess MD Nicholes Calamity, [place]  HPI:  Monica Daniels is a 60 y.o. 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 She is referred by her podiatrist, Dr. Ernestene Kiel, for peripheral vascular valuation because of an ulcer on her right first metatarsal head.I last saw her in the office 05/27/2020. Her cardiac risk factors are notable for treated hypertension, diabetes and hyperlipidemia. Unfortunately her hemoglobin A1c's have remained above 8. She does have a family history for heart disease in his mother who had CABG. Her mother, Monica Daniels, was a patient of mine as well who died in 2018/04/19. She is never had a heart attack or stroke. She denies chest pain or shortness of breath,but does sometimes have burning in her chest which she is attributed to reflux. She does not exercise much but denies claudication as well.  Her wound on her right great toe has since healed. Her Doppler studies performed 06/06/2019 revealed a right ABI 0.75 and a left of 0.82 with a high-frequency signal in her distal right SFA as well as tibial vessel disease. She does complain now of right calf claudication. Because of her complaints of chest pain I performed a coronary CTA with FFR analysis 08/01/2019 suggesting at least two-vessel disease. Based on this I recommend that we proceed with outpatient diagnostic coronary angiography. I have asked her to begin taking a baby as  Outpatient cardiac cath on 08/12/2019 revealing severe proximal RCA, proximal mid LAD diseasepirin.. 3 days later after performed outpatient RCA stenting, proximal LAD orbital atherectomy, PCI and stenting as well as stenting of her mid LAD. She is done well since then feels clinically improved. She denies chest  pain or shortness of breath.  She was participating cardiac rehab. She has noticed lifestyle limiting right calf claudication. Recent Doppler studies performed 02/27/2020 revealed a right ABI of 0.48 and a left of 0.84 with a high-frequency signal in her mid right SFA.  When I saw her last she was complaining of right calf claudication especially during cardiac rehab.  I performed peripheral angiography 06/01/2020 revealing a 95% segmental mid right SFA stenosis.  I performed directional atherectomy, drug-coated balloon angioplasty and a nova self-expanding stenting with excellent result.  She had two-vessel runoff.  Her ABI did increase from 0.48 up to 0.94.  Her claudication has resolved.   Current Meds  Medication Sig  . aspirin 81 MG EC tablet TAKE 1 TABLET BY MOUTH DAILY. SWALLOW WHOLE. (Patient taking differently: Take 81 mg by mouth daily.)  . atorvastatin (LIPITOR) 80 MG tablet TAKE 1 TABLET BY MOUTH EVERY DAY (Patient taking differently: Take 80 mg by mouth at bedtime.)  . B-D UF III MINI PEN NEEDLES 31G X 5 MM MISC USE TO INJECT LANTUS DAILY AS DIRECTED  . clopidogrel (PLAVIX) 75 MG tablet Take 1 tablet (75 mg total) by mouth daily.  [name] COVID-19 mRNA Vac-TriS, Pfizer, (PFIZER-BIONT COVID-19 VAC-TRIS) SUSP injection Inject into the muscle.  . cyanocobalamin 1000 MCG tablet Take 1,000 mcg by mouth daily with supper.  . Dulaglutide (TRULICITY) 1.5 MG/0.5ML SOPN Inject 1.5 mg into the skin every Friday.  [name] FARXIGA 10 MG TABS tablet Take 10 mg by mouth daily.  [name] ketorolac (ACULAR) 0.5 % ophthalmic solution Place 1 drop into both eyes 4 (four)  times daily.  [name] LANTUS SOLOSTAR 100 UNIT/ML Solostar Pen Inject 8 Units into the skin at bedtime.  [name] latanoprost (XALATAN) 0.005 % ophthalmic solution Place 1 drop into both eyes at bedtime.   [name] lisinopril (ZESTRIL) 10 MG tablet Take 10 mg by mouth daily.  [name] loratadine (CLARITIN) 10 MG tablet Take 10 mg by mouth daily.  . metFORMIN (GLUCOPHAGE-XR) 500  MG 24 hr tablet Take 1,000 mg by mouth 2 (two) times daily.  . Multiple Vitamin (MULTIVITAMIN WITH MINERALS) TABS tablet Take 1 tablet by mouth at bedtime.  . nitroGLYCERIN (NITROSTAT) 0.4 MG SL tablet Place 1 tablet (0.4 mg total) under the tongue every 5 (five) minutes as needed for chest pain.  Letta Pate VERIO test strip 3 (three) times daily.  . prednisoLONE acetate (PRED FORTE) 1 % ophthalmic suspension Place 2 drops into both eyes in the morning and at bedtime.     Allergies  Allergen Reactions  . Penicillins Anaphylaxis    Swelling Has patient had a PCN reaction causing immediate rash, facial/tongue/throat swelling, SOB or lightheadedness with hypotension: YES Has patient had a PCN reaction causing severe rash involving mucus membranes or skin necrosis: NO Has patient had a PCN reaction that required hospitalization NO Has patient had a PCN reaction occurring within the last 10 years: NO If all of the above answers are "NO", then may proceed with Cephalosporin use.  . Clindamycin/Lincomycin Rash    Social History   Socioeconomic History  . Marital status: Single    Spouse name: Not on file  . Number of children: 0  . Years of education: Not on file  . Highest education level: Not on file  Occupational History  . Occupation: Retired  Tobacco Use  . Smoking status: Never Smoker  . Smokeless tobacco: Never Used  Vaping Use  . Vaping Use: Never used  Substance and Sexual Activity  . Alcohol use: No  . Drug use: No  . Sexual activity: Not Currently  Other Topics Concern  . Not on file  Social History Narrative  . Not on file   Social Determinants of Health   Financial Resource Strain: Not on file  Food Insecurity: Not on file  Transportation Needs: Not on file  Physical Activity: Not on file  Stress: Not on file  Social Connections: Not on file  Intimate Partner Violence: Not on file     Review of Systems: General: negative for chills, fever, night sweats or  weight changes.  Cardiovascular: negative for chest pain, dyspnea on exertion, edema, orthopnea, palpitations, paroxysmal nocturnal dyspnea or shortness of breath Dermatological: negative for rash Respiratory: negative for cough or wheezing Urologic: negative for hematuria Abdominal: negative for nausea, vomiting, diarrhea, bright red blood per rectum, melena, or hematemesis Neurologic: negative for visual changes, syncope, or dizziness All other systems reviewed and are otherwise negative except as noted above.    Blood pressure (!) 118/52, pulse 88, height 5\' 7"  (1.702 m), weight 255 lb (115.7 kg), last menstrual period 12/31/2012.  General appearance: alert and no distress Neck: no adenopathy, no carotid bruit, no JVD, supple, symmetrical, trachea midline and thyroid not enlarged, symmetric, no tenderness/mass/nodules Lungs: clear to auscultation bilaterally Heart: regular rate and rhythm, S1, S2 normal, no murmur, click, rub or gallop Extremities: extremities normal, atraumatic, no cyanosis or edema Pulses: 2+ and symmetric Skin: Skin color, texture, turgor normal. No rashes or lesions Neurologic: Alert and oriented X 3, normal strength and tone. Normal symmetric reflexes. Normal coordination and gait  EKG not performed today  ASSESSMENT AND PLAN:   Peripheral arterial disease (HCC) Ms. Gieger returns today for post hospital follow-up.  I performed right SFA directional atherectomy followed by drug-coated balloon and angioplasty and ultimately Innova stenting of a 95% segmental mid right SFA stenosis with two-vessel runoff on 06/01/2020 for lifestyle limiting right calf claudication.  Follow-up Dopplers revealed a widely patent right SFA with an ABI that has increased from 0.48 up to 0.94.  We will recheck lower extremity arterial Doppler studies in 6 months.      Runell Gess MD FACP,FACC,FAHA, [facility] 06/16/2020 3:00 PM

## 2020-06-23 ENCOUNTER — Encounter: Payer: Self-pay | Admitting: Podiatry

## 2020-06-23 ENCOUNTER — Other Ambulatory Visit: Payer: Self-pay

## 2020-06-23 ENCOUNTER — Ambulatory Visit (INDEPENDENT_AMBULATORY_CARE_PROVIDER_SITE_OTHER): Payer: Federal, State, Local not specified - PPO | Admitting: Podiatry

## 2020-06-23 DIAGNOSIS — I739 Peripheral vascular disease, unspecified: Secondary | ICD-10-CM

## 2020-06-23 DIAGNOSIS — G4733 Obstructive sleep apnea (adult) (pediatric): Secondary | ICD-10-CM | POA: Diagnosis not present

## 2020-06-23 DIAGNOSIS — L84 Corns and callosities: Secondary | ICD-10-CM | POA: Diagnosis not present

## 2020-06-23 DIAGNOSIS — Z794 Long term (current) use of insulin: Secondary | ICD-10-CM | POA: Insufficient documentation

## 2020-06-23 DIAGNOSIS — E1151 Type 2 diabetes mellitus with diabetic peripheral angiopathy without gangrene: Secondary | ICD-10-CM | POA: Diagnosis not present

## 2020-06-23 DIAGNOSIS — Z6841 Body Mass Index (BMI) 40.0 and over, adult: Secondary | ICD-10-CM | POA: Insufficient documentation

## 2020-06-23 DIAGNOSIS — E7800 Pure hypercholesterolemia, unspecified: Secondary | ICD-10-CM | POA: Insufficient documentation

## 2020-06-23 DIAGNOSIS — Z9989 Dependence on other enabling machines and devices: Secondary | ICD-10-CM | POA: Insufficient documentation

## 2020-06-26 NOTE — Progress Notes (Signed)
Subjective: 60 year old female presents the office today for diabetic foot evaluation.  She has calluses to her feet that show infection but denies any open lesions.  She states the nail on the second toe has come off but denies any swelling or redness or any drainage.  Last A1c was 7.1.  Objective: AAO x3, NAD DP/PT pulses palpable but decreased, CRT less than 3 seconds Hyperkeratotic lesion bilateral submetatarsal 5 without any underlying ulceration drainage or any signs of infection.  There is no open lesion identified today.  There is no open lesion identified today.  The knee on the second toe has come off it started to grow back and.  No pain in the nails there is no redness or drainage or any signs of infection. No pain with calf compression, swelling, warmth, erythema  Assessment: Preulcerative callus, type 2 diabetes  Plan: -All treatment options discussed with the patient including all alternatives, risks, complications.  -Hyperkeratotic lesion sharply debrided x2 without any complications or bleeding.  Continue with the inserts which appear to be fitting well. -She has followed up with Dr. Allyson Sabal in regards to her circulation. Currently without skin breakdown.  -Monitoring skin breakdown.  Discussed daily foot inspection.  Vivi Barrack DPM

## 2020-07-14 DIAGNOSIS — E119 Type 2 diabetes mellitus without complications: Secondary | ICD-10-CM | POA: Diagnosis not present

## 2020-07-14 DIAGNOSIS — Z794 Long term (current) use of insulin: Secondary | ICD-10-CM | POA: Diagnosis not present

## 2020-07-14 DIAGNOSIS — H3581 Retinal edema: Secondary | ICD-10-CM | POA: Diagnosis not present

## 2020-07-14 DIAGNOSIS — Z961 Presence of intraocular lens: Secondary | ICD-10-CM | POA: Diagnosis not present

## 2020-07-14 DIAGNOSIS — H209 Unspecified iridocyclitis: Secondary | ICD-10-CM | POA: Diagnosis not present

## 2020-07-22 DIAGNOSIS — H209 Unspecified iridocyclitis: Secondary | ICD-10-CM | POA: Diagnosis not present

## 2020-07-23 DIAGNOSIS — R109 Unspecified abdominal pain: Secondary | ICD-10-CM | POA: Diagnosis not present

## 2020-08-06 ENCOUNTER — Other Ambulatory Visit: Payer: Self-pay | Admitting: Cardiovascular Disease

## 2020-08-06 ENCOUNTER — Other Ambulatory Visit: Payer: Self-pay | Admitting: Cardiology

## 2020-08-06 DIAGNOSIS — E782 Mixed hyperlipidemia: Secondary | ICD-10-CM

## 2020-08-11 DIAGNOSIS — Z79899 Other long term (current) drug therapy: Secondary | ICD-10-CM | POA: Diagnosis not present

## 2020-08-11 DIAGNOSIS — H209 Unspecified iridocyclitis: Secondary | ICD-10-CM | POA: Diagnosis not present

## 2020-08-11 DIAGNOSIS — Z961 Presence of intraocular lens: Secondary | ICD-10-CM | POA: Diagnosis not present

## 2020-08-11 DIAGNOSIS — E119 Type 2 diabetes mellitus without complications: Secondary | ICD-10-CM | POA: Diagnosis not present

## 2020-08-18 DIAGNOSIS — Z20822 Contact with and (suspected) exposure to covid-19: Secondary | ICD-10-CM | POA: Diagnosis not present

## 2020-08-28 DIAGNOSIS — G4733 Obstructive sleep apnea (adult) (pediatric): Secondary | ICD-10-CM | POA: Diagnosis not present

## 2020-08-28 DIAGNOSIS — I2510 Atherosclerotic heart disease of native coronary artery without angina pectoris: Secondary | ICD-10-CM | POA: Diagnosis not present

## 2020-08-28 DIAGNOSIS — R1084 Generalized abdominal pain: Secondary | ICD-10-CM | POA: Diagnosis not present

## 2020-09-02 ENCOUNTER — Encounter: Payer: Self-pay | Admitting: Physician Assistant

## 2020-09-02 ENCOUNTER — Ambulatory Visit (INDEPENDENT_AMBULATORY_CARE_PROVIDER_SITE_OTHER): Payer: Federal, State, Local not specified - PPO | Admitting: Physician Assistant

## 2020-09-02 ENCOUNTER — Telehealth: Payer: Self-pay | Admitting: *Deleted

## 2020-09-02 VITALS — BP 122/78 | HR 84 | Ht 66.0 in | Wt 263.4 lb

## 2020-09-02 DIAGNOSIS — R194 Change in bowel habit: Secondary | ICD-10-CM

## 2020-09-02 DIAGNOSIS — Z860100 Personal history of colon polyps, unspecified: Secondary | ICD-10-CM

## 2020-09-02 DIAGNOSIS — R109 Unspecified abdominal pain: Secondary | ICD-10-CM

## 2020-09-02 DIAGNOSIS — R6881 Early satiety: Secondary | ICD-10-CM

## 2020-09-02 MED ORDER — NA SULFATE-K SULFATE-MG SULF 17.5-3.13-1.6 GM/177ML PO SOLN: 1.0000 | Freq: Once | ORAL | 0 refills | Status: AC

## 2020-09-02 NOTE — Telephone Encounter (Signed)
Please refer to message below.

## 2020-09-02 NOTE — Telephone Encounter (Signed)
[facility] Pre-operative Risk Assessment     Request for surgical clearance:     Endoscopy Procedure  What type of surgery is being performed?     Colonoscopy  When is this surgery scheduled?     10/14/20  What type of clearance is required ?   Pharmacy  Are there any medications that need to be held prior to surgery and how long? Plavix 5 days  Practice name and name of physician performing surgery?      [facility]  What is your office phone and fax number?      Phone- [phone]  [phone]  Anesthesia type (None, local, MAC, general) ?       MAC

## 2020-09-02 NOTE — Telephone Encounter (Signed)
Spoke with [facility] and made them aware of note below that colonoscopy need to be rescheduled after 12/02/20.

## 2020-09-02 NOTE — Progress Notes (Addendum)
Chief Complaint: Follow-up change in bowel habit  HPI:    Monica Daniels is a 60 year old African-American female with a past medical history of diabetes and status post stent on Plavix, assigned to Dr. Christella HartiganJacobs at last visit, who returns to clinic today for follow-up of change in bowel habits.     11/26/2019 CT abdomen pelvis without contrast with no urolithiasis, diffuse hepatic steatosis, moderate colonic stool volume suggesting constipation, coronary atherosclerosis and aortic atherosclerosis.    02/20/2020 patient seen by PCP for well visit.  At that time describes no abdominal pain, there is some question about relation to gastroparesis versus constipation versus food intolerance.  Labs at that time showed a normal CMP and CBC.    04/01/2020 patient seen in clinic and described her last colon[facility] in 2018 with polyps and recommendations to repeat in 5 years.  At that time described occasional severe abdominal cramps so severe she had to pull off the road if she was driving.  They have been becoming more frequent.  Also reported some bloating and one episode of rectal bleeding at that time discussed possible EGD and colonoscopy for further evaluation but she wanted to hold off on this and try conservative measures.  Recommended she start align probiotic and discussed the low FODMAP diet.  Also prescribed hyoscyamine sulfate every 4-6 hours for abdominal cramping.    07/14/2020 CBC with a hemoglobin minimally decreased at 12.2, hemoglobin A1c 8, hepatitis panel negative, CMP normal.    Today, the patient tells me that in general she is feeling much better.  The throbbing pain that she was experiencing previously is mostly gone or at least 90% better, but can still come on at times and is typically still on the left side of her abdomen.  It often does not last as long.  She has noticed that certain foods seem to cause this after doing the low FODMAP diet and so she really has decreased  her intake of onions and a lot of carbs at one time as well as big meals.  Started Metamucil and Align which have helped her to have more soft solid stools sometimes 1-2 a day.    Also tells me that she had her Trulicity stopped because she was never feeling like she was hungry and now she can actually feel hunger.    Reminds me of her history of polyps and thinks she is due for colonoscopy.    Denies fever, chills, blood in her stool or weight loss.  Past Medical History:  Diagnosis Date   Diabetes mellitus without complication (HCC)    Hypertension     Past Surgical History:  Procedure Laterality Date   ABDOMINAL AORTOGRAM W/LOWER EXTREMITY N/A 06/01/2020   Procedure: ABDOMINAL AORTOGRAM W/LOWER EXTREMITY;  Surgeon: Runell GessBerry, Jonathan J, MD;  Location: [facility] IN[facility];  Service: Cardiovascular;  Laterality: N/A;   CARDIAC CATHETERIZATION     CORONARY ATHERECTOMY N/A 08/15/2019   Procedure: CORONARY ATHERECTOMY;  Surgeon: Runell GessBerry, Jonathan J, MD;  [facility];  Service: Cardiovascular;  Laterality: N/A;   CORONARY STENT INTERVENTION N/A 08/15/2019   Procedure: CORONARY STENT INTERVENTION;  Surgeon: Runell GessBerry, Jonathan J, MD;  Location: [facility] IN[facility]ASIVE CV [facility];  Service: Cardiovascular;  Laterality: N/A;   HAND TENDON SURGERY     LEFT HEART CATH AND CORONARY ANGIOGRAPHY N/A 08/12/2019   Procedure: LEFT HEART CATH AND CORONARY ANGIOGRAPHY;  Surgeon: Runell GessBerry, Jonathan J, MD;  Location: [facility];  Service: Cardiovascular;  Laterality: N/A;   PERIPHERAL VASCULAR ATHERECTOMY  06/01/2020   Procedure: PERIPHERAL VASCULAR ATHERECTOMY;  Surgeon: Runell Gess, MD;  Location: [facility] INVASIVE CV LAB;  Service: Cardiovascular;;  right SFA   PERIPHERAL VASCULAR INTERVENTION  06/01/2020   Procedure: PERIPHERAL VASCULAR INTERVENTION;  Surgeon: Runell Gess, MD;  Location: [facility];  Service: Cardiovascular;;  rt SFA    Current Outpatient Medications  Medication Sig Dispense Refill    aspirin 81 MG EC tablet TAKE 1 TABLET BY MOUTH DAILY. SWALLOW WHOLE. (Patient taking differently: Take 81 mg by mouth daily.) 30 tablet 12   atorvastatin (LIPITOR) 80 MG tablet Take 1 tablet (80 mg total) by mouth at bedtime. 90 tablet 3   B-D UF III MINI PEN NEEDLES 31G X 5 MM MISC USE TO INJECT LANTUS DAILY AS DIRECTED     clopidogrel (PLAVIX) 75 MG tablet TAKE 1 TABLET BY MOUTH EVERY DAY 90 tablet 3   COVID-19 mRNA Vac-TriS, Pfizer, (PFIZER-BIONT COVID-19 VAC-TRIS) SUSP injection Inject into the muscle. 0.3 mL 0   cyanocobalamin 1000 MCG tablet Take 1,000 mcg by mouth daily with supper.     Dulaglutide (TRULICITY) 1.5 MG/0.5ML SOPN Inject 1.5 mg into the skin every Friday.     ezetimibe (ZETIA) 10 MG tablet Take 1 tablet (10 mg total) by mouth daily. (Patient taking differently: Take 10 mg by mouth at bedtime.) 90 tablet 3   FARXIGA 10 MG TABS tablet Take 10 mg by mouth daily.     ketorolac (ACULAR) 0.5 % ophthalmic solution Place 1 drop into both eyes 4 (four) times daily.     LANTUS SOLOSTAR 100 UNIT/ML Solostar Pen Inject 8 Units into the skin at bedtime.     latanoprost (XALATAN) 0.005 % ophthalmic solution Place 1 drop into both eyes at bedtime.      lisinopril (ZESTRIL) 10 MG tablet Take 10 mg by mouth daily.     loratadine (CLARITIN) 10 MG tablet Take 10 mg by mouth daily.     metFORMIN (GLUCOPHAGE-XR) 500 MG 24 hr tablet Take 1,000 mg by mouth 2 (two) times daily.     Multiple Vitamin (MULTIVITAMIN WITH MINERALS) TABS tablet Take 1 tablet by mouth at bedtime.     nitroGLYCERIN (NITROSTAT) 0.4 MG SL tablet Place 1 tablet (0.4 mg total) under the tongue every 5 (five) minutes as needed for chest pain. 25 tablet 2   ONETOUCH VERIO test strip 3 (three) times daily.     prednisoLONE acetate (PRED FORTE) 1 % ophthalmic suspension Place 2 drops into both eyes in the morning and at bedtime.     No current facility-administered medications for this visit.    Allergies as of 09/02/2020 -  Review Complete 06/23/2020  Allergen Reaction Noted   Penicillins Anaphylaxis 02/21/2014   Clindamycin/lincomycin Rash 02/22/2014    Family History  Problem Relation Age of Onset   Colon cancer Neg Hx    Stomach cancer Neg Hx    Pancreatic cancer Neg Hx    Esophageal cancer Neg Hx    Liver disease Neg Hx     Social History   Socioeconomic History   Marital status: Single    Spouse name: Not on file   Number of children: 0   Years of education: Not on file   Highest education level: Not on file  Occupational History   Occupation: Retired  Tobacco Use   Smoking status: Never   Smokeless tobacco: Never  Vaping Use  Vaping Use: Never used  Substance and Sexual Activity   Alcohol use: No   Drug use: No   Sexual activity: Not Currently  Other Topics Concern   Not on file  Social History Narrative   Not on file   Social Determinants of Health   Financial Resource Strain: Not on file  Food Insecurity: Not on file  Transportation Needs: Not on file  Physical Activity: Not on file  Stress: Not on file  Social Connections: Not on file  Intimate Partner Violence: Not on file    Review of Systems:    Constitutional: No weight loss, fever or chills Cardiovascular: No chest pain Respiratory: No SOB  Gastrointestinal: See HPI and otherwise negative   Physical Exam:  Vital signs: BP 122/78   Pulse 84   Ht 5\' 6"  (1.676 m)   Wt 263 lb 6.4 oz (119.5 kg)   LMP 12/31/2012   SpO2 98%   BMI 42.51 kg/m   Constitutional:   Pleasant obese AA female appears to be in NAD, Well developed, Well nourished, alert and cooperative Respiratory: Respirations even and unlabored. Lungs clear to auscultation bilaterally.   No wheezes, crackles, or rhonchi.  Cardiovascular: Normal S1, S2. No MRG. Regular rate and rhythm. No peripheral edema, cyanosis or pallor.  Gastrointestinal:  Soft, nondistended, nontender. No rebound or guarding. Normal bowel sounds. No appreciable masses or  hepatomegaly. Rectal:  Not performed.  Psychiatric: Demonstrates good judgement and reason without abnormal affect or behaviors.  RELEVANT LABS AND IMAGING: CBC    Component Value Date/Time   WBC 6.0 05/27/2020 1123   WBC 5.2 08/15/2019 0737   RBC 4.90 05/27/2020 1123   RBC 4.61 08/15/2019 0737   HGB 13.7 05/27/2020 1123   HCT 41.2 05/27/2020 1123   PLT 317 05/27/2020 1123   MCV 84 05/27/2020 1123   MCH 28.0 05/27/2020 1123   MCH 28.0 08/15/2019 0737   MCHC 33.3 05/27/2020 1123   MCHC 32.0 08/15/2019 0737   RDW 13.1 05/27/2020 1123   LYMPHSABS 2.0 08/06/2019 0936   EOSABS 0.2 08/06/2019 0936   BASOSABS 0.1 08/06/2019 0936    CMP     Component Value Date/Time   NA 138 05/27/2020 1123   K 4.3 05/27/2020 1123   CL 100 05/27/2020 1123   CO2 23 05/27/2020 1123   GLUCOSE 149 (H) 05/27/2020 1123   GLUCOSE 152 (H) 08/15/2019 0737   BUN 17 05/27/2020 1123   CREATININE 0.89 05/27/2020 1123   CALCIUM 9.3 05/27/2020 1123   PROT 7.1 11/19/2019 0954   ALBUMIN 4.4 11/19/2019 0954   AST 18 11/19/2019 0954   ALT 18 11/19/2019 0954   ALKPHOS 76 11/19/2019 0954   BILITOT 0.5 11/19/2019 0954   GFRNONAA >60 08/15/2019 0737   GFRAA >60 08/15/2019 0737    Assessment: 1.  Left-sided abdominal pain: Was much worse months ago, now better with only "twinges" of pain, she would like further evaluation with colonoscopy, previous CT abdomen reviewed with not etiology; likely IBS 2.  Change in bowel habits: Was radiating towards constipation/diarrhea but now more normal after trial of the low FODMAP diet and fiber with Align; likely IBS/diet/diabetes 3.  Early satiety: Better after stopping Trulicity; likely medication side effect 4.  History of colon polyps: On last colonoscopy which patient believes is in 2018, we are requesting records yet again  Plan: 1.  We will request last colonoscopy again from [facility]. 2.  [facility] ahead and scheduled patient for a diagnostic colonoscopy  given ongoing left-sided abdominal pain with Dr. Christella Hartigan in the [facility].  Did provide the patient with a detailed list of risks for the procedure and she agrees to proceed. 3.  Patient was advised to hold her Plavix for 5 days prior to time of procedure.  We will communicate with her cardiologist, Dr. Allyson Sabal to ensure that holding her Plavix is acceptable for her. 4.  Advised the patient to continue Align and Metamucil as this seems to be helping her 5.  Patient to follow in clinic per recommendations from Dr. Christella Hartigan after time of procedure.  Hyacinth Meeker, PA-C [facility] 09/02/2020, 8:58 AM  Cc: Shon Hale, *   Addendum: 09/14/2020 1536  Received records from last colonoscopy.  04/28/2016 colonoscopy done for colon cancer screening with a 2 mm polyp removed from the rectum.  Pathology showed a tubular adenoma and repeat was recommended in 5 years.  No change in plans.  Hyacinth Meeker, PA-C

## 2020-09-02 NOTE — Progress Notes (Signed)
I agree with the above note, plan 

## 2020-09-02 NOTE — Patient Instructions (Addendum)
Continue Metamucil and Align.   You will be contacted by our office prior to your procedure for directions on holding your Plavix.  If you do not hear from our office 1 week prior to your scheduled procedure, please call [phone] to discuss.   You have been scheduled for a colonoscopy. Please follow written instructions given to you at your visit today.  Please pick up your prep supplies at the pharmacy within the next 1-3 days. If you use inhalers (even only as needed), please bring them with you on the day of your procedure.  If you are age 62 or older, your body mass index should be between 23-30. Your Body mass index is 42.51 kg/m. If this is out of the aforementioned range listed, please consider follow up with your Primary Care Provider.  If you are age 60 or younger, your body mass index should be between 19-25. Your Body mass index is 42.51 kg/m. If this is out of the aformentioned range listed, please consider follow up with your Primary Care Provider.   __________________________________________________________  The [facility] providers would like to encourage you to use [facility] to communicate with providers for non-urgent requests or questions.  Due to long hold times on the telephone, sending your provider a message by [facility] may be a faster and more efficient way to get a response.  Please allow 48 business hours for a response.  Please remember that this is for non-urgent requests.

## 2020-09-02 NOTE — Telephone Encounter (Signed)
Monica Daniels 60 year old female is requesting preoperative cardiac evaluation for colonoscopy.  She was seen in the clinic on 06/16/2020 for follow-up evaluation of her PAD.  She underwent right SFA directional arthrectomy with angioplasty and Innova stenting of a 95% mid right SFA stenosis.  Her right calf claudication symptoms resolved.  Follow-up lower extremity Doppler showed a widely patent SFA with an ABI that had improved from 0.48-0.94.  Plan for repeat Dopplers in 6 months was made.  How long will she need to continue her DAPT before she is able to hold her Plavix for colonoscopy?  Thank you for your help.  Please direct response to CV DIV preop pool.  Thomasene Ripple. Sylvester Salonga NP-C    09/02/2020, 10:26 AM [facility] [address] Office [phone] Fax [phone]

## 2020-09-02 NOTE — Telephone Encounter (Signed)
Monica Daniels underwent right SFA directional arthrectomy with angioplasty and Innova stenting of a 95% mid right SFA stenosis.  The stenting and procedure was performed on 06/01/2020.  She will not be eligible for holding her dual antiplatelet therapy until after 12/02/2020.  An interruption in her dual antiplatelet therapy could compromise her recent stenting.  Preoperative team please contact requesting office and let them know that the patient's colonoscopy will need to be rescheduled until after 12/02/2020.    Thank you  Thomasene Ripple. Rasheen Schewe NP-C    09/02/2020, 3:53 PM [facility] [address] Office [phone] Fax [phone]

## 2020-09-04 NOTE — Telephone Encounter (Signed)
Spoke with patient and informed her unable to due colonoscopy at this time per Dr. Allyson Sabal as she cannot stop Plavix. Patient will call back to reschedule after seeing Dr. Allyson Sabal in [place].

## 2020-09-11 ENCOUNTER — Other Ambulatory Visit: Payer: Self-pay | Admitting: Cardiovascular Disease

## 2020-09-24 ENCOUNTER — Other Ambulatory Visit: Payer: Self-pay

## 2020-09-24 ENCOUNTER — Ambulatory Visit (INDEPENDENT_AMBULATORY_CARE_PROVIDER_SITE_OTHER): Payer: Federal, State, Local not specified - PPO | Admitting: Podiatry

## 2020-09-24 DIAGNOSIS — E1151 Type 2 diabetes mellitus with diabetic peripheral angiopathy without gangrene: Secondary | ICD-10-CM

## 2020-09-24 DIAGNOSIS — L84 Corns and callosities: Secondary | ICD-10-CM

## 2020-09-24 NOTE — Patient Instructions (Signed)

## 2020-09-24 NOTE — Progress Notes (Signed)
Subjective: 60 year old female presents the office today for diabetic foot evaluation.  States that she has been doing well.  She is noticed a callus on her right foot but is doing better.  Is been trying to keep more moisturizer on it.  No open sores that she reports.  Last A1c was 8 on July 14, 2020   Objective: AAO x3, NAD DP/PT pulses palpable bilaterally, CRT less than 3 seconds Mild hyperkeratotic tissue right submetatarsal 5 worse than left.  No ongoing ulceration drainage or signs of infection.  Minimal hyperkeratotic tissue on the fifth toes bilaterally without any ulceration.  No open lesions bilaterally.  There is a varus of lesser digits No pain with calf compression, swelling, warmth, erythema  Assessment: Hyperkeratotic lesions, uncontrolled type 2 diabetes  Plan: -All treatment options discussed with the patient including all alternatives, risks, complications.  -Sharp debrided hyperkeratotic lesions x4 without any complications or bleeding.  There is no ulcerations but discussed daily foot inspection.  Discussed shoe modifications to avoid pressure. -Patient encouraged to call the office with any questions, concerns, change in symptoms.   Vivi Barrack DPM

## 2020-10-14 ENCOUNTER — Encounter: Payer: Self-pay | Admitting: Gastroenterology

## 2020-10-14 DIAGNOSIS — H209 Unspecified iridocyclitis: Secondary | ICD-10-CM | POA: Diagnosis not present

## 2020-10-14 DIAGNOSIS — Z79899 Other long term (current) drug therapy: Secondary | ICD-10-CM | POA: Diagnosis not present

## 2020-10-20 DIAGNOSIS — Z961 Presence of intraocular lens: Secondary | ICD-10-CM | POA: Diagnosis not present

## 2020-10-20 DIAGNOSIS — H209 Unspecified iridocyclitis: Secondary | ICD-10-CM | POA: Diagnosis not present

## 2020-10-20 DIAGNOSIS — E119 Type 2 diabetes mellitus without complications: Secondary | ICD-10-CM | POA: Diagnosis not present

## 2020-10-20 DIAGNOSIS — Z794 Long term (current) use of insulin: Secondary | ICD-10-CM | POA: Diagnosis not present

## 2020-10-20 DIAGNOSIS — Z79899 Other long term (current) drug therapy: Secondary | ICD-10-CM | POA: Diagnosis not present

## 2020-11-04 DIAGNOSIS — E119 Type 2 diabetes mellitus without complications: Secondary | ICD-10-CM | POA: Diagnosis not present

## 2020-11-04 DIAGNOSIS — E1165 Type 2 diabetes mellitus with hyperglycemia: Secondary | ICD-10-CM | POA: Diagnosis not present

## 2020-11-04 DIAGNOSIS — Z7984 Long term (current) use of oral hypoglycemic drugs: Secondary | ICD-10-CM | POA: Diagnosis not present

## 2020-11-04 DIAGNOSIS — Z794 Long term (current) use of insulin: Secondary | ICD-10-CM | POA: Diagnosis not present

## 2020-12-31 ENCOUNTER — Other Ambulatory Visit: Payer: Self-pay

## 2020-12-31 ENCOUNTER — Ambulatory Visit (HOSPITAL_COMMUNITY)
Admission: RE | Admit: 2020-12-31 | Discharge: 2020-12-31 | Disposition: A | Discharge status: Home / Self Care | Payer: Federal, State, Local not specified - PPO | Source: Ambulatory Visit | Attending: Cardiology | Admitting: Cardiology

## 2020-12-31 DIAGNOSIS — Z9862 Peripheral vascular angioplasty status: Secondary | ICD-10-CM | POA: Insufficient documentation

## 2021-01-01 ENCOUNTER — Encounter: Payer: Self-pay | Admitting: Cardiovascular Disease

## 2021-01-01 DIAGNOSIS — G4733 Obstructive sleep apnea (adult) (pediatric): Secondary | ICD-10-CM | POA: Diagnosis not present

## 2021-01-06 ENCOUNTER — Encounter: Payer: Self-pay | Admitting: Podiatry

## 2021-01-06 ENCOUNTER — Other Ambulatory Visit: Payer: Self-pay

## 2021-01-06 ENCOUNTER — Ambulatory Visit (INDEPENDENT_AMBULATORY_CARE_PROVIDER_SITE_OTHER): Payer: Federal, State, Local not specified - PPO | Admitting: Podiatry

## 2021-01-06 DIAGNOSIS — B351 Tinea unguium: Secondary | ICD-10-CM | POA: Diagnosis not present

## 2021-01-06 DIAGNOSIS — E1151 Type 2 diabetes mellitus with diabetic peripheral angiopathy without gangrene: Secondary | ICD-10-CM

## 2021-01-06 DIAGNOSIS — Z9862 Peripheral vascular angioplasty status: Secondary | ICD-10-CM

## 2021-01-06 DIAGNOSIS — M79609 Pain in unspecified limb: Secondary | ICD-10-CM

## 2021-01-06 NOTE — Progress Notes (Signed)
°  Subjective:  Patient ID: Monica Daniels, female    DOB: Aug 23, 1960,   MRN: [id number]  Chief Complaint  Patient presents with   Nail Problem    After trimming nails pt states she cut too deep at BL hallux BL medial borders and they started bleeding x 3 wks - no pain/redness/swelling -now it's scabbing tx: none -FBS: 90 a!C: 69.2    60 y.o. female presents for concern of knicking skin after attempting to trim her own nails about 3 weeks ago. She had bleeding. Denies pain redness or swelling. She has had scabbing and wanted her toes evaluated. She is diabetic and her last A1c was 8.2.. Denies any other pedal complaints. Denies n/v/f/c.   PCP: Garth Bigness MD   Past Medical History:  Diagnosis Date   Diabetes mellitus without complication (HCC)    Hypertension     Objective:  Physical Exam: Vascular: DP/PT pulses 2/4 bilateral. CFT <3 seconds. Normal hair growth on digits. No edema.  Skin. No lacerations or abrasions bilateral feet. Nails 1-5 are thickened discolored and elongated with subungual debris.  Bilateral hallux nails medial borders with some dried blood noted. No erythema edema or purulence noted. Borders appear to be healing well.  Musculoskeletal: MMT 5/5 bilateral lower extremities in DF, PF, Inversion and Eversion. Deceased ROM in DF of ankle joint.  Neurological: Sensation intact to light touch.   Assessment:   1. Type II diabetes mellitus with peripheral angiopathy (HCC)   2. Onychomycosis   3. Pain due to onychomycosis of nail      Plan:  Patient was evaluated and treated and all questions answered. -Discussed and educated patient on diabetic foot care, especially with  regards to the vascular, neurological and musculoskeletal systems.  -Stressed the importance of good glycemic control and the detriment of not  controlling glucose levels in relation to the foot. -Discussed supportive shoes at all times and checking feet regularly.  -Hallux nails appear well  healed. Advised to soak the nail on the left and apply neosporin and bandaid for next couple days. Recommend using a file and nothing sharp on her feel.  -Mechanically debrided all nails 1-5 bilateral using sterile nail nipper and filed with dremel without incident  -Answered all patient questions -Patient to return  in 3 months for at risk foot care -Patient advised to call the office if any problems or questions arise in the meantime.   Louann Sjogren, DPM

## 2021-01-12 DIAGNOSIS — Z79899 Other long term (current) drug therapy: Secondary | ICD-10-CM | POA: Diagnosis not present

## 2021-01-12 DIAGNOSIS — H209 Unspecified iridocyclitis: Secondary | ICD-10-CM | POA: Diagnosis not present

## 2021-01-12 DIAGNOSIS — E119 Type 2 diabetes mellitus without complications: Secondary | ICD-10-CM | POA: Diagnosis not present

## 2021-01-12 DIAGNOSIS — Z961 Presence of intraocular lens: Secondary | ICD-10-CM | POA: Diagnosis not present

## 2021-01-12 DIAGNOSIS — H35371 Puckering of macula, right eye: Secondary | ICD-10-CM | POA: Diagnosis not present

## 2021-01-12 DIAGNOSIS — H3581 Retinal edema: Secondary | ICD-10-CM | POA: Diagnosis not present

## 2021-02-05 DIAGNOSIS — E1165 Type 2 diabetes mellitus with hyperglycemia: Secondary | ICD-10-CM | POA: Diagnosis not present

## 2021-02-05 DIAGNOSIS — Z7984 Long term (current) use of oral hypoglycemic drugs: Secondary | ICD-10-CM | POA: Diagnosis not present

## 2021-02-05 DIAGNOSIS — Z794 Long term (current) use of insulin: Secondary | ICD-10-CM | POA: Diagnosis not present

## 2021-03-05 DIAGNOSIS — Z794 Long term (current) use of insulin: Secondary | ICD-10-CM | POA: Diagnosis not present

## 2021-03-05 DIAGNOSIS — E119 Type 2 diabetes mellitus without complications: Secondary | ICD-10-CM | POA: Diagnosis not present

## 2021-03-05 DIAGNOSIS — E1165 Type 2 diabetes mellitus with hyperglycemia: Secondary | ICD-10-CM | POA: Diagnosis not present

## 2021-03-24 ENCOUNTER — Ambulatory Visit: Payer: Federal, State, Local not specified - PPO | Admitting: Podiatry

## 2021-03-25 ENCOUNTER — Ambulatory Visit: Payer: Federal, State, Local not specified - PPO | Admitting: Podiatry

## 2021-04-13 DIAGNOSIS — H3581 Retinal edema: Secondary | ICD-10-CM | POA: Diagnosis not present

## 2021-04-13 DIAGNOSIS — Z961 Presence of intraocular lens: Secondary | ICD-10-CM | POA: Diagnosis not present

## 2021-04-13 DIAGNOSIS — H209 Unspecified iridocyclitis: Secondary | ICD-10-CM | POA: Diagnosis not present

## 2021-04-13 DIAGNOSIS — H35371 Puckering of macula, right eye: Secondary | ICD-10-CM | POA: Diagnosis not present

## 2021-04-13 DIAGNOSIS — E119 Type 2 diabetes mellitus without complications: Secondary | ICD-10-CM | POA: Diagnosis not present

## 2021-04-13 DIAGNOSIS — Z79899 Other long term (current) drug therapy: Secondary | ICD-10-CM | POA: Diagnosis not present

## 2021-04-27 ENCOUNTER — Ambulatory Visit (INDEPENDENT_AMBULATORY_CARE_PROVIDER_SITE_OTHER): Payer: Federal, State, Local not specified - PPO | Admitting: Podiatry

## 2021-04-27 DIAGNOSIS — B351 Tinea unguium: Secondary | ICD-10-CM | POA: Diagnosis not present

## 2021-04-27 DIAGNOSIS — E1151 Type 2 diabetes mellitus with diabetic peripheral angiopathy without gangrene: Secondary | ICD-10-CM

## 2021-04-27 DIAGNOSIS — E119 Type 2 diabetes mellitus without complications: Secondary | ICD-10-CM

## 2021-04-27 DIAGNOSIS — M79609 Pain in unspecified limb: Secondary | ICD-10-CM

## 2021-04-27 NOTE — Patient Instructions (Signed)
Apply a small amount of antibiotic ointment to the right toenail base. If you have any increase in swelling, redness, drainage or signs of infection let me know.  ? ?Diabetes Mellitus and Foot Care ?Foot care is an important part of your health, especially when you have diabetes. Diabetes may cause you to have problems because of poor blood flow (circulation) to your feet and legs, which can cause your skin to: ?Become thinner and drier. ?Break more easily. ?Heal more slowly. ?Peel and crack. ?You may also have nerve damage (neuropathy) in your legs and feet, causing decreased feeling in them. This means that you may not notice minor injuries to your feet that could lead to more serious problems. Noticing and addressing any potential problems early is the best way to prevent future foot problems. ?How to care for your feet ?Foot hygiene ? ?Wash your feet daily with warm water and mild soap. Do not use hot water. Then, pat your feet and the areas between your toes until they are completely dry. Do not soak your feet as this can dry your skin. ?Trim your toenails straight across. Do not dig under them or around the cuticle. File the edges of your nails with an emery board or nail file. ?Apply a moisturizing lotion or petroleum jelly to the skin on your feet and to dry, brittle toenails. Use lotion that does not contain alcohol and is unscented. Do not apply lotion between your toes. ?Shoes and socks ?Wear clean socks or stockings every day. Make sure they are not too tight. Do not wear knee-high stockings since they may decrease blood flow to your legs. ?Wear shoes that fit properly and have enough cushioning. Always look in your shoes before you put them on to be sure there are no objects inside. ?To break in new shoes, wear them for just a few hours a day. This prevents injuries on your feet. ?Wounds, scrapes, corns, and calluses ? ?Check your feet daily for blisters, cuts, bruises, sores, and redness. If you cannot  see the bottom of your feet, use a mirror or ask someone for help. ?Do not cut corns or calluses or try to remove them with medicine. ?If you find a minor scrape, cut, or break in the skin on your feet, keep it and the skin around it clean and dry. You may clean these areas with mild soap and water. Do not clean the area with peroxide, alcohol, or iodine. ?If you have a wound, scrape, corn, or callus on your foot, look at it several times a day to make sure it is healing and not infected. Check for: ?Redness, swelling, or pain. ?Fluid or blood. ?Warmth. ?Pus or a bad smell. ?General tips ?Do not cross your legs. This may decrease blood flow to your feet. ?Do not use heating pads or hot water bottles on your feet. They may burn your skin. If you have lost feeling in your feet or legs, you may not know this is happening until it is too late. ?Protect your feet from hot and cold by wearing shoes, such as at the beach or on hot pavement. ?Schedule a complete foot exam at least once a year (annually) or more often if you have foot problems. Report any cuts, sores, or bruises to your health care provider immediately. ?Where to find more information ?American Diabetes Association: www.diabetes.org ?Association of Diabetes Care & Education Specialists: www.diabeteseducator.org ?Contact a health care provider if: ?You have a medical condition that increases your risk  of infection and you have any cuts, sores, or bruises on your feet. ?You have an injury that is not healing. ?You have redness on your legs or feet. ?You feel burning or tingling in your legs or feet. ?You have pain or cramps in your legs and feet. ?Your legs or feet are numb. ?Your feet always feel cold. ?You have pain around any toenails. ?Get help right away if: ?You have a wound, scrape, corn, or callus on your foot and: ?You have pain, swelling, or redness that gets worse. ?You have fluid or blood coming from the wound, scrape, corn, or callus. ?Your wound,  scrape, corn, or callus feels warm to the touch. ?You have pus or a bad smell coming from the wound, scrape, corn, or callus. ?You have a fever. ?You have a red line going up your leg. ?Summary ?Check your feet every day for blisters, cuts, bruises, sores, and redness. ?Apply a moisturizing lotion or petroleum jelly to the skin on your feet and to dry, brittle toenails. ?Wear shoes that fit properly and have enough cushioning. ?If you have foot problems, report any cuts, sores, or bruises to your health care provider immediately. ?Schedule a complete foot exam at least once a year (annually) or more often if you have foot problems. ?This information is not intended to replace advice given to you by your health care provider. Make sure you discuss any questions you have with your health care provider. ?Document Revised: 08/01/2019 Document Reviewed: 08/01/2019 ?Elsevier Patient Education ? 2022 [employer] ? ?

## 2021-05-01 NOTE — Progress Notes (Signed)
Subjective: ?61 year old female presents the office today for diabetic foot evaluation.  She denies any ulcerations.  She states that she noticed some bleeding on her right big toenail about 2 weeks ago and she wants to make sure is not infected.  She states that she tried to pull back the skin around the nail and noticed some bleeding.  No purulence that she has noted.  Also asking the nails be trimmed as are thickened elongated she cannot do them herself.   ? ?Last A1c was 8.2 on February 05, 2021 last glucose was 102 she reports.   ? ?She is on Plavix.   ? ?Objective: ?AAO x3, NAD ?DP/PT pulses palpable bilaterally, CRT less than 3 seconds ?Nails are hypertrophic, dystrophic, brittle, discolored, elongated ?10. No surrounding redness or drainage. Tenderness nails 1-5 bilaterally.  There is some dried blood present around the right hallux nail.  There is no drainage or pus noted.  There is no significant erythema there is no edema. ?No open lesions. ?No pain with calf compression, swelling, warmth, erythema ? ?Assessment: ?Symptomatic onychomycosis, uncontrolled type 2 diabetes ? ?Plan: ?-All treatment options discussed with the patient including all alternatives, risks, complications.  ?-Sharp debride the nails x 10 without any complications or bleeding.  Recommend her not to pull the skin back or try to cut the nails or self given her history of being on Plavix and with elevated A1c.  For now recommended a apply a small amount of antibiotic ointment on the nail.  Monitor closely for signs or symptoms of infection.  Should anything change to let me know immediately. ?-Daily foot inspection, glucose control. ? ?Vivi Barrack DPM ? ?

## 2021-05-19 DIAGNOSIS — Z794 Long term (current) use of insulin: Secondary | ICD-10-CM | POA: Diagnosis not present

## 2021-05-19 DIAGNOSIS — Z79899 Other long term (current) drug therapy: Secondary | ICD-10-CM | POA: Diagnosis not present

## 2021-05-19 DIAGNOSIS — Z7982 Long term (current) use of aspirin: Secondary | ICD-10-CM | POA: Diagnosis not present

## 2021-05-19 DIAGNOSIS — E119 Type 2 diabetes mellitus without complications: Secondary | ICD-10-CM | POA: Diagnosis not present

## 2021-05-19 DIAGNOSIS — E1142 Type 2 diabetes mellitus with diabetic polyneuropathy: Secondary | ICD-10-CM | POA: Diagnosis not present

## 2021-05-19 DIAGNOSIS — Z7969 Long term (current) use of other immunomodulators and immunosuppressants: Secondary | ICD-10-CM | POA: Diagnosis not present

## 2021-05-24 DIAGNOSIS — E7800 Pure hypercholesterolemia, unspecified: Secondary | ICD-10-CM | POA: Diagnosis not present

## 2021-05-24 DIAGNOSIS — Z0000 Encounter for general adult medical examination without abnormal findings: Secondary | ICD-10-CM | POA: Diagnosis not present

## 2021-05-24 DIAGNOSIS — E1142 Type 2 diabetes mellitus with diabetic polyneuropathy: Secondary | ICD-10-CM | POA: Diagnosis not present

## 2021-05-24 DIAGNOSIS — I10 Essential (primary) hypertension: Secondary | ICD-10-CM | POA: Diagnosis not present

## 2021-05-24 DIAGNOSIS — R310 Gross hematuria: Secondary | ICD-10-CM | POA: Diagnosis not present

## 2021-06-08 DIAGNOSIS — M26609 Unspecified temporomandibular joint disorder, unspecified side: Secondary | ICD-10-CM | POA: Diagnosis not present

## 2021-06-11 DIAGNOSIS — Z1231 Encounter for screening mammogram for malignant neoplasm of breast: Secondary | ICD-10-CM | POA: Diagnosis not present

## 2021-06-11 DIAGNOSIS — R928 Other abnormal and inconclusive findings on diagnostic imaging of breast: Secondary | ICD-10-CM | POA: Diagnosis not present

## 2021-06-22 DIAGNOSIS — R928 Other abnormal and inconclusive findings on diagnostic imaging of breast: Secondary | ICD-10-CM | POA: Diagnosis not present

## 2021-06-22 DIAGNOSIS — R921 Mammographic calcification found on diagnostic imaging of breast: Secondary | ICD-10-CM | POA: Diagnosis not present

## 2021-06-23 DIAGNOSIS — R921 Mammographic calcification found on diagnostic imaging of breast: Secondary | ICD-10-CM | POA: Diagnosis not present

## 2021-06-23 DIAGNOSIS — N6489 Other specified disorders of breast: Secondary | ICD-10-CM | POA: Diagnosis not present

## 2021-06-23 DIAGNOSIS — N6021 Fibroadenosis of right breast: Secondary | ICD-10-CM | POA: Diagnosis not present

## 2021-06-23 DIAGNOSIS — D241 Benign neoplasm of right breast: Secondary | ICD-10-CM | POA: Diagnosis not present

## 2021-06-28 DIAGNOSIS — G4733 Obstructive sleep apnea (adult) (pediatric): Secondary | ICD-10-CM | POA: Diagnosis not present

## 2021-06-28 DIAGNOSIS — E1142 Type 2 diabetes mellitus with diabetic polyneuropathy: Secondary | ICD-10-CM | POA: Diagnosis not present

## 2021-06-28 DIAGNOSIS — I10 Essential (primary) hypertension: Secondary | ICD-10-CM | POA: Diagnosis not present

## 2021-06-30 ENCOUNTER — Other Ambulatory Visit: Payer: Self-pay | Admitting: Cardiovascular Disease

## 2021-06-30 DIAGNOSIS — E782 Mixed hyperlipidemia: Secondary | ICD-10-CM

## 2021-07-09 ENCOUNTER — Other Ambulatory Visit: Payer: Self-pay | Admitting: Cardiovascular Disease

## 2021-07-12 ENCOUNTER — Encounter: Payer: Self-pay | Admitting: Student

## 2021-07-12 NOTE — Progress Notes (Addendum)
Cardiology Office Note:    Date:  07/20/2021   ID:  Monica Daniels, DOB 11/25/1960, MRN [id number]  PCP:  Shon Hale, MD  Cardiologist:  [name], MD  Electrophysiologist:  None   Referring MD: Shon Hale, *   Chief Complaint: routine follow-up of CAD  History of Present Illness:    Monica Daniels is a 61 y.o. female with a history of CAD s/p overlapping DES x2 to proximal RCA and orbital atherectomy and DES x2 to proximal and mid LAD in 07/2019,  PAD s/p atherectomy and stenting of right SFA in 05/2020, hypertension, hyperlipidemia, type 2 diabetes mellitus, obstructive sleep apnea on CPAP, and obesity who is followed by Dr. Allyson Sabal and presents today for routine follow-up.  Patient is primarily followed by Dr. Allyson Sabal for CAD and PAD. Coronary CTA was ordered in 07/2019 for further evaluation of chest pain and was suggestive of at least 2 vessel disease. Therefore, outpatient cardiac was performed on 08/12/2019 and confirmed severe 2 vessel disease of the RCA and LAD. He was brought back to the cath lab on 08/15/2019 for staged PCI of these vessels and underwent successful PCI with overlapping DES x2 to proximal tandem RCA lesions and orbital atherectomy and DES x2 of proximal and mid LAD lesions. Patient then developed lifestyle limiting claudication, especially while participating in cardiac rehab. Lower extremity dopplers in 02/2020 showed right ABI fo 0.48 and left ABI of 0.84 with a high-frequency signal of the mid right SFA. She underwent peripheral angiography in 05/2020 which showed 95% stenosis of mid right SFA. Dr. Allyson Sabal performed a successful directional atherectomy, drug-coated balloon angioplasty, and a nova self-expanding stent with excellent results. Her ABI increased from 0.48 to 0.94 and her claudication resolved. Last lower extremity dopplers in 12/2020 showed 50-99% restenosis of the proximal segment of the SFA stent and 30-49% stenosis of the common femoral artery.  Right ABI was 0.76 and left ABI was 0.85. Repeat studies were recommended in 1 year.  Patient presents today for follow-up. Here alone. Patient is doing well from a cardiac standpoint. She denies any chest pain, shortness of breath, orthopnea, PND, lower extremity edema, palpitations, lightheadedness, dizziness, syncope. No claudication or lower extremity wounds that won't heal. She has some digestive issues and will likely need a colonoscopy sometime this soon. She denies any abnormal bleeding on DAPT. She is able to complete 4 METS of physical activity without any anginal symptoms.  Reviewed Most Recent Labs from [facility] from 05/24/2021: - Lipid panel: Total Cholesterol 141, Triglycerides 68, HDL 59, LDL 68. - Creatinine 0.95, K 4.2 - Hemoglobin 12.9  Past Medical History:  Diagnosis Date   CAD (coronary artery disease)    s/p overlapping DES x2 to proximal RCA and orbital atherectomy and DES x2 to proximal and mid LAD in 07/2019   Diabetes mellitus without complication (HCC)    Hyperlipidemia    Hypertension    PAD (peripheral artery disease) (HCC)    s/p atherectomy and stenting of right SFA in 05/2020    Past Surgical History:  Procedure Laterality Date   ABDOMINAL AORTOGRAM W/LOWER EXTREMITY N/A 06/01/2020   Procedure: ABDOMINAL AORTOGRAM W/LOWER EXTREMITY;  Surgeon: Runell Gess, MD;  Location: [facility] INVASIVE CV LAB;  Service: Cardiovascular;  Laterality: N/A;   CARDIAC CATHETERIZATION     CORONARY ATHERECTOMY N/A 08/15/2019   Procedure: CORONARY ATHERECTOMY;  Surgeon: Runell Gess, MD;  Location: [facility] INVASIVE CV LAB;  Service: Cardiovascular;  Laterality: N/A;   CORONARY STENT  INTERVENTION N/A 08/15/2019   Procedure: CORONARY STENT INTERVENTION;  Surgeon: Runell GessBerry, Jonathan J, MD;  [facility];  Service: Cardiovascular;  Laterality: N/A;   HAND TENDON SURGERY     LEFT HEART CATH AND CORONARY ANGIOGRAPHY N/A 08/12/2019   Procedure: LEFT HEART CATH AND CORONARY  ANGIOGRAPHY;  Surgeon: Runell GessBerry, Jonathan J, MD;  Location: [facility] INVASIVE CV LAB;  Service: Cardiovascular;  Laterality: N/A;   PERIPHERAL VASCULAR ATHERECTOMY  06/01/2020   Procedure: PERIPHERAL VASCULAR ATHERECTOMY;  Surgeon: Runell GessBerry, Jonathan J, MD;  [facility];  Service: Cardiovascular;;  right SFA   PERIPHERAL VASCULAR INTERVENTION  06/01/2020   Procedure: PERIPHERAL VASCULAR INTERVENTION;  Surgeon: Runell GessBerry, Jonathan J, MD;  Location: [facility] INVASIVE CV LAB;  Service: Cardiovascular;;  rt SFA    Current Medications: Current Meds  Medication Sig   aspirin 81 MG EC tablet TAKE 1 TABLET BY MOUTH DAILY. SWALLOW WHOLE. (Patient taking differently: Take 81 mg by mouth daily.)   atorvastatin (LIPITOR) 80 MG tablet Take 1 tablet (80 mg total) by mouth at bedtime. SCHEDULE OFFICE VISIT FOR FUTURE REFILLS   B-D UF III MINI PEN NEEDLES 31G X 5 MM MISC USE TO INJECT LANTUS DAILY AS DIRECTED   clopidogrel (PLAVIX) 75 MG tablet Take 1 tablet (75 mg total) by mouth daily. SCHEDULE OFFICE VISIT FOR FUTURE REFILLS.   COVID-19 mRNA Vac-TriS, Pfizer, (PFIZER-BIONT COVID-19 VAC-TRIS) SUSP injection Inject into the muscle.   cyanocobalamin 1000 MCG tablet Take 1,000 mcg by mouth daily with supper.   ezetimibe (ZETIA) 10 MG tablet TAKE 1 TABLET BY MOUTH EVERY DAY   FARXIGA 10 MG TABS tablet Take 10 mg by mouth daily.   LANTUS SOLOSTAR 100 UNIT/ML Solostar Pen Inject 8 Units into the skin at bedtime.   latanoprost (XALATAN) 0.005 % ophthalmic solution Place 1 drop into both eyes at bedtime.   lisinopril (ZESTRIL) 10 MG tablet Take 10 mg by mouth daily.   loratadine (CLARITIN) 10 MG tablet Take 10 mg by mouth daily.   metFORMIN (GLUCOPHAGE-XR) 500 MG 24 hr tablet Take 1,000 mg by mouth 2 (two) times daily.   Multiple Vitamin (MULTIVITAMIN WITH MINERALS) TABS tablet Take 1 tablet by mouth at bedtime.   mycophenolate (CELLCEPT) 500 MG tablet Take by mouth.   ONETOUCH VERIO test strip 3 (three) times daily.      Allergies:   Penicillins and Clindamycin/lincomycin   Social History   Socioeconomic History   Marital status: Single    Spouse name: Not on file   Number of children: 0   Years of education: Not on file   Highest education level: Not on file  Occupational History   Occupation: Retired  Tobacco Use   Smoking status: Never   Smokeless tobacco: Never  Vaping Use   Vaping Use: Never used  Substance and Sexual Activity   Alcohol use: No   Drug use: No   Sexual activity: Not Currently  Other Topics Concern   Not on file  Social History Narrative   Not on file   Social Determinants of Health   Financial Resource Strain: Not on file  Food Insecurity: Not on file  Transportation Needs: Not on file  Physical Activity: Not on file  Stress: Not on file  Social Connections: Not on file     Family History: The patient's family history is negative for Colon cancer, Stomach cancer, Pancreatic cancer, Esophageal cancer, and Liver disease.  ROS:   Please see the history of present  illness.     EKGs/Labs/Other Studies Reviewed:    The following studies were reviewed today:  Left Cardiac Catheterization 08/15/2019: Prox RCA-1 lesion is 95% stenosed. Prox RCA-2 lesion is 95% stenosed. Prox LAD lesion is 95% stenosed. Prox LAD to Mid LAD lesion is 70% stenosed. Mid LAD lesion is 80% stenosed. A drug-eluting stent was successfully placed using a STENT RESOLUTE ONYX 2.75X26. Post intervention, there is a 0% residual stenosis. Post intervention, there is a 0% residual stenosis. A drug-eluting stent was successfully placed. Post intervention, there is a 0% residual stenosis. Post intervention, there is a 0% residual stenosis. A drug-eluting stent was successfully placed. Post intervention, there is a 0% residual stenosis.  Impressions: Successful PCI and drug-eluting stent of the proximal tandem RCA lesions using resolute Onyx drug-eluting stent postdilated to 3.1 mm.   Successful orbital atherectomy of a high-grade calcified proximal LAD lesion with subsequent stenting of the mid LAD and proximal LAD using 3 resolute Onyx drug-eluting stents.  Patient tolerated procedure well.  She was already on aspirin Plavix and did receive an additional 300 mg of p.o. Plavix.  She will be discharged home today as an outpatient and will arrange follow-up with me in 2 weeks.  Diagnostic Dominance: Right  Intervention   _______________  Peripheral Angiogram 06/01/2020: Final Impression: Successful right SFA directional atherectomy followed by drug-coated balloon angioplasty and ultimately nitinol self-expanding stenting using spider distal protection of a high-grade segmental mid right SFA stenosis for lifestyle limiting claudication.  The patient was already on aspirin and Plavix.  A Perclose device was successfully deployed.  Patient left lab in stable condition.  She will be discharged home early afternoon.  We will get lower extremity arterial Doppler studies in our [facility] line office next week and I will see her back the week after in follow-up. _______________  Lower Extremity Arterial Ultrasound/ ABIs 12/31/2020: Summary: Right: 30-49% stenosis noted in the common femoral artery. 50-99%  restenosis of the proximal segment of the SFA stent. Moderate progression is noted compared to previous study.   Suggest follow up study in 12 months.  Vascular consult recommended.   ABI Summary: Right: Resting right ankle-brachial index (0.76) indicates moderate right lower extremity arterial disease. The right toe-brachial index is abnormal.   Left: Resting left ankle-brachial index (0.85) indicates mild left lower  extremity arterial disease. The left toe-brachial index is normal.  EKG:  EKG ordered today. EKG personally reviewed and demonstrates normal sinus rhythm, rate 61 bpm, with low voltage QRS and non-specific T wave changes. QTc 392 ms.  Recent Labs: No results found  for requested labs within last 365 days.  Recent Lipid Panel    Component Value Date/Time   CHOL 125 11/19/2019 0954   TRIG 59 11/19/2019 0954   HDL 50 11/19/2019 0954   CHOLHDL 2.5 11/19/2019 0954   LDLCALC 62 11/19/2019 0954    Physical Exam:    Vital Signs: BP 124/66   Pulse 61   Ht 5\' 6"  (1.676 m)   Wt 253 lb 6.4 oz (114.9 kg)   LMP 12/31/2012   SpO2 99%   BMI 40.90 kg/m     Wt Readings from Last 3 Encounters:  07/20/21 253 lb 6.4 oz (114.9 kg)  09/02/20 263 lb 6.4 oz (119.5 kg)  06/16/20 255 lb (115.7 kg)     General: 61 y.o. obese African-American female in no acute distress. HEENT: Normocephalic and atraumatic. Sclera clear.  Neck: Supple. No carotid bruits. No JVD. Heart: RRR. Distinct  S1 and S2. No murmurs, gallops, or rubs. Radial pulses 2+ and equal bilaterally. Lungs: No increased work of breathing. Clear to ausculation bilaterally. No wheezes, rhonchi, or rales.  Abdomen: Soft, non-distended, and non-tender to palpation.  Extremities: No lower extremity edema.    Skin: Warm and dry. Neuro: Alert and oriented x3. No focal deficits. Psych: Normal affect. Responds appropriately.  Assessment:    1. Coronary artery disease involving native coronary artery of native heart without angina pectoris   2. PAD (peripheral artery disease) (HCC)   3. Primary hypertension   4. Hyperlipidemia, unspecified hyperlipidemia type   5. Type 2 diabetes mellitus with complication, with long-term current use of insulin (HCC)   6. Obstructive sleep apnea   7. Morbid obesity (HCC)   8. Pre-op evaluation     Plan:    CAD S/p DES to RCA and LAD in 07/2019. - No angina. - Continue DAPT with Aspirin and Plavix. - Continue high-intensity statin/Zetia.  Of note, patient was wondering how long she needs to be on Plavix. Given her degree of CAD and PAD, suspect she will be on this long-term. However, will confirm with Dr. Allyson Sabal.  PAD S/p atherectomy and stenting to mid right  SFA in 05/2020. Most recent lower extremity dopplers in 12/2020 showed 50-99% restenosis of the proximal segment of the SFA stent and 30-49% stenosis of the common femoral artery. ABIs were decreased from prior studies - 0.76 on the right and 0.85 on the left. - No claudication.  - Continue DAPT as above. - Continue high-intensity statin/Zetia. - Followed by Dr. Allyson Sabal. Plan is for repeat studies in 12/2021.  Hypertension BP well controlled.  - Continue Lisinopril 10mg  daily.  Hyperlipidemia Most recent lipid panel in 05/2021: Total Cholesterol 141, Triglycerides 68, HDL 59, LDL 68. - Continue Lipitor 80mg  daily and Zetia 10mg  daily.   Type 2 Diabetes Mellitus  Hemoglobin A1c 7.8 in 04/2021. - On Metformin, Trulicity, and Insulin. - Management per PCP.  Obstructive Sleep Apnea - Continue CPAP. Currently waiting on new CPAP machine.  Obesity BNP 40.9. She weighs 253 lbs today but is down 10 lbs from 08/2020. - Discussed importance of increasing physical activity.  Pre-Op Evaluation Patient has some digestive issues and will likely need a colonoscopy sometime soon. She is doing very well from a cardiac standpoint with no angina, shortness of breath, acute CHF symptoms, palpitations, syncope. She is able to complete >4.0 METS of activity. Therefore, she would be at acceptable risk for a colonoscopy without any addition cardiovascular testing.  Disposition: Follow up in 1 year.  ADDENDUM 07/28/2021: Discussed with Dr. 05/2021 who agreed that patient will need to remain on DAPT long-term.   Medication Adjustments/Labs and Tests Ordered: Current medicines are reviewed at length with the patient today.  Concerns regarding medicines are outlined above.  Orders Placed This Encounter  Procedures   EKG 12-Lead   Meds ordered this encounter  Medications   nitroGLYCERIN (NITROSTAT) 0.4 MG SL tablet    Sig: Place 1 tablet (0.4 mg total) under the tongue every 5 (five) minutes as needed for chest  pain.    Dispense:  25 tablet    Refill:  2    Patient Instructions  Medication Instructions:  No Changes *If you need a refill on your cardiac medications before your next appointment, please call your pharmacy*   Lab Work: No Labs If you have labs (blood work) drawn today and your tests are completely normal, you will receive your results  only by: [employer] (if you have MyChart) OR A paper copy in the mail If you have any lab test that is abnormal or we need to change your treatment, we will call you to review the results.   Testing/Procedures: No Testing   Follow-Up: At [facility], you and your health needs are our priority.  As part of our continuing mission to provide you with exceptional heart care, we have created designated Provider Care Teams.  These Care Teams include your primary Cardiologist (physician) and Advanced Practice Providers (APPs -  Physician Assistants and Nurse Practitioners) who all work together to provide you with the care you need, when you need it.  We recommend signing up for the patient portal called "MyChart".  Sign up information is provided on this After Visit Summary.  MyChart is used to connect with patients for Virtual Visits (Telemedicine).  Patients are able to view lab/test results, encounter notes, upcoming appointments, etc.  Non-urgent messages can be sent to your provider as well.   To learn more about what you can do with MyChart, go to [email or web address].    Your next appointment:   1 year(s)  The format for your next appointment:   In Person  Provider:   [name], MD or Marjie Skiff, PA-C      Important Information About Sugar         Signed, Smitty Knudsen  07/20/2021 9:51 AM    [facility]

## 2021-07-15 DIAGNOSIS — Z6841 Body Mass Index (BMI) 40.0 and over, adult: Secondary | ICD-10-CM | POA: Diagnosis not present

## 2021-07-15 DIAGNOSIS — Z03818 Encounter for observation for suspected exposure to other biological agents ruled out: Secondary | ICD-10-CM | POA: Diagnosis not present

## 2021-07-15 DIAGNOSIS — Z20822 Contact with and (suspected) exposure to covid-19: Secondary | ICD-10-CM | POA: Diagnosis not present

## 2021-07-19 ENCOUNTER — Encounter: Payer: Self-pay | Admitting: Student

## 2021-07-20 ENCOUNTER — Encounter: Payer: Self-pay | Admitting: Student

## 2021-07-20 ENCOUNTER — Ambulatory Visit: Payer: Federal, State, Local not specified - PPO | Admitting: Student

## 2021-07-20 VITALS — BP 124/66 | HR 61 | Ht 66.0 in | Wt 253.4 lb

## 2021-07-20 DIAGNOSIS — I739 Peripheral vascular disease, unspecified: Secondary | ICD-10-CM

## 2021-07-20 DIAGNOSIS — E118 Type 2 diabetes mellitus with unspecified complications: Secondary | ICD-10-CM

## 2021-07-20 DIAGNOSIS — I10 Essential (primary) hypertension: Secondary | ICD-10-CM

## 2021-07-20 DIAGNOSIS — G4733 Obstructive sleep apnea (adult) (pediatric): Secondary | ICD-10-CM

## 2021-07-20 DIAGNOSIS — I2510 Atherosclerotic heart disease of native coronary artery without angina pectoris: Secondary | ICD-10-CM

## 2021-07-20 DIAGNOSIS — Z794 Long term (current) use of insulin: Secondary | ICD-10-CM

## 2021-07-20 DIAGNOSIS — E785 Hyperlipidemia, unspecified: Secondary | ICD-10-CM

## 2021-07-20 DIAGNOSIS — Z01818 Encounter for other preprocedural examination: Secondary | ICD-10-CM

## 2021-07-20 MED ORDER — NITROGLYCERIN 0.4 MG SL SUBL: 0.4000 mg | SUBLINGUAL_TABLET | SUBLINGUAL | 2 refills | Status: DC | PRN

## 2021-08-02 ENCOUNTER — Ambulatory Visit: Payer: Federal, State, Local not specified - PPO | Admitting: Podiatry

## 2021-08-13 DIAGNOSIS — E119 Type 2 diabetes mellitus without complications: Secondary | ICD-10-CM | POA: Diagnosis not present

## 2021-08-13 DIAGNOSIS — Z961 Presence of intraocular lens: Secondary | ICD-10-CM | POA: Diagnosis not present

## 2021-08-13 DIAGNOSIS — H209 Unspecified iridocyclitis: Secondary | ICD-10-CM | POA: Diagnosis not present

## 2021-08-13 DIAGNOSIS — Z79899 Other long term (current) drug therapy: Secondary | ICD-10-CM | POA: Diagnosis not present

## 2021-08-13 DIAGNOSIS — H3581 Retinal edema: Secondary | ICD-10-CM | POA: Diagnosis not present

## 2021-08-21 ENCOUNTER — Other Ambulatory Visit: Payer: Self-pay | Admitting: Cardiovascular Disease

## 2021-08-21 DIAGNOSIS — E782 Mixed hyperlipidemia: Secondary | ICD-10-CM

## 2021-08-24 ENCOUNTER — Ambulatory Visit
Admission: EM | Admit: 2021-08-24 | Discharge: 2021-08-24 | Disposition: A | Discharge status: Home / Self Care | Payer: Federal, State, Local not specified - PPO | Attending: Physician Assistant | Admitting: Physician Assistant

## 2021-08-24 ENCOUNTER — Other Ambulatory Visit: Payer: Self-pay

## 2021-08-24 ENCOUNTER — Encounter: Payer: Self-pay | Admitting: Emergency Medicine

## 2021-08-24 DIAGNOSIS — H6981 Other specified disorders of Eustachian tube, right ear: Secondary | ICD-10-CM | POA: Insufficient documentation

## 2021-08-24 DIAGNOSIS — J029 Acute pharyngitis, unspecified: Secondary | ICD-10-CM | POA: Diagnosis not present

## 2021-08-24 LAB — POCT RAPID STREP A (OFFICE): Rapid Strep A Screen: NEGATIVE

## 2021-08-24 MED ORDER — FLUTICASONE PROPIONATE 50 MCG/ACT NA SUSP: 1.0000 | Freq: Every day | NASAL | 0 refills | Status: DC

## 2021-08-24 NOTE — ED Provider Notes (Signed)
[facility]    CSN: [id number] Arrival date & time: 08/24/21  0801      History   Chief Complaint Chief Complaint  Patient presents with   Sore Throat    HPI Monica Daniels is a 61 y.o. female.   Patient here today for evaluation of sore throat that is been ongoing for the last 3 days.  She reports that she has had some radiation of pain to her right ear at times.  She does report that it was improving but symptoms worsened this morning.  She has been trying to use salt water gargles and numbing spray without significant relief.  She denies any cough or runny nose.  She has not had fever.  She has no sick exposures.  The history is provided by the patient.  Sore Throat Pertinent negatives include no abdominal pain and no shortness of breath.    Past Medical History:  Diagnosis Date   CAD (coronary artery disease)    s/p overlapping DES x2 to proximal RCA and orbital atherectomy and DES x2 to proximal and mid LAD in 07/2019   Diabetes mellitus without complication (HCC)    Hyperlipidemia    Hypertension    PAD (peripheral artery disease) (HCC)    s/p atherectomy and stenting of right SFA in 05/2020    Patient Active Problem List   Diagnosis Date Noted   Body mass index (BMI) 40.0-44.9, adult (HCC) 06/23/2020   Dependence on other enabling machines and devices 06/23/2020   Long term (current) use of insulin (HCC) 06/23/2020   Obstructive sleep apnea 06/23/2020   Polyneuropathy due to type 2 diabetes mellitus (HCC) 03/18/2020   Myopia with regular astigmatism and presbyopia 09/20/2019   Coronary artery disease 08/27/2019   Chest pain of uncertain etiology 08/02/2019   Essential hypertension 06/07/2019   Peripheral arterial disease (HCC) 06/07/2019   Left carotid bruit 06/07/2019   Right hand pain 02/02/2016   HLD (hyperlipidemia) 06/25/2014   Non morbid obesity due to excess calories 06/25/2014   Type 2 diabetes mellitus without complication (HCC) 06/25/2014     Past Surgical History:  Procedure Laterality Date   ABDOMINAL AORTOGRAM W/LOWER EXTREMITY N/A 06/01/2020   Procedure: ABDOMINAL AORTOGRAM W/LOWER EXTREMITY;  Surgeon: Runell Gess, MD;  Location: [facility] INVASIVE CV LAB;  Service: Cardiovascular;  Laterality: N/A;   CARDIAC CATHETERIZATION     CORONARY ATHERECTOMY N/A 08/15/2019   Procedure: CORONARY ATHERECTOMY;  Surgeon: Runell Gess, MD;  Location: [facility] INVASIVE CV LAB;  Service: Cardiovascular;  Laterality: N/A;   CORONARY STENT INTERVENTION N/A 08/15/2019   Procedure: CORONARY STENT INTERVENTION;  Surgeon: Runell Gess, MD;  Location: [facility] INVASIVE CV LAB;  Service: Cardiovascular;  Laterality: N/A;   HAND TENDON SURGERY     LEFT HEART CATH AND CORONARY ANGIOGRAPHY N/A 08/12/2019   Procedure: LEFT HEART CATH AND CORONARY ANGIOGRAPHY;  Surgeon: Runell Gess, MD;  Location: [facility] INVASIVE CV LAB;  Service: Cardiovascular;  Laterality: N/A;   PERIPHERAL VASCULAR ATHERECTOMY  06/01/2020   Procedure: PERIPHERAL VASCULAR ATHERECTOMY;  Surgeon: Runell Gess, MD;  Location: [facility] INVASIVE CV LAB;  Service: Cardiovascular;;  right SFA   PERIPHERAL VASCULAR INTERVENTION  06/01/2020   Procedure: PERIPHERAL VASCULAR INTERVENTION;  Surgeon: Runell Gess, MD;  Location: [facility] INVASIVE CV LAB;  Service: Cardiovascular;;  rt SFA    OB History   No obstetric history on file.      Home Medications    Prior to Admission medications  Medication Sig Start Date End Date Taking? Authorizing Provider  fluticasone (FLONASE) 50 MCG/ACT nasal spray Place 1 spray into both nostrils daily for 14 days. 08/24/21 09/07/21 Yes Tomi Bamberger, PA-C  aspirin 81 MG EC tablet TAKE 1 TABLET BY MOUTH DAILY. SWALLOW WHOLE. Patient taking differently: Take 81 mg by mouth daily. 09/09/19   Runell Gess, MD  atorvastatin (LIPITOR) 80 MG tablet Take 1 tablet (80 mg total) by mouth at bedtime. SCHEDULE OFFICE VISIT FOR FUTURE REFILLS 07/01/21   Runell Gess,  MD  B-D UF III MINI PEN NEEDLES 31G X 5 MM MISC USE TO INJECT LANTUS DAILY AS DIRECTED 03/04/19   [provider]  clopidogrel (PLAVIX) 75 MG tablet Take 1 tablet (75 mg total) by mouth daily. SCHEDULE OFFICE VISIT FOR FUTURE REFILLS. 07/01/21   Runell Gess, MD  COVID-19 mRNA Vac-TriS, Pfizer, (PFIZER-BIONT COVID-19 VAC-TRIS) SUSP injection Inject into the muscle. 05/08/20   Judyann Munson, MD  cyanocobalamin 1000 MCG tablet Take 1,000 mcg by mouth daily with supper.    [provider]  ezetimibe (ZETIA) 10 MG tablet TAKE 1 TABLET BY MOUTH EVERY DAY 07/09/21   Runell Gess, MD  FARXIGA 10 MG TABS tablet Take 10 mg by mouth daily. 04/08/19   [provider]  LANTUS SOLOSTAR 100 UNIT/ML Solostar Pen Inject 8 Units into the skin at bedtime. 04/14/20   [provider]  latanoprost (XALATAN) 0.005 % ophthalmic solution Place 1 drop into both eyes at bedtime. 07/06/20   [provider]  lisinopril (ZESTRIL) 10 MG tablet Take 10 mg by mouth daily. 05/13/19   [provider]  loratadine (CLARITIN) 10 MG tablet Take 10 mg by mouth daily.    [provider]  metFORMIN (GLUCOPHAGE-XR) 500 MG 24 hr tablet Take 1,000 mg by mouth 2 (two) times daily. 09/17/19   [provider]  Multiple Vitamin (MULTIVITAMIN WITH MINERALS) TABS tablet Take 1 tablet by mouth at bedtime.    [provider]  mycophenolate (CELLCEPT) 500 MG tablet Take by mouth. 08/11/20   [provider]  nitroGLYCERIN (NITROSTAT) 0.4 MG SL tablet Place 1 tablet (0.4 mg total) under the tongue every 5 (five) minutes as needed for chest pain. 07/20/21 07/20/22  Marjie Skiff E, PA-C  ONETOUCH VERIO test strip 3 (three) times daily. 08/13/19   [provider]    Family History Family History  Problem Relation Age of Onset   Hypertension Mother    Heart failure Mother    Diabetes Mother    Kidney cancer Mother    Diabetes Father    Multiple  sclerosis Father    Colon cancer Neg Hx    Stomach cancer Neg Hx    Pancreatic cancer Neg Hx    Esophageal cancer Neg Hx    Liver disease Neg Hx     Social History Social History   Tobacco Use   Smoking status: Never   Smokeless tobacco: Never  Vaping Use   Vaping Use: Never used  Substance Use Topics   Alcohol use: No   Drug use: No     Allergies   Penicillins and Clindamycin/lincomycin   Review of Systems Review of Systems  Constitutional:  Negative for chills and fever.  HENT:  Positive for ear pain and sore throat. Negative for congestion.   Eyes:  Negative for discharge and redness.  Respiratory:  Negative for cough, shortness of breath and wheezing.   Gastrointestinal:  Negative for abdominal pain,  diarrhea, nausea and vomiting.     Physical Exam Triage Vital Signs ED Triage Vitals  Enc Vitals Group     BP 08/24/21 0810 129/78     Pulse Rate 08/24/21 0810 73     Resp 08/24/21 0810 18     Temp 08/24/21 0810 97.7 F (36.5 C)     Temp Source 08/24/21 0810 Oral     SpO2 08/24/21 0810 98 %     Weight --      Height --      Head Circumference --      Peak Flow --      Pain Score 08/24/21 0811 7     Pain Loc --      Pain Edu? --      Excl. in GC? --    No data found.  Updated Vital Signs BP 129/78 (BP Location: Left Arm)   Pulse 73   Temp 97.7 F (36.5 C) (Oral)   Resp 18   LMP 12/31/2012   SpO2 98%      Physical Exam Vitals and nursing note reviewed.  Constitutional:      General: She is not in acute distress.    Appearance: Normal appearance. She is not ill-appearing.  HENT:     Head: Normocephalic and atraumatic.     Left Ear: Tympanic membrane normal.     Ears:     Comments: Right TM dull    Nose: No congestion or rhinorrhea.     Mouth/Throat:     Mouth: Mucous membranes are moist.     Pharynx: Posterior oropharyngeal erythema present. No oropharyngeal exudate.  Eyes:     Conjunctiva/sclera: Conjunctivae normal.  Cardiovascular:      Rate and Rhythm: Normal rate.  Pulmonary:     Effort: Pulmonary effort is normal. No respiratory distress.  Skin:    General: Skin is warm and dry.  Neurological:     Mental Status: She is alert.  Psychiatric:        Mood and Affect: Mood normal.        Thought Content: Thought content normal.      UC Treatments / Results  Labs (all labs ordered are listed, but only abnormal results are displayed) Labs Reviewed  CULTURE, GROUP A STREP [facility])  POCT RAPID STREP A (OFFICE)    EKG   Radiology No results found.  Procedures Procedures (including critical care time)  Medications Ordered in UC Medications - No data to display  Initial Impression / Assessment and Plan / UC Course  I have reviewed the triage vital signs and the nursing notes.  Pertinent labs & imaging results that were available during my care of the patient were reviewed by me and considered in my medical decision making (see chart for details).  Strep test negative.  Culture ordered. Flonase prescribed for suspected eustachian tube dysfunction.  Encouraged follow-up if symptoms fail to improve or worsen.   Final Clinical Impressions(s) / UC Diagnoses   Final diagnoses:  Acute pharyngitis, unspecified etiology  Dysfunction of right eustachian tube   Discharge Instructions   None    ED Prescriptions     Medication Sig Dispense Auth. Provider   fluticasone (FLONASE) 50 MCG/ACT nasal spray Place 1 spray into both nostrils daily for 14 days. 15.8 mL Tomi Bamberger, PA-C      PDMP not reviewed this encounter.   Tomi Bamberger, PA-C 08/24/21 [phone]

## 2021-08-24 NOTE — ED Triage Notes (Signed)
Patient presents to [facility] for evaluation of 3 days of sore throat.  States it was improving but ths morning worse.  Has been doing salt water gargles, numbing spray and lozenges without much relief.   No fever, no cough, no runny nose.  No exposure.

## 2021-08-27 ENCOUNTER — Encounter (HOSPITAL_COMMUNITY): Payer: Self-pay

## 2021-08-27 ENCOUNTER — Emergency Department (HOSPITAL_COMMUNITY)
Admission: EM | Admit: 2021-08-27 | Discharge: 2021-08-27 | Disposition: A | Discharge status: Home / Self Care | Payer: Federal, State, Local not specified - PPO | Attending: Emergency Medicine | Admitting: Emergency Medicine

## 2021-08-27 ENCOUNTER — Other Ambulatory Visit: Payer: Self-pay

## 2021-08-27 ENCOUNTER — Emergency Department (HOSPITAL_COMMUNITY): Payer: Federal, State, Local not specified - PPO

## 2021-08-27 DIAGNOSIS — R0789 Other chest pain: Secondary | ICD-10-CM | POA: Diagnosis not present

## 2021-08-27 DIAGNOSIS — Z7984 Long term (current) use of oral hypoglycemic drugs: Secondary | ICD-10-CM | POA: Insufficient documentation

## 2021-08-27 DIAGNOSIS — I10 Essential (primary) hypertension: Secondary | ICD-10-CM | POA: Diagnosis not present

## 2021-08-27 DIAGNOSIS — Z20822 Contact with and (suspected) exposure to covid-19: Secondary | ICD-10-CM | POA: Insufficient documentation

## 2021-08-27 DIAGNOSIS — E119 Type 2 diabetes mellitus without complications: Secondary | ICD-10-CM | POA: Insufficient documentation

## 2021-08-27 DIAGNOSIS — Z7982 Long term (current) use of aspirin: Secondary | ICD-10-CM | POA: Diagnosis not present

## 2021-08-27 DIAGNOSIS — Z794 Long term (current) use of insulin: Secondary | ICD-10-CM | POA: Insufficient documentation

## 2021-08-27 DIAGNOSIS — Z7902 Long term (current) use of antithrombotics/antiplatelets: Secondary | ICD-10-CM | POA: Insufficient documentation

## 2021-08-27 DIAGNOSIS — R079 Chest pain, unspecified: Secondary | ICD-10-CM | POA: Diagnosis not present

## 2021-08-27 DIAGNOSIS — R1312 Dysphagia, oropharyngeal phase: Secondary | ICD-10-CM | POA: Insufficient documentation

## 2021-08-27 DIAGNOSIS — R1310 Dysphagia, unspecified: Secondary | ICD-10-CM | POA: Diagnosis not present

## 2021-08-27 LAB — BASIC METABOLIC PANEL
Anion gap: 14 (ref 5–15)
BUN: 14 mg/dL (ref 8–23)
CO2: 22 mmol/L (ref 22–32)
Calcium: 9.2 mg/dL (ref 8.9–10.3)
Chloride: 98 mmol/L (ref 98–111)
Creatinine, Ser: 1.12 mg/dL — ABNORMAL HIGH (ref 0.44–1.00)
GFR, Estimated: 55 mL/min — ABNORMAL LOW (ref >60)
Glucose, Bld: 258 mg/dL — ABNORMAL HIGH (ref 70–99)
Potassium: 4.4 mmol/L (ref 3.5–5.1)
Sodium: 134 mmol/L — ABNORMAL LOW (ref 135–145)

## 2021-08-27 LAB — CBC WITH DIFFERENTIAL/PLATELET
Abs Immature Granulocytes: 0.02 10*3/uL (ref 0.00–0.07)
Basophils Absolute: 0.1 10*3/uL (ref 0.0–0.1)
Basophils Relative: 1 %
Eosinophils Absolute: 0.2 10*3/uL (ref 0.0–0.5)
Eosinophils Relative: 1 %
HCT: 42.3 % (ref 36.0–46.0)
Hemoglobin: 13.6 g/dL (ref 12.0–15.0)
Immature Granulocytes: 0 %
Lymphocytes Relative: 15 %
Lymphs Abs: 1.8 10*3/uL (ref 0.7–4.0)
MCH: 27.8 pg (ref 26.0–34.0)
MCHC: 32.2 g/dL (ref 30.0–36.0)
MCV: 86.5 fL (ref 80.0–100.0)
Monocytes Absolute: 0.9 10*3/uL (ref 0.1–1.0)
Monocytes Relative: 8 %
Neutro Abs: 8.8 10*3/uL — ABNORMAL HIGH (ref 1.7–7.7)
Neutrophils Relative %: 75 %
Platelets: 301 10*3/uL (ref 150–400)
RBC: 4.89 MIL/uL (ref 3.87–5.11)
RDW: 13.4 % (ref 11.5–15.5)
WBC: 11.8 10*3/uL — ABNORMAL HIGH (ref 4.0–10.5)
nRBC: 0 % (ref 0.0–0.2)

## 2021-08-27 LAB — CULTURE, GROUP A STREP (THRC)

## 2021-08-27 LAB — RESP PANEL BY RT-PCR (FLU A&B, COVID) ARPGX2
Influenza A by PCR: NEGATIVE
Influenza B by PCR: NEGATIVE
SARS Coronavirus 2 by RT PCR: NEGATIVE

## 2021-08-27 LAB — TROPONIN I (HIGH SENSITIVITY)
Troponin I (High Sensitivity): 5 ng/L (ref <18)
Troponin I (High Sensitivity): 7 ng/L (ref <18)

## 2021-08-27 MED ORDER — ALUM & MAG HYDROXIDE-SIMETH 200-200-20 MG/5ML PO SUSP
30.0000 mL | Freq: Once | ORAL | Status: AC
  Administered 2021-08-27: 30 mL via ORAL
  Filled 2021-08-27: qty 30

## 2021-08-27 MED ORDER — MAALOX MAX 400-400-40 MG/5ML PO SUSP
10.0000 mL | Freq: Four times a day (QID) | ORAL | 0 refills | Status: DC | PRN
Start: 2021-08-27 — End: 2022-06-15

## 2021-08-27 MED ORDER — DICYCLOMINE HCL 10 MG/5ML PO SOLN
10.0000 mg | Freq: Once | ORAL | Status: DC
  Filled 2021-08-27: qty 5

## 2021-08-27 MED ORDER — DICYCLOMINE HCL 20 MG PO TABS: 20.0000 mg | ORAL_TABLET | Freq: Two times a day (BID) | ORAL | 0 refills | Status: DC

## 2021-08-27 NOTE — Discharge Instructions (Addendum)
Your cardiac work-up today is negative.  Regardless, I believe it is reasonable to touch base with your cardiologist about your visit today.    I have sent to medications for your stomach to your pharmacy.  You may take them simultaneously.  I have also attached information about dysphagia to these discharge papers.  There is a food plan for you to use as well that may help you avoid that will be difficult to swallow.  Ultimately, it is very important for you to follow-up with your GI doctor.  Please do not hesitate to return with any recurring or worsening symptoms.

## 2021-08-27 NOTE — ED Notes (Signed)
Pt is having trouble swallowing food/liquid X3 days, "it just does not want to go down.  When it does I feel it go down."  Pt states she feels that her glucose is high b/c "I have been using a lot of cough drops."

## 2021-08-27 NOTE — ED Provider Triage Note (Signed)
Emergency Medicine Provider Triage Evaluation Note  Monica Daniels , a 61 y.o. female  was evaluated in triage.  Pt complains of chest pain.  Patient states that earlier in the week she was having fluid in her ear and had some sore throat.  She states that about 2 days ago in the evening she began having chest discomfort that she describes as heaviness in the center of the chest that radiated down into her abdomen.  She stated it felt like indigestion.  She was also having palpitations at the time.  Denies shortness of breath.  She states that the pain returned again today so she presents to the ED.  She has a history of 4 stent placements from coronary artery disease, currently anticoagulated on Plavix..  Review of Systems  Positive:  Negative:   Physical Exam  BP (!) 140/60 (BP Location: Right Arm)   Pulse (!) 107   Temp 98.8 F (37.1 C) (Oral)   Resp 20   Ht 5\' 6"  (1.676 m)   Wt 114.3 kg   LMP 12/31/2012   SpO2 100%   BMI 40.67 kg/m  Gen:   Awake, no distress   Resp:  Normal effort, lungs clear MSK:   Moves extremities without difficulty  Other:  S1/S2 without murmur, no lower extremity swelling  Medical Decision Making  Medically screening exam initiated at 1:01 PM.  Appropriate orders placed.  Monica Daniels was informed that the remainder of the evaluation will be completed by another provider, this initial triage assessment does not replace that evaluation, and the importance of remaining in the ED until their evaluation is complete.     Maurice March, PA-C 08/27/21 1302

## 2021-08-27 NOTE — ED Provider Notes (Signed)
[facility] EMERGENCY DEPARTMENT Provider Note   CSN: [id number] Arrival date & time: 08/27/21  1245     History  Chief Complaint  Patient presents with   Chest Pain    Monica Daniels is a 61 y.o. female with a past medical history of hyperlipidemia, obesity, type 2 diabetes, hypertension and gastroparesis presenting today with chest pain.  She says it started on Wednesday night and was coming and going.  York Spaniel it would last about 30 seconds then go away.  It was more frequent and severe on Thursday but then went away.  This morning it started again.  She has a history of CAD status post stent placement.  No history of ACS.  She says that the pain seems to come on after she is eating solid foods.  She reports that it often feels as though it is getting stuck in her throat and by the time it gets closer to her chest with very painful.  Chest Pain      Home Medications Prior to Admission medications   Medication Sig Start Date End Date Taking? Authorizing Provider  aspirin 81 MG EC tablet TAKE 1 TABLET BY MOUTH DAILY. SWALLOW WHOLE. Patient taking differently: Take 81 mg by mouth daily. 09/09/19   Runell Gess, MD  atorvastatin (LIPITOR) 80 MG tablet Take 1 tablet (80 mg total) by mouth at bedtime. 08/24/21   Runell Gess, MD  B-D UF III MINI PEN NEEDLES 31G X 5 MM MISC USE TO INJECT LANTUS DAILY AS DIRECTED 03/04/19   [provider]  clopidogrel (PLAVIX) 75 MG tablet Take 1 tablet (75 mg total) by mouth daily. SCHEDULE OFFICE VISIT FOR FUTURE REFILLS. 07/01/21   Runell Gess, MD  COVID-19 mRNA Vac-TriS, Pfizer, (PFIZER-BIONT COVID-19 VAC-TRIS) SUSP injection Inject into the muscle. 05/08/20   Judyann Munson, MD  cyanocobalamin 1000 MCG tablet Take 1,000 mcg by mouth daily with supper.    [provider]  ezetimibe (ZETIA) 10 MG tablet TAKE 1 TABLET BY MOUTH EVERY DAY 07/09/21   Runell Gess, MD  FARXIGA 10 MG TABS tablet Take 10 mg by mouth  daily. 04/08/19   [provider]  fluticasone (FLONASE) 50 MCG/ACT nasal spray Place 1 spray into both nostrils daily for 14 days. 08/24/21 09/07/21  Tomi Bamberger, PA-C  LANTUS SOLOSTAR 100 UNIT/ML Solostar Pen Inject 8 Units into the skin at bedtime. 04/14/20   [provider]  latanoprost (XALATAN) 0.005 % ophthalmic solution Place 1 drop into both eyes at bedtime. 07/06/20   [provider]  lisinopril (ZESTRIL) 10 MG tablet Take 10 mg by mouth daily. 05/13/19   [provider]  loratadine (CLARITIN) 10 MG tablet Take 10 mg by mouth daily.    [provider]  metFORMIN (GLUCOPHAGE-XR) 500 MG 24 hr tablet Take 1,000 mg by mouth 2 (two) times daily. 09/17/19   [provider]  Multiple Vitamin (MULTIVITAMIN WITH MINERALS) TABS tablet Take 1 tablet by mouth at bedtime.    [provider]  mycophenolate (CELLCEPT) 500 MG tablet Take by mouth. 08/11/20   [provider]  nitroGLYCERIN (NITROSTAT) 0.4 MG SL tablet Place 1 tablet (0.4 mg total) under the tongue every 5 (five) minutes as needed for chest pain. 07/20/21 07/20/22  Marjie Skiff E, PA-C  ONETOUCH VERIO test strip 3 (three) times daily. 08/13/19   [provider]      Allergies    Penicillins and Clindamycin/lincomycin  Review of Systems   Review of Systems  Cardiovascular:  Positive for chest pain.    Physical Exam Updated Vital Signs BP 114/77   Pulse 84   Temp 98.8 F (37.1 C) (Oral)   Resp 15   Ht 5\' 6"  (1.676 m)   Wt 114.3 kg   LMP 12/31/2012   SpO2 100%   BMI 40.67 kg/m  Physical Exam Vitals and nursing note reviewed.  Constitutional:      General: She is not in acute distress.    Appearance: Normal appearance. She is not ill-appearing.  HENT:     Head: Normocephalic and atraumatic.     Mouth/Throat:     Comments: Airway clear, tolerating secretions.  No sign of food bolus or other abnormality. Eyes:     General: No scleral  icterus.    Conjunctiva/sclera: Conjunctivae normal.  Cardiovascular:     Rate and Rhythm: Normal rate and regular rhythm.     Heart sounds: Normal heart sounds.  Pulmonary:     Effort: Pulmonary effort is normal. No respiratory distress.     Breath sounds: Normal breath sounds.  Chest:     Chest wall: No mass or tenderness.  Abdominal:     Palpations: Abdomen is soft. There is no mass.  Skin:    General: Skin is warm and dry.     Findings: No rash.  Neurological:     Mental Status: She is alert.  Psychiatric:        Mood and Affect: Mood normal.     ED Results / Procedures / Treatments   Labs (all labs ordered are listed, but only abnormal results are displayed) Labs Reviewed  BASIC METABOLIC PANEL - Abnormal; Notable for the following components:      Result Value   Sodium 134 (*)    Glucose, Bld 258 (*)    Creatinine, Ser 1.12 (*)    GFR, Estimated 55 (*)    All other components within normal limits  CBC WITH DIFFERENTIAL/PLATELET - Abnormal; Notable for the following components:   WBC 11.8 (*)    Neutro Abs 8.8 (*)    All other components within normal limits  RESP PANEL BY RT-PCR (FLU A&B, COVID) ARPGX2  TROPONIN I (HIGH SENSITIVITY)  TROPONIN I (HIGH SENSITIVITY)    EKG None  Radiology DG Chest 2 View  Result Date: 08/27/2021 CLINICAL DATA:  Chest pain EXAM: CHEST - 2 VIEW COMPARISON:  None Available. FINDINGS: Cardiac size is within normal limits. Lung fields are clear of any infiltrate or pulmonary edema. Small linear densities in left lower lung fields suggest scarring or subsegmental atelectasis. There is no pleural effusion or pneumothorax. IMPRESSION: There are no signs of pulmonary edema or focal pulmonary consolidation. Small linear densities in left lower lung fields suggest scarring or subsegmental atelectasis. Electronically Signed   By: 10/27/2021 M.D.   On: 08/27/2021 13:35    Procedures Procedures   Medications Ordered in  ED Medications  dicyclomine (BENTYL) 10 MG/5ML solution 10 mg (has no administration in time range)  alum & mag hydroxide-simeth (MAALOX/MYLANTA) 200-200-20 MG/5ML suspension 30 mL (30 mLs Oral Given 08/27/21 1621)    ED Course/ Medical Decision Making/ A&P                           Medical Decision Making Risk OTC drugs. Prescription drug management.   This patient presents to the ED for concern of chest pain.  Differential  includes but is not limited to ACS, PE, dissection, GERD, gastritis, angina.   This is not an exhaustive differential.    Past Medical History / Co-morbidities / Social History: Also with cardiology for CAD and peripheral vascular disease.   Additional history: I reviewed patient's visit with urgent care 3 days ago.  She complained about sore throat and dysphagia at that time.  COVID, flu and strep were negative.  She was found to have a middle ear effusion and given Flonase.   Physical Exam: Normal ear exam Normal cardiopulmonary auscultation No chest wall tenderness No abdominal tenderness  Lab Tests: I ordered, and personally interpreted labs.  There are no pertinent results.  Glucose elevated, reportedly baseline.  Negative Trope x2   Imaging Studies: I ordered and independently visualized and interpreted x-ray which showed no acute findings. I agree with the radiologist interpretation.   Cardiac Monitoring:  The patient was maintained on a cardiac monitor.  My attending physician Dr. Roderic Palau viewed and interpreted the cardiac monitored which showed an underlying rhythm of: Sinus   Medications: I ordered medication including GI cocktail. Reevaluation of the patient after these medicines showed that the patient improved. I have reviewed the patients home medicines and have made adjustments as needed.   Consultations Obtained: Considered a cardiology consult however with negative troponins and a history of it being related to dysphagia, low suspicion  cardiac cause of her discomfort.  Disposition: 61 year old female presenting with chest pain.  Reports it is provoked by difficulty swallowing and occasional "choking."  She reports she often chokes on water, peas and other solid foods.  She also often feels as though food takes a long time to go down her esophagus into her stomach.  Says that the chest pain comes and goes but feels to be related.  Her EKG is stable from baseline.  Troponins are negative x2.  Heart score is 4.  I suspect GI cause of her chest pain but encouraged her to follow-up with her cardiologist as well.  She is already established with gastroenterology so she can see them outpatient 2.  Considered admission for further work-up however patient is hemodynamically stable and agreeable to discharge home.      Final Clinical Impression(s) / ED Diagnoses Final diagnoses:  Oropharyngeal dysphagia    Rx / DC Orders ED Discharge Orders          Ordered    dicyclomine (BENTYL) 20 MG tablet  2 times daily        08/27/21 1645    alum & mag hydroxide-simeth (MAALOX MAX) [id number] MG/5ML suspension  Every 6 hours PRN        08/27/21 1645           Results and diagnoses were explained to the patient. Return precautions discussed in full. Patient had no additional questions and expressed complete understanding.   This chart was dictated using voice recognition software.  Despite best efforts to proofread,  errors can occur which can change the documentation meaning.     Darliss Ridgel 08/27/21 1711    Milton Ferguson, MD 08/28/21 1727

## 2021-08-27 NOTE — ED Triage Notes (Signed)
Pt arrived POV from home c/o centralized CP that does not radiate since late Wednesday night. Pt denies any SHOB, N/V.

## 2021-08-28 ENCOUNTER — Other Ambulatory Visit: Payer: Self-pay | Admitting: Cardiovascular Disease

## 2021-08-31 ENCOUNTER — Ambulatory Visit (INDEPENDENT_AMBULATORY_CARE_PROVIDER_SITE_OTHER): Payer: Federal, State, Local not specified - PPO | Admitting: Podiatry

## 2021-08-31 DIAGNOSIS — E1151 Type 2 diabetes mellitus with diabetic peripheral angiopathy without gangrene: Secondary | ICD-10-CM

## 2021-08-31 DIAGNOSIS — M79609 Pain in unspecified limb: Secondary | ICD-10-CM

## 2021-08-31 DIAGNOSIS — I739 Peripheral vascular disease, unspecified: Secondary | ICD-10-CM

## 2021-08-31 DIAGNOSIS — B351 Tinea unguium: Secondary | ICD-10-CM | POA: Diagnosis not present

## 2021-08-31 NOTE — Progress Notes (Unsigned)
Subjective: 61 year old female presents the office today for diabetic foot evaluation.  She denies any ulcerations.  She states that she noticed some bleeding on her right big toenail about 2 weeks ago and she wants to make sure is not infected.  She states that she tried to pull back the skin around the nail and noticed some bleeding.  No purulence that she has noted.  Also asking the nails be trimmed as are thickened elongated she cannot do them herself.    Last A1c was 8.2 on February 05, 2021 last glucose was 102 she reports.    She is on Plavix.    Objective: AAO x3, NAD DP/PT pulses palpable bilaterally, CRT less than 3 seconds Callus sub 1 and 5 Bilateral hallux nail transverse slit No open lesions. No pain with calf compression, swelling, warmth, erythema  Assessment: Symptomatic onychomycosis, uncontrolled type 2 diabetes  Plan: -All treatment options discussed with the patient including all alternatives, risks, complications.  -Sharp debride the nails x 10 without any complications or bleeding.  Recommend her not to pull the skin back or try to cut the nails or self given her history of being on Plavix and with elevated A1c.  For now recommended a apply a small amount of antibiotic ointment on the nail.  Monitor closely for signs or symptoms of infection.  Should anything change to let me know immediately. -Daily foot inspection, glucose control.  Vivi Barrack DPM

## 2021-09-14 ENCOUNTER — Ambulatory Visit
Admission: EM | Admit: 2021-09-14 | Discharge: 2021-09-14 | Disposition: A | Discharge status: Home / Self Care | Payer: Federal, State, Local not specified - PPO | Attending: Internal Medicine | Admitting: Internal Medicine

## 2021-09-14 ENCOUNTER — Encounter: Payer: Self-pay | Admitting: Emergency Medicine

## 2021-09-14 DIAGNOSIS — R051 Acute cough: Secondary | ICD-10-CM | POA: Diagnosis not present

## 2021-09-14 DIAGNOSIS — J069 Acute upper respiratory infection, unspecified: Secondary | ICD-10-CM | POA: Diagnosis not present

## 2021-09-14 MED ORDER — DOXYCYCLINE HYCLATE 100 MG PO CAPS: 100.0000 mg | ORAL_CAPSULE | Freq: Two times a day (BID) | ORAL | 0 refills | Status: DC

## 2021-09-14 MED ORDER — BENZONATATE 100 MG PO CAPS: 100.0000 mg | ORAL_CAPSULE | Freq: Three times a day (TID) | ORAL | 0 refills | Status: DC | PRN

## 2021-09-14 NOTE — Discharge Instructions (Signed)
You have been prescribed an antibiotic called doxycycline as well as a cough medication.  Please take this medication with food.  Please follow-up if symptoms persist or worsen.

## 2021-09-14 NOTE — ED Triage Notes (Signed)
Pt is present today with c/o cough. Pt sx started Friday.

## 2021-09-14 NOTE — ED Provider Notes (Signed)
[facility]    CSN: [id number] Arrival date & time: 09/14/21  0845      History   Chief Complaint Chief Complaint  Patient presents with   Cough    HPI Monica Daniels is a 61 y.o. female.   Patient presents with cough, sneezing, runny nose that started about a week ago.  Patient reports cough is harsh.  Denies chest pain, shortness of breath, sore throat, ear pain, nausea, vomiting, diarrhea, abdominal pain.  Denies history of asthma or COPD but reports history of bronchitis.  Denies any known fevers or sick contacts.   Cough   Past Medical History:  Diagnosis Date   CAD (coronary artery disease)    s/p overlapping DES x2 to proximal RCA and orbital atherectomy and DES x2 to proximal and mid LAD in 07/2019   Diabetes mellitus without complication (HCC)    Hyperlipidemia    Hypertension    PAD (peripheral artery disease) (HCC)    s/p atherectomy and stenting of right SFA in 05/2020    Patient Active Problem List   Diagnosis Date Noted   Body mass index (BMI) 40.0-44.9, adult (HCC) 06/23/2020   Dependence on other enabling machines and devices 06/23/2020   Long term (current) use of insulin (HCC) 06/23/2020   Obstructive sleep apnea 06/23/2020   Polyneuropathy due to type 2 diabetes mellitus (HCC) 03/18/2020   Myopia with regular astigmatism and presbyopia 09/20/2019   Coronary artery disease 08/27/2019   Chest pain of uncertain etiology 08/02/2019   Essential hypertension 06/07/2019   Peripheral arterial disease (HCC) 06/07/2019   Left carotid bruit 06/07/2019   Right hand pain 02/02/2016   HLD (hyperlipidemia) 06/25/2014   Non morbid obesity due to excess calories 06/25/2014   Type 2 diabetes mellitus without complication (HCC) 06/25/2014    Past Surgical History:  Procedure Laterality Date   ABDOMINAL AORTOGRAM W/LOWER EXTREMITY N/A 06/01/2020   Procedure: ABDOMINAL AORTOGRAM W/LOWER EXTREMITY;  Surgeon: Runell Gess, MD;  Location: [facility] IN[facility];  Service: Cardiovascular;  Laterality: N/A;   CARDIAC CATHETERIZATION     CORONARY ATHERECTOMY N/A 08/15/2019   Procedure: CORONARY ATHERECTOMY;  Surgeon: Runell Gess, MD;  Location: [facility] INVASIVE CV LAB;  Service: Cardiovascular;  Laterality: N/A;   CORONARY STENT INTERVENTION N/A 08/15/2019   Procedure: CORONARY STENT INTERVENTION;  Surgeon: Runell Gess, MD;  Location: [facility] INVASIVE CV LAB;  Service: Cardiovascular;  Laterality: N/A;   HAND TENDON SURGERY     LEFT HEART CATH AND CORONARY ANGIOGRAPHY N/A 08/12/2019   Procedure: LEFT HEART CATH AND CORONARY ANGIOGRAPHY;  Surgeon: Runell Gess, MD;  Location: [facility]AS[facility];  Service: Cardiovascular;  Laterality: N/A;   PERIPHERAL VASCULAR ATHERECTOMY  06/01/2020   Procedure: PERIPHERAL VASCULAR ATHERECTOMY;  Surgeon: Runell Gess, MD;  Location: [facility] INVASIVE CV LAB;  Service: Cardiovascular;;  right SFA   PERIPHERAL VASCULAR INTERVENTION  06/01/2020   Procedure: PERIPHERAL VASCULAR INTERVENTION;  Surgeon: Runell Gess, MD;  Location: [facility]VAS[facility];  Service: Cardiovascular;;  rt SFA    OB History   No obstetric history on file.      Home Medications    Prior to Admission medications   Medication Sig Start Date End Date Taking? Authorizing Provider  benzonatate (TESSALON) 100 MG capsule Take 1 capsule (100 mg total) by mouth every 8 (eight) hours as needed for cough. 09/14/21  Yes Sami Roes, Rolly Salter E, FNP  doxycycline (VIBRAMYCIN) 100 MG capsule Take 1 capsule (100  mg total) by mouth 2 (two) times daily. 09/14/21  Yes Marlinda Miranda, Acie Fredrickson, FNP  alum & mag hydroxide-simeth (MAALOX MAX) 400-400-40 MG/5ML suspension Take 10 mLs by mouth every 6 (six) hours as needed for indigestion. 08/27/21   Redwine, Madison A, PA-C  aspirin 81 MG EC tablet TAKE 1 TABLET BY MOUTH DAILY. SWALLOW WHOLE. Patient taking differently: Take 81 mg by mouth daily. 09/09/19   Runell Gess, MD  atorvastatin (LIPITOR) 80 MG tablet Take 1 tablet  (80 mg total) by mouth at bedtime. 08/24/21   Runell Gess, MD  B-D UF III MINI PEN NEEDLES 31G X 5 MM MISC USE TO INJECT LANTUS DAILY AS DIRECTED 03/04/19   [provider]  clopidogrel (PLAVIX) 75 MG tablet Take 1 tablet (75 mg total) by mouth daily. 08/30/21   Runell Gess, MD  COVID-19 mRNA Vac-TriS, Pfizer, (PFIZER-BIONT COVID-19 VAC-TRIS) SUSP injection Inject into the muscle. 05/08/20   Judyann Munson, MD  cyanocobalamin 1000 MCG tablet Take 1,000 mcg by mouth daily with supper.    [provider]  dicyclomine (BENTYL) 20 MG tablet Take 1 tablet (20 mg total) by mouth 2 (two) times daily. 08/27/21   Redwine, Madison A, PA-C  ezetimibe (ZETIA) 10 MG tablet TAKE 1 TABLET BY MOUTH EVERY DAY 07/09/21   Runell Gess, MD  FARXIGA 10 MG TABS tablet Take 10 mg by mouth daily. 04/08/19   [provider]  fluticasone (FLONASE) 50 MCG/ACT nasal spray Place 1 spray into both nostrils daily for 14 days. 08/24/21 09/07/21  Tomi Bamberger, PA-C  LANTUS SOLOSTAR 100 UNIT/ML Solostar Pen Inject 8 Units into the skin at bedtime. 04/14/20   [provider]  latanoprost (XALATAN) 0.005 % ophthalmic solution Place 1 drop into both eyes at bedtime. 07/06/20   [provider]  lisinopril (ZESTRIL) 10 MG tablet Take 10 mg by mouth daily. 05/13/19   [provider]  loratadine (CLARITIN) 10 MG tablet Take 10 mg by mouth daily.    [provider]  metFORMIN (GLUCOPHAGE-XR) 500 MG 24 hr tablet Take 1,000 mg by mouth 2 (two) times daily. 09/17/19   [provider]  Multiple Vitamin (MULTIVITAMIN WITH MINERALS) TABS tablet Take 1 tablet by mouth at bedtime.    [provider]  mycophenolate (CELLCEPT) 500 MG tablet Take by mouth. 08/11/20   [provider]  nitroGLYCERIN (NITROSTAT) 0.4 MG SL tablet Place 1 tablet (0.4 mg total) under the tongue every 5 (five) minutes as needed for chest pain. 07/20/21 07/20/22  Marjie Skiff E, PA-C   ONETOUCH VERIO test strip 3 (three) times daily. 08/13/19   [provider]    Family History Family History  Problem Relation Age of Onset   Hypertension Mother    Heart failure Mother    Diabetes Mother    Kidney cancer Mother    Diabetes Father    Multiple sclerosis Father    Colon cancer Neg Hx    Stomach cancer Neg Hx    Pancreatic cancer Neg Hx    Esophageal cancer Neg Hx    Liver disease Neg Hx     Social History Social History   Tobacco Use   Smoking status: Never   Smokeless tobacco: Never  Vaping Use   Vaping Use: Never used  Substance Use Topics   Alcohol use: No   Drug use: No     Allergies   Penicillins and Clindamycin/lincomycin   Review of Systems Review of Systems  Per HPI  Physical Exam Triage Vital Signs ED Triage Vitals  Enc Vitals Group     BP 09/14/21 0937 110/65     Pulse Rate 09/14/21 0937 91     Resp 09/14/21 0937 18     Temp 09/14/21 0937 98.6 F (37 C)     Temp src --      SpO2 09/14/21 0937 98 %     Weight --      Height --      Head Circumference --      Peak Flow --      Pain Score 09/14/21 0936 0     Pain Loc --      Pain Edu? --      Excl. in GC? --    No data found.  Updated Vital Signs BP 110/65   Pulse 91   Temp 98.6 F (37 C)   Resp 18   LMP 12/31/2012   SpO2 98%   Visual Acuity Right Eye Distance:   Left Eye Distance:   Bilateral Distance:    Right Eye Near:   Left Eye Near:    Bilateral Near:     Physical Exam Constitutional:      General: She is not in acute distress.    Appearance: Normal appearance. She is not toxic-appearing or diaphoretic.  HENT:     Head: Normocephalic and atraumatic.     Right Ear: Tympanic membrane and ear canal normal.     Left Ear: Tympanic membrane and ear canal normal.     Nose: Congestion present.     Mouth/Throat:     Mouth: Mucous membranes are moist.     Pharynx: No posterior oropharyngeal erythema.  Eyes:     Extraocular Movements: Extraocular  movements intact.     Conjunctiva/sclera: Conjunctivae normal.     Pupils: Pupils are equal, round, and reactive to light.  Cardiovascular:     Rate and Rhythm: Normal rate and regular rhythm.     Pulses: Normal pulses.     Heart sounds: Normal heart sounds.  Pulmonary:     Effort: Pulmonary effort is normal. No respiratory distress.     Breath sounds: Normal breath sounds. No stridor. No wheezing, rhonchi or rales.  Abdominal:     General: Abdomen is flat. Bowel sounds are normal.     Palpations: Abdomen is soft.  Musculoskeletal:        General: Normal range of motion.     Cervical back: Normal range of motion.  Skin:    General: Skin is warm and dry.  Neurological:     General: No focal deficit present.     Mental Status: She is alert and oriented to person, place, and time. Mental status is at baseline.  Psychiatric:        Mood and Affect: Mood normal.        Behavior: Behavior normal.      UC Treatments / Results  Labs (all labs ordered are listed, but only abnormal results are displayed) Labs Reviewed - No data to display  EKG   Radiology No results found.  Procedures Procedures (including critical care time)  Medications Ordered in UC Medications - No data to display  Initial Impression / Assessment and Plan / UC Course  I have reviewed the triage vital signs and the nursing notes.  Pertinent labs & imaging results that were available during my care of the patient were reviewed by me and considered in my medical decision  making (see chart for details).     Patient has persistent upper respiratory infection.  Will opt to treat with doxycycline given duration of symptoms.  Suspicious of possible bronchitis as well.  Do not think chest imaging is necessary given no adventitious lung sounds on exam.  Benzonatate prescribed to take as needed for cough.  Discussed supportive care and symptom management with patient.  Discussed return precautions.  Patient  verbalized understanding was agreeable with plan. Final Clinical Impressions(s) / UC Diagnoses   Final diagnoses:  Acute upper respiratory infection  Acute cough     Discharge Instructions      You have been prescribed an antibiotic called doxycycline as well as a cough medication.  Please take this medication with food.  Please follow-up if symptoms persist or worsen.    ED Prescriptions     Medication Sig Dispense Auth. Provider   doxycycline (VIBRAMYCIN) 100 MG capsule Take 1 capsule (100 mg total) by mouth 2 (two) times daily. 20 capsule [place], [place] E, [place]   benzonatate (TESSALON) 100 MG capsule Take 1 capsule (100 mg total) by mouth every 8 (eight) hours as needed for cough. 21 capsule [place], Acie Fredrickson, [place]      PDMP not reviewed this encounter.   Gustavus Bryant, [place] 09/14/21 1011

## 2021-09-29 ENCOUNTER — Ambulatory Visit: Payer: Federal, State, Local not specified - PPO | Admitting: Physician Assistant

## 2021-09-29 ENCOUNTER — Telehealth: Payer: Self-pay

## 2021-09-29 ENCOUNTER — Encounter: Payer: Self-pay | Admitting: Physician Assistant

## 2021-09-29 VITALS — BP 110/58 | HR 72 | Ht 66.0 in | Wt 253.2 lb

## 2021-09-29 DIAGNOSIS — R1319 Other dysphagia: Secondary | ICD-10-CM

## 2021-09-29 DIAGNOSIS — Z86010 Personal history of colonic polyps: Secondary | ICD-10-CM

## 2021-09-29 MED ORDER — NA SULFATE-K SULFATE-MG SULF 17.5-3.13-1.6 GM/177ML PO SOLN: 1.0000 | Freq: Once | ORAL | 0 refills | Status: AC

## 2021-09-29 NOTE — Progress Notes (Signed)
Chief Complaint: Dysphagia  HPI:    Monica Daniels is a 61 year old female with a past medical history of CAD status post DES x2 on Plavix (last stent 08/15/2019), diabetes and PAD, assigned to Dr. Christella Hartigan, who was referred to me by Shon Hale, * for a complaint of dysphagia.    04/28/2016 colonoscopy done for colon cancer screening with a 2 mm tubular adenoma removed and repeat was recommended in 5 years.    09/02/2020 patient seen in clinic for left-sided abdominal pain, change in bowel habits, early satiety and history of colon polyps.  She was scheduled for diagnostic colonoscopy with Dr. Christella Hartigan in the [facility].  Does not look like she ever had this done.    08/27/2021 ED visit for oropharyngeal dysphagia.  Time of presentation described that she had chest pain which had started a few nights before and was coming and going in last 30 seconds.  This seemed to come on after she ate foods.  BMP with a sodium of 134, glucose 258, creatinine 1.12 and otherwise normal.  CBC with a white count 11.8 and otherwise normal.  Troponins normal.  Chest x-ray with no sign of pulmonary edema or focal pulmonary consolidation, small linear densities in the left lower lung field suggested scarring or subsegmental atelectasis.  Patient was given GI cocktail.  She improved.  She describes sensation of "choking" at the time.  She was prescribed Bentyl 20 mg twice daily for suspected esophageal spasm.    Today, patient tells me that she is doing fairly well.  She did have a recent diagnosis of bronchitis at the end of August but has treated that and feels better.  As far as her chronic GI symptoms she is on a probiotic and tells me that has really helped as well as the elimination diet given to her to decrease foods that bother her such as "onions and broccoli".  Tells me in general she only has very few episodes of left upper quadrant pain now which seem to come and go.  They do not concern her.  She is aware she is due for  colonoscopy and would like to get that set up.  As far as her visit to the ER for dysphagia tells me that for about a week prior to going to the ER she was feeling like foods would stop in her throat on the way down and would cause her some chest pain, due to her history of cardiac issues she decided to go to the ER and they put her on Bentyl for about 20 days twice daily and she tells me now she has had no further spasms.  She would still like to get this checked out though to make sure nothing else is going on.    Denies fever, chills, weight loss or blood in her stool.  Past Medical History:  Diagnosis Date   CAD (coronary artery disease)    s/p overlapping DES x2 to proximal RCA and orbital atherectomy and DES x2 to proximal and mid LAD in 07/2019   Diabetes mellitus without complication (HCC)    Hyperlipidemia    Hypertension    PAD (peripheral artery disease) (HCC)    s/p atherectomy and stenting of right SFA in 05/2020    Past Surgical History:  Procedure Laterality Date   ABDOMINAL AORTOGRAM W/LOWER EXTREMITY N/A 06/01/2020   Procedure: ABDOMINAL AORTOGRAM W/LOWER EXTREMITY;  Surgeon: Runell Gess, MD;  Location: [facility] INVASIVE CV LAB;  Service: Cardiovascular;  Laterality: N/A;   CARDIAC CATHETERIZATION     CORONARY ATHERECTOMY N/A 08/15/2019   Procedure: CORONARY ATHERECTOMY;  Surgeon: Runell Gess, MD;  Location: [facility] INVASIVE CV LAB;  Service: Cardiovascular;  Laterality: N/A;   CORONARY STENT INTERVENTION N/A 08/15/2019   Procedure: CORONARY STENT INTERVENTION;  Surgeon: Runell Gess, MD;  Location: [facility] INVASIVE CV LAB;  Service: Cardiovascular;  Laterality: N/A;   HAND TENDON SURGERY     LEFT HEART CATH AND CORONARY ANGIOGRAPHY N/A 08/12/2019   Procedure: LEFT HEART CATH AND CORONARY ANGIOGRAPHY;  Surgeon: Runell Gess, MD;  Location: [facility]VAS[facility];  Service: Cardiovascular;  Laterality: N/A;   PERIPHERAL VASCULAR ATHERECTOMY  06/01/2020   Procedure: PERIPHERAL  VASCULAR ATHERECTOMY;  Surgeon: Runell Gess, MD;  Location: [facility] INVASIVE CV LAB;  Service: Cardiovascular;;  right SFA   PERIPHERAL VASCULAR INTERVENTION  06/01/2020   Procedure: PERIPHERAL VASCULAR INTERVENTION;  Surgeon: Runell Gess, MD;  Location: [facility] INVASIVE CV LAB;  Service: Cardiovascular;;  rt SFA    Current Outpatient Medications  Medication Sig Dispense Refill   alum & mag hydroxide-simeth (MAALOX MAX) 400-400-40 MG/5ML suspension Take 10 mLs by mouth every 6 (six) hours as needed for indigestion. 355 mL 0   aspirin 81 MG EC tablet TAKE 1 TABLET BY MOUTH DAILY. SWALLOW WHOLE. (Patient taking differently: Take 81 mg by mouth daily.) 30 tablet 12   atorvastatin (LIPITOR) 80 MG tablet Take 1 tablet (80 mg total) by mouth at bedtime. 90 tablet 3   B-D UF III MINI PEN NEEDLES 31G X 5 MM MISC USE TO INJECT LANTUS DAILY AS DIRECTED     benzonatate (TESSALON) 100 MG capsule Take 1 capsule (100 mg total) by mouth every 8 (eight) hours as needed for cough. 21 capsule 0   clopidogrel (PLAVIX) 75 MG tablet Take 1 tablet (75 mg total) by mouth daily. 90 tablet 2   COVID-19 mRNA Vac-TriS, Pfizer, (PFIZER-BIONT COVID-19 VAC-TRIS) SUSP injection Inject into the muscle. 0.3 mL 0   cyanocobalamin 1000 MCG tablet Take 1,000 mcg by mouth daily with supper.     dicyclomine (BENTYL) 20 MG tablet Take 1 tablet (20 mg total) by mouth 2 (two) times daily. 20 tablet 0   doxycycline (VIBRAMYCIN) 100 MG capsule Take 1 capsule (100 mg total) by mouth 2 (two) times daily. 20 capsule 0   ezetimibe (ZETIA) 10 MG tablet TAKE 1 TABLET BY MOUTH EVERY DAY 90 tablet 3   FARXIGA 10 MG TABS tablet Take 10 mg by mouth daily.     fluticasone (FLONASE) 50 MCG/ACT nasal spray Place 1 spray into both nostrils daily for 14 days. 15.8 mL 0   LANTUS SOLOSTAR 100 UNIT/ML Solostar Pen Inject 8 Units into the skin at bedtime.     latanoprost (XALATAN) 0.005 % ophthalmic solution Place 1 drop into both eyes at bedtime.      lisinopril (ZESTRIL) 10 MG tablet Take 10 mg by mouth daily.     loratadine (CLARITIN) 10 MG tablet Take 10 mg by mouth daily.     metFORMIN (GLUCOPHAGE-XR) 500 MG 24 hr tablet Take 1,000 mg by mouth 2 (two) times daily.     Multiple Vitamin (MULTIVITAMIN WITH MINERALS) TABS tablet Take 1 tablet by mouth at bedtime.     mycophenolate (CELLCEPT) 500 MG tablet Take by mouth.     nitroGLYCERIN (NITROSTAT) 0.4 MG SL tablet Place 1 tablet (0.4 mg total) under the tongue every 5 (five) minutes as needed for chest  pain. 25 tablet 2   ONETOUCH VERIO test strip 3 (three) times daily.     No current facility-administered medications for this visit.    Allergies as of 09/29/2021 - Review Complete 09/29/2021  Allergen Reaction Noted   Penicillins Anaphylaxis 02/21/2014   Clindamycin/lincomycin Rash 02/22/2014    Family History  Problem Relation Age of Onset   Hypertension Mother    Heart failure Mother    Diabetes Mother    Kidney cancer Mother    Diabetes Father    Multiple sclerosis Father    Colon cancer Neg Hx    Stomach cancer Neg Hx    Pancreatic cancer Neg Hx    Esophageal cancer Neg Hx    Liver disease Neg Hx     Social History   Socioeconomic History   Marital status: Single    Spouse name: Not on file   Number of children: 0   Years of education: Not on file   Highest education level: Not on file  Occupational History   Occupation: Retired  Tobacco Use   Smoking status: Never   Smokeless tobacco: Never  Vaping Use   Vaping Use: Never used  Substance and Sexual Activity   Alcohol use: No   Drug use: No   Sexual activity: Not Currently  Other Topics Concern   Not on file  Social History Narrative   Not on file   Social Determinants of Health   Financial Resource Strain: Not on file  Food Insecurity: Not on file  Transportation Needs: Not on file  Physical Activity: Not on file  Stress: Not on file  Social Connections: Not on file  Intimate Partner Violence:  Not on file    Review of Systems:    Constitutional: No weight loss, fever or chills Skin: No rash  Cardiovascular: No chest pain Respiratory: No SOB  Gastrointestinal: See HPI and otherwise negative Genitourinary: No dysuria  Neurological: No headache, dizziness or syncope Musculoskeletal: No new muscle or joint pain Hematologic: No bleeding Psychiatric: No history of depression or anxiety   Physical Exam:  Vital signs: BP (!) 110/58 (BP Location: Left Wrist, Patient Position: Sitting, Cuff Size: Normal)   Pulse 72   Ht 5\' 6"  (1.676 m) Comment: height measured without shoes  Wt 253 lb 4 oz (114.9 kg)   LMP 12/31/2012   BMI 40.88 kg/m    Constitutional:   Pleasant Obese AA female appears to be in NAD, Well developed, Well nourished, alert and cooperative Head:  Normocephalic and atraumatic. Eyes:   PEERL, EOMI. No icterus. Conjunctiva pink. Ears:  Normal auditory acuity. Neck:  Supple Throat: Oral cavity and pharynx without inflammation, swelling or lesion.  Respiratory: Respirations even and unlabored. Lungs clear to auscultation bilaterally.   No wheezes, crackles, or rhonchi.  Cardiovascular: Normal S1, S2. No MRG. Regular rate and rhythm. No peripheral edema, cyanosis or pallor.  Gastrointestinal:  Soft, nondistended, nontender. No rebound or guarding. Normal bowel sounds. No appreciable masses or hepatomegaly. Rectal:  Not performed.  Msk:  Symmetrical without gross deformities. Without edema, no deformity or joint abnormality.  Neurologic:  Alert and  oriented x4;  grossly normal neurologically.  Skin:   Dry and intact without significant lesions or rashes. Psychiatric:  Demonstrates good judgement and reason without abnormal affect or behaviors.  RELEVANT LABS AND IMAGING: CBC    Component Value Date/Time   WBC 11.8 (H) 08/27/2021 1308   RBC 4.89 08/27/2021 1308   HGB 13.6 08/27/2021 1308  HGB 13.7 05/27/2020 1123   HCT 42.3 08/27/2021 1308   HCT 41.2  05/27/2020 1123   PLT 301 08/27/2021 1308   PLT 317 05/27/2020 1123   MCV 86.5 08/27/2021 1308   MCV 84 05/27/2020 1123   MCH 27.8 08/27/2021 1308   MCHC 32.2 08/27/2021 1308   RDW 13.4 08/27/2021 1308   RDW 13.1 05/27/2020 1123   LYMPHSABS 1.8 08/27/2021 1308   LYMPHSABS 2.0 08/06/2019 0936   MONOABS 0.9 08/27/2021 1308   EOSABS 0.2 08/27/2021 1308   EOSABS 0.2 08/06/2019 0936   BASOSABS 0.1 08/27/2021 1308   BASOSABS 0.1 08/06/2019 0936    CMP     Component Value Date/Time   NA 134 (L) 08/27/2021 1308   NA 138 05/27/2020 1123   K 4.4 08/27/2021 1308   CL 98 08/27/2021 1308   CO2 22 08/27/2021 1308   GLUCOSE 258 (H) 08/27/2021 1308   BUN 14 08/27/2021 1308   BUN 17 05/27/2020 1123   CREATININE 1.12 (H) 08/27/2021 1308   CALCIUM 9.2 08/27/2021 1308   PROT 7.1 11/19/2019 0954   ALBUMIN 4.4 11/19/2019 0954   AST 18 11/19/2019 0954   ALT 18 11/19/2019 0954   ALKPHOS 76 11/19/2019 0954   BILITOT 0.5 11/19/2019 0954   GFRNONAA 55 (L) 08/27/2021 1308   GFRAA >60 08/15/2019 0737    Assessment: 1.  History of adenomatous polyps: Last colonoscopy in 2018 with recommendations for repeat in 5 years, patient is due now 2.  Dysphagia: Felt like foods got hung in her throat, better now after 20 days of Bentyl; consider most likely esophageal spasm versus stricture versus other  Plan: 1.  Scheduled patient for surveillance colonoscopy and EGD with possible dilation for dysphagia in the [facility] with Dr. Orvan Falconer as Dr. Christella Hartigan is out on extended leave.  Did provide the patient a detailed list of risks for the procedures and she agrees to proceed. Patient is appropriate for endoscopic procedure(s) in the ambulatory ([facility]) setting. 2.  Patient advised to hold her Plavix for 5 days prior to time of procedure.  We will discuss holding her Plavix with Dr. Allyson Sabal to ensure this is acceptable for her. 3.  Patient should continue her probiotic and elimination diet as this seems helpful for her. 4.   Patient to follow in clinic per recommendations after time of procedure.  Hyacinth Meeker, PA-C [facility] 09/29/2021, 9:50 AM  Cc: Shon Hale, *

## 2021-09-29 NOTE — Telephone Encounter (Signed)
[facility] Pre-operative Risk Assessment     Request for surgical clearance:     Endoscopy Procedure  What type of surgery is being performed?     Endo/ Colon  When is this surgery scheduled?     10/25/21  What type of clearance is required ?   Pharmacy  Are there any medications that need to be held prior to surgery and how long? Plavix & 5 days  Practice name and name of physician performing surgery?      [facility]  What is your office phone and fax number?      Phone- [phone]  [phone]  Anesthesia type (None, local, MAC, general) ?       MAC

## 2021-09-29 NOTE — Telephone Encounter (Signed)
Left message to call back for tele pre op appt 

## 2021-09-29 NOTE — Patient Instructions (Addendum)
If you are age 63 or older, your body mass index should be between 23-30. Your Body mass index is 40.88 kg/m. If this is out of the aforementioned range listed, please consider follow up with your Primary Care Provider.  If you are age 62 or younger, your body mass index should be between 19-25. Your Body mass index is 40.88 kg/m. If this is out of the aformentioned range listed, please consider follow up with your Primary Care Provider.   ________________________________________________________  The [facility] providers would like to encourage you to use [facility] to communicate with providers for non-urgent requests or questions.  Due to long hold times on the telephone, sending your provider a message by [facility] may be a faster and more efficient way to get a response.  Please allow 48 business hours for a response.  Please remember that this is for non-urgent requests.  _______________________________________________________   Bonita Quin have been scheduled for an endoscopy and colonoscopy. Please follow the written instructions given to you at your visit today. Please pick up your prep supplies at the pharmacy within the next 1-3 days. If you use inhalers (even only as needed), please bring them with you on the day of your procedure.   You will be contacted by our office prior to your procedure for directions on holding your Plavix.  If you do not hear from our office 1 week prior to your scheduled procedure, please call [phone] to discuss.    Due to recent changes in healthcare laws, you may see the results of your imaging and laboratory studies on MyChart before your provider has had a chance to review them.  We understand that in some cases there may be results that are confusing or concerning to you. Not all laboratory results come back in the same time frame and the provider may be waiting for multiple results in order to interpret others.  Please give [place] 48 hours in order for your provider to  thoroughly review all the results before contacting the office for clarification of your results.    It was a pleasure to see you today!  Thank you for trusting me with your gastrointestinal care!

## 2021-09-29 NOTE — Telephone Encounter (Signed)
Primary Cardiologist:Jonathan Allyson Sabal, MD   Preoperative team, please contact this patient and set up a phone call appointment for further preoperative risk assessment (clearance previously completed > 2 months)  Please obtain consent and complete medication review. Thank you for your help.   Request to hold Plavix x5 days which is reasonable pending recent symptoms of ACS to be addressed at the time of the call.   Levi Aland, NP-C     09/29/2021, 11:01 AM [address] Office [phone] Fax [phone]

## 2021-09-30 ENCOUNTER — Telehealth: Payer: Self-pay | Admitting: *Deleted

## 2021-09-30 NOTE — Telephone Encounter (Signed)
Pt agreeable to plan of care for tele pre op appt 10/15/21 @ 9 am. Med rec and consent are done.     Patient Consent for Virtual Visit        Monica Daniels has provided verbal consent on 09/30/2021 for a virtual visit (video or telephone).   CONSENT FOR VIRTUAL VISIT FOR:  Monica Daniels  By participating in this virtual visit I agree to the following:  I hereby voluntarily request, consent and authorize [facility] and its employed or contracted physicians, physician assistants, nurse practitioners or other licensed health care professionals (the Practitioner), to provide me with telemedicine health care services (the "Services") as deemed necessary by the treating Practitioner. I acknowledge and consent to receive the Services by the Practitioner via telemedicine. I understand that the telemedicine visit will involve communicating with the Practitioner through live audiovisual communication technology and the disclosure of certain medical information by electronic transmission. I acknowledge that I have been given the opportunity to request an in-person assessment or other available alternative prior to the telemedicine visit and am voluntarily participating in the telemedicine visit.  I understand that I have the right to withhold or withdraw my consent to the use of telemedicine in the course of my care at any time, without affecting my right to future care or treatment, and that the Practitioner or I may terminate the telemedicine visit at any time. I understand that I have the right to inspect all information obtained and/or recorded in the course of the telemedicine visit and may receive copies of available information for a reasonable fee.  I understand that some of the potential risks of receiving the Services via telemedicine include:  Delay or interruption in medical evaluation due to technological equipment failure or disruption; Information transmitted may not be sufficient (e.g.  poor resolution of images) to allow for appropriate medical decision making by the Practitioner; and/or  In rare instances, security protocols could fail, causing a breach of personal health information.  Furthermore, I acknowledge that it is my responsibility to provide information about my medical history, conditions and care that is complete and accurate to the best of my ability. I acknowledge that Practitioner's advice, recommendations, and/or decision may be based on factors not within their control, such as incomplete or inaccurate data provided by me or distortions of diagnostic images or specimens that may result from electronic transmissions. I understand that the practice of medicine is not an exact science and that Practitioner makes no warranties or guarantees regarding treatment outcomes. I acknowledge that a copy of this consent can be made available to me via my patient portal [facility] MyChart), or I can request a printed copy by calling the office of [facility].    I understand that my insurance will be billed for this visit.   I have read or had this consent read to me. I understand the contents of this consent, which adequately explains the benefits and risks of the Services being provided via telemedicine.  I have been provided ample opportunity to ask questions regarding this consent and the Services and have had my questions answered to my satisfaction. I give my informed consent for the services to be provided through the use of telemedicine in my medical care

## 2021-09-30 NOTE — Telephone Encounter (Signed)
Pt agreeable to plan of care for tele pre op appt 10/15/21 @ 9 am. Med rec and consent are done.

## 2021-10-10 NOTE — Progress Notes (Signed)
Reviewed and agree with management plans. ? ?Malike Foglio L. Kadeen Sroka, MD, MPH  ?

## 2021-10-15 ENCOUNTER — Ambulatory Visit: Payer: Federal, State, Local not specified - PPO | Attending: Cardiology | Admitting: Physician Assistant

## 2021-10-15 DIAGNOSIS — Z01810 Encounter for preprocedural cardiovascular examination: Secondary | ICD-10-CM | POA: Diagnosis not present

## 2021-10-15 NOTE — Progress Notes (Signed)
Virtual Visit via Telephone Note   Because of Monica Daniels's co-morbid illnesses, she is at least at moderate risk for complications without adequate follow up.  This format is felt to be most appropriate for this patient at this time.  The patient did not have access to video technology/had technical difficulties with video requiring transitioning to audio format only (telephone).  All issues noted in this document were discussed and addressed.  No physical exam could be performed with this format.  Please refer to the patient's chart for her consent to telehealth for [facility].  Evaluation Performed:  Preoperative cardiovascular risk assessment _____________   Date:  10/15/2021   Patient ID:  Monica Daniels, DOB 07-21-1960, MRN [id number] Patient Location:  Home Provider location:   Office  Primary Care Provider:  Glenis Smoker, MD Primary Cardiologist:  Quay Burow, MD  Chief Complaint / Patient Profile   61 y.o. y/o female with a h/o CAD status post DES x2 to proximal RCA and orbital atherectomy and DES to proximal and mid LAD in 2021, hyperlipidemia, hypertension, PAD, diabetes mellitus without complication who is pending endoscopy and presents today for telephonic preoperative cardiovascular risk assessment.  Past Medical History    Past Medical History:  Diagnosis Date   CAD (coronary artery disease)    s/p overlapping DES x2 to proximal RCA and orbital atherectomy and DES x2 to proximal and mid LAD in 07/2019   Diabetes mellitus without complication (HCC)    Hyperlipidemia    Hypertension    PAD (peripheral artery disease) (HCC)    s/p atherectomy and stenting of right SFA in 05/2020   Past Surgical History:  Procedure Laterality Date   ABDOMINAL AORTOGRAM W/LOWER EXTREMITY N/A 06/01/2020   Procedure: ABDOMINAL AORTOGRAM W/LOWER EXTREMITY;  Surgeon: Lorretta Harp, MD;  Location: [place] [facility];  Service: Cardiovascular;  Laterality: N/A;    CARDIAC CATHETERIZATION     CORONARY ATHERECTOMY N/A 08/15/2019   Procedure: CORONARY ATHERECTOMY;  Surgeon: Lorretta Harp, MD;  Location: [place] [facility];  Service: Cardiovascular;  Laterality: N/A;   CORONARY STENT INTERVENTION N/A 08/15/2019   Procedure: CORONARY STENT INTERVENTION;  Surgeon: Lorretta Harp, MD;  Location: [place] [facility];  Service: Cardiovascular;  Laterality: N/A;   HAND TENDON SURGERY     LEFT HEART CATH AND CORONARY ANGIOGRAPHY N/A 08/12/2019   Procedure: LEFT HEART CATH AND CORONARY ANGIOGRAPHY;  Surgeon: Lorretta Harp, MD;  Location: [place] [facility];  Service: Cardiovascular;  Laterality: N/A;   PERIPHERAL VASCULAR ATHERECTOMY  06/01/2020   Procedure: PERIPHERAL VASCULAR ATHERECTOMY;  Surgeon: Lorretta Harp, MD;  Location: [place] [facility];  Service: Cardiovascular;;  right SFA   PERIPHERAL VASCULAR INTERVENTION  06/01/2020   Procedure: PERIPHERAL VASCULAR INTERVENTION;  Surgeon: Lorretta Harp, MD;  Location: [place] [facility];  Service: Cardiovascular;;  rt SFA    Allergies  Allergies  Allergen Reactions   Penicillins Anaphylaxis    Swelling Has patient had a PCN reaction causing immediate rash, facial/tongue/throat swelling, SOB or lightheadedness with hypotension: YES Has patient had a PCN reaction causing severe rash involving mucus membranes or skin necrosis: NO Has patient had a PCN reaction that required hospitalization NO Has patient had a PCN reaction occurring within the last 10 years: NO If all of the above answers are "NO", then may proceed with Cephalosporin use.   Clindamycin/Lincomycin Rash    History of Present Illness    Monica Daniels is  a 61 y.o. female who presents via audio/video conferencing for a telehealth visit today.  Pt was last seen in cardiology clinic on 07/20/21 by Sande Rives, PA-C.  At that time [facility] was doing well.  The patient is now pending procedure as outlined above. Since her last visit, she  denies chest pain or shortness of breath. She used to bowl and plans to start swimming next month. Because of this she scored a 5.99 Mets on the DASI.  This exceeds the minimum 5 METS requirement.  Okay for the patient to hold Plavix x5 days prior to the procedure.  Please restart medically safe to do so.   Home Medications    Prior to Admission medications   Medication Sig Start Date End Date Taking? Authorizing Provider  alum & mag hydroxide-simeth (MAALOX MAX) 400-400-40 MG/5ML suspension Take 10 mLs by mouth every 6 (six) hours as needed for indigestion. 08/27/21   Redwine, Madison A, PA-C  aspirin 81 MG EC tablet TAKE 1 TABLET BY MOUTH DAILY. SWALLOW WHOLE. Patient taking differently: Take 81 mg by mouth daily. 09/09/19   Lorretta Harp, MD  atorvastatin (LIPITOR) 80 MG tablet Take 1 tablet (80 mg total) by mouth at bedtime. 08/24/21   Lorretta Harp, MD  B-D UF III MINI PEN NEEDLES 31G X 5 MM MISC USE TO INJECT LANTUS DAILY AS DIRECTED 03/04/19   [provider]  clopidogrel (PLAVIX) 75 MG tablet Take 1 tablet (75 mg total) by mouth daily. 08/30/21   Lorretta Harp, MD  cyanocobalamin 1000 MCG tablet Take 1,000 mcg by mouth daily with supper.    [provider]  dicyclomine (BENTYL) 20 MG tablet Take 1 tablet (20 mg total) by mouth 2 (two) times daily. Patient not taking: Reported on 09/29/2021 08/27/21   Redwine, Madison A, PA-C  ezetimibe (ZETIA) 10 MG tablet TAKE 1 TABLET BY MOUTH EVERY DAY 07/09/21   Lorretta Harp, MD  FARXIGA 10 MG TABS tablet Take 10 mg by mouth daily. 04/08/19   [provider]  Insulin Glargine (BASAGLAR KWIKPEN [place]) Inject 14 Units into the skin at bedtime. 09/23/21   [provider]  latanoprost (XALATAN) 0.005 % ophthalmic solution Place 1 drop into both eyes at bedtime. 07/06/20   [provider]  lisinopril (ZESTRIL) 10 MG tablet Take 10 mg by mouth daily. 05/13/19   [provider]  loratadine (CLARITIN) 10 MG  tablet Take 10 mg by mouth daily.    [provider]  metFORMIN (GLUCOPHAGE-XR) 500 MG 24 hr tablet Take 1,000 mg by mouth 2 (two) times daily. 09/17/19   [provider]  Multiple Vitamin (MULTIVITAMIN WITH MINERALS) TABS tablet Take 1 tablet by mouth at bedtime.    [provider]  mycophenolate (CELLCEPT) 500 MG tablet Take by mouth. 08/11/20   [provider]  nitroGLYCERIN (NITROSTAT) 0.4 MG SL tablet Place 1 tablet (0.4 mg total) under the tongue every 5 (five) minutes as needed for chest pain. Patient not taking: Reported on 09/29/2021 07/20/21 07/20/22  Darreld Mclean, PA-C  [facility] VERIO test strip 3 (three) times daily. 08/13/19   [provider]    Physical Exam    Vital Signs:  Monica Daniels does not have vital signs available for review today.  Given telephonic nature of communication, physical exam is limited. AAOx3. NAD. Normal affect.  Speech and respirations are unlabored.  Accessory Clinical Findings    None  Assessment & Plan    1.  Preoperative Cardiovascular Risk Assessment:  Ms. Broeker perioperative risk of a major cardiac event is 6.6% according to the Revised Cardiac Risk Index (RCRI).  Therefore, she is at high risk for perioperative complications.   Her functional capacity is good at 5.99 METs according to the [place] Activity Status Index (DASI). Recommendations: According to ACC/AHA guidelines, no further cardiovascular testing needed.  The patient may proceed to surgery at acceptable risk.   Antiplatelet and/or Anticoagulation Recommendations: Clopidogrel (Plavix) can be held for 5 days prior to her surgery and resumed as soon as possible post op.  A copy of this note will be routed to requesting surgeon.  Time:   Today, I have spent 10 minutes with the patient with telehealth technology discussing medical history, symptoms, and management plan.     Elgie Collard, PA-C  10/15/2021, 8:58 AM

## 2021-10-18 ENCOUNTER — Encounter: Payer: Self-pay | Admitting: Gastroenterology

## 2021-10-25 ENCOUNTER — Ambulatory Visit (AMBULATORY_SURGERY_CENTER): Payer: Federal, State, Local not specified - PPO | Admitting: Gastroenterology

## 2021-10-25 ENCOUNTER — Encounter: Payer: Self-pay | Admitting: Gastroenterology

## 2021-10-25 VITALS — BP 111/57 | HR 73 | Temp 96.6°F | Resp 11 | Ht 66.0 in | Wt 253.0 lb

## 2021-10-25 DIAGNOSIS — Z09 Encounter for follow-up examination after completed treatment for conditions other than malignant neoplasm: Secondary | ICD-10-CM

## 2021-10-25 DIAGNOSIS — Z86010 Personal history of colonic polyps: Secondary | ICD-10-CM

## 2021-10-25 DIAGNOSIS — R1319 Other dysphagia: Secondary | ICD-10-CM | POA: Diagnosis not present

## 2021-10-25 DIAGNOSIS — K219 Gastro-esophageal reflux disease without esophagitis: Secondary | ICD-10-CM | POA: Diagnosis not present

## 2021-10-25 MED ORDER — SODIUM CHLORIDE 0.9 % IV SOLN: 500.0000 mL | INTRAVENOUS | Status: AC

## 2021-10-25 NOTE — Op Note (Addendum)
[facility] Patient Name: Monica Daniels Procedure Date: 10/25/2021 2:09 PM MRN: [id number] Endoscopist: Thornton Park MD, MD Age: 61 Referring MD:  Date of Birth: 21-Feb-1960 Gender: Female Account #: [id number] Procedure:                Upper GI endoscopy Indications:              Dysphagia Medicines:                Monitored Anesthesia Care Procedure:                Pre-Anesthesia Assessment:                           - Prior to the procedure, a History and Physical                            was performed, and patient medications and                            allergies were reviewed. The patient's tolerance of                            previous anesthesia was also reviewed. The risks                            and benefits of the procedure and the sedation                            options and risks were discussed with the patient.                            All questions were answered, and informed consent                            was obtained. Prior Anticoagulants: The patient has                            taken Plavix (clopidogrel), last dose was 5 days                            prior to procedure. ASA Grade Assessment: II - A                            patient with mild systemic disease. After reviewing                            the risks and benefits, the patient was deemed in                            satisfactory condition to undergo the procedure.                           After obtaining informed consent, the endoscope was  passed under direct vision. Throughout the                            procedure, the patient's blood pressure, pulse, and                            oxygen saturations were monitored continuously. The                            Endoscope was introduced through the mouth, and                            advanced to the third part of duodenum. The upper                            GI endoscopy was accomplished  without difficulty.                            The patient tolerated the procedure well. Scope In: Scope Out: Findings:                 No endoscopic abnormality was evident in the                            esophagus to explain the patient's complaint of                            dysphagia. It was decided, however, to proceed with                            dilation of the lower third of the esophagus. A TTS                            dilator was passed through the scope. Dilation with                            a 16-17-18 mm balloon dilator was performed to 18                            mm. There was no resistance to a fully inflated                            ballon. No rent or tear seen on completion of                            dilation. After dilation, biopsies were obtained                            from the mid/proximal and distal esophagus with                            cold forceps for histology of suspected  eosinophilic esophagitis.                           Patchy mildly erythematous mucosa without bleeding                            was found in the gastric antrum. Biopsies were                            taken from the antrum, body, and fundus with a cold                            forceps for histology. Estimated blood loss was                            minimal.                           Patchy mildly erythematous mucosa without active                            bleeding and with no stigmata of bleeding was found                            in the duodenal bulb. Biopsies were taken with a                            cold forceps for histology. Estimated blood loss                            was minimal.                           The cardia and gastric fundus were normal on                            retroflexion.                           The exam was otherwise without abnormality. Complications:            No immediate  complications. Estimated Blood Loss:     Estimated blood loss was minimal. Impression:               - No endoscopic esophageal abnormality to explain                            patient's dysphagia. Esophagus dilated. Biopsied.                           - Erythematous mucosa in the antrum. Biopsied.                           - Erythematous duodenopathy. Biopsied.                           -  The examination was otherwise normal. Recommendation:           - Patient has a contact number available for                            emergencies. The signs and symptoms of potential                            delayed complications were discussed with the                            patient. Return to normal activities tomorrow.                            Written discharge instructions were provided to the                            patient.                           - Resume previous diet.                           - Continue present medications.                           - Await pathology results. Thornton Park MD, MD 10/25/2021 2:47:23 PM This report has been signed electronically.

## 2021-10-25 NOTE — Progress Notes (Signed)
Called to room to assist during endoscopic procedure.  Patient ID and intended procedure confirmed with present staff. Received instructions for my participation in the procedure from the performing physician.  

## 2021-10-25 NOTE — Progress Notes (Signed)
Indication for EGD: Dysphagia, atypical chest pain Indication for colonoscopy: History of polyps  04/28/2016 colonoscopy done for colon cancer screening with a 2 mm tubular adenoma removed and repeat was recommended in 5 years.  Please see the 09/29/2021 office note for complete details.  There is been no change in history or physical exam since that time.  The patient remains an appropriate candidate for monitored anesthesia care in the endoscopy center.

## 2021-10-25 NOTE — Patient Instructions (Signed)
Impression/Recommendations:  Dilation diet handout given to patient.  Resume previous diet. Continue present medications. Await pathology results.  Resume Plavix tomorrow, 10/26/2021.  YOU HAD AN ENDOSCOPIC PROCEDURE TODAY AT [facility]:   Refer to the procedure report that was given to you for any specific questions about what was found during the examination.  If the procedure report does not answer your questions, please call your gastroenterologist to clarify.  If you requested that your care partner not be given the details of your procedure findings, then the procedure report has been included in a sealed envelope for you to review at your convenience later.  YOU SHOULD EXPECT: Some feelings of bloating in the abdomen. Passage of more gas than usual.  Walking can help get rid of the air that was put into your GI tract during the procedure and reduce the bloating. If you had a lower endoscopy (such as a colonoscopy or flexible sigmoidoscopy) you may notice spotting of blood in your stool or on the toilet paper. If you underwent a bowel prep for your procedure, you may not have a normal bowel movement for a few days.  Please Note:  You might notice some irritation and congestion in your nose or some drainage.  This is from the oxygen used during your procedure.  There is no need for concern and it should clear up in a day or so.  SYMPTOMS TO REPORT IMMEDIATELY:  Following lower endoscopy (colonoscopy or flexible sigmoidoscopy):  Excessive amounts of blood in the stool  Significant tenderness or worsening of abdominal pains  Swelling of the abdomen that is new, acute  Fever of 100F or higher  Following upper endoscopy (EGD)  Vomiting of blood or coffee ground material  New chest pain or pain under the shoulder blades  Painful or persistently difficult swallowing  New shortness of breath  Fever of 100F or higher  Black, tarry-looking stools  For urgent or emergent  issues, a gastroenterologist can be reached at any hour by calling [phone]. Do not use MyChart messaging for urgent concerns.    DIET:  We do recommend a small meal at first, but then you may proceed to your regular diet.  Drink plenty of fluids but you should avoid alcoholic beverages for 24 hours.  ACTIVITY:  You should plan to take it easy for the rest of today and you should NOT DRIVE or use heavy machinery until tomorrow (because of the sedation medicines used during the test).    FOLLOW UP: Our staff will call the number listed on your records the next business day following your procedure.  We will call around 7:15- 8:00 am to check on you and address any questions or concerns that you may have regarding the information given to you following your procedure. If we do not reach you, we will leave a message.     If any biopsies were taken you will be contacted by phone or by letter within the next 1-3 weeks.  Please call us at [phone] if you have not heard about the biopsies in 3 weeks.    SIGNATURES/CONFIDENTIALITY: You and/or your care partner have signed paperwork which will be entered into your electronic medical record.  These signatures attest to the fact that that the information above on your After Visit Summary has been reviewed and is understood.  Full responsibility of the confidentiality of this discharge information lies with you and/or your care-partner.

## 2021-10-25 NOTE — Progress Notes (Signed)
Sedate, gd SR, tolerated procedure well, VSS, report to RN 

## 2021-10-25 NOTE — Op Note (Addendum)
[facility] Patient Name: Monica Daniels Procedure Date: 10/25/2021 2:07 PM MRN: [id number] Endoscopist: Tressia Danas MD, MD Age: 61 Referring MD:  Date of Birth: [birth year] Gender: Female Account #: [id number] Procedure:                Colonoscopy Indications:              Surveillance: Personal history of adenomatous                            polyps on last colonoscopy 5 years ago                           04/28/2016 colonoscopy done for colon cancer                            screening with a 2 mm tubular adenoma removed and                            repeat was recommended in 5 years. Medicines:                Monitored Anesthesia Care Procedure:                Pre-Anesthesia Assessment:                           - Prior to the procedure, a History and Physical                            was performed, and patient medications and                            allergies were reviewed. The patient's tolerance of                            previous anesthesia was also reviewed. The risks                            and benefits of the procedure and the sedation                            options and risks were discussed with the patient.                            All questions were answered, and informed consent                            was obtained. Prior Anticoagulants: The patient has                            taken Plavix (clopidogrel), last dose was 5 days                            prior to procedure. ASA Grade Assessment: II - A  patient with mild systemic disease. After reviewing                            the risks and benefits, the patient was deemed in                            satisfactory condition to undergo the procedure.                           After obtaining informed consent, the colonoscope                            was passed under direct vision. Throughout the                            procedure, the patient's blood  pressure, pulse, and                            oxygen saturations were monitored continuously. The                            CF [id number] #[id number] was introduced through the anus                            and advanced to the 3 cm into the ileum. The                            colonoscopy was performed without difficulty. The                            patient tolerated the procedure well. The quality                            of the bowel preparation was good. The terminal                            ileum, ileocecal valve, appendiceal orifice, and                            rectum were photographed. Scope In: 2:26:08 PM Scope Out: 2:43:11 PM Scope Withdrawal Time: 0 hours 14 minutes 4 seconds  Total Procedure Duration: 0 hours 17 minutes 3 seconds  Findings:                 The perianal and digital rectal examinations were                            normal.                           A single (solitary) one mm ulcer was found in the                            distal sigmoid colon. The surrounding mucosa  appeared normal. No bleeding was present. Biopsies                            were taken with a cold forceps for histology.                            Estimated blood loss was minimal.                           The entire examined colon appeared normal on direct                            and retroflexion views. Complications:            No immediate complications. Estimated Blood Loss:     Estimated blood loss: none. Impression:               - Small ulcer in the distal sigmoid colon. This is                            of unclear clinical significance.                           - No polyp or mass.                           - No specimens collected. Recommendation:           - Patient has a contact number available for                            emergencies. The signs and symptoms of potential                            delayed complications were discussed with  the                            patient. Return to normal activities tomorrow.                            Written discharge instructions were provided to the                            patient.                           - Resume previous diet.                           - Continue present medications.                           - Resume Plavix tomorrow, 10/26/21.                           - Await pathology results.                           -  Repeat colonoscopy in 10 years for surveillance,                            earlier with new symptoms.                           - Emerging evidence supports eating a diet of                            fruits, vegetables, grains, calcium, and yogurt                            while reducing red meat and alcohol may reduce the                            risk of colon cancer. Tressia Danas MD, MD 10/25/2021 2:51:57 PM This report has been signed electronically.

## 2021-10-26 ENCOUNTER — Telehealth: Payer: Self-pay | Admitting: *Deleted

## 2021-10-26 NOTE — Telephone Encounter (Signed)
  Follow up Call-     10/25/2021   12:44 PM  Call back number  Post procedure Call Back phone  # [phone]  Permission to leave phone message Yes     Patient questions:  Do you have a fever, pain , or abdominal swelling? No. Pain Score  0 *  Have you tolerated food without any problems? Yes.    Have you been able to return to your normal activities? Yes.    Do you have any questions about your discharge instructions: Diet   No. Medications  No. Follow up visit  No.  Do you have questions or concerns about your Care? No.  Actions: * If pain score is 4 or above: No action needed, pain <4.

## 2021-11-08 DIAGNOSIS — G4733 Obstructive sleep apnea (adult) (pediatric): Secondary | ICD-10-CM | POA: Diagnosis not present

## 2021-11-11 ENCOUNTER — Other Ambulatory Visit: Payer: Self-pay

## 2021-11-11 MED ORDER — PANTOPRAZOLE SODIUM 40 MG PO TBEC: 40.0000 mg | DELAYED_RELEASE_TABLET | Freq: Every day | ORAL | 1 refills | Status: DC

## 2021-11-25 DIAGNOSIS — E7800 Pure hypercholesterolemia, unspecified: Secondary | ICD-10-CM | POA: Diagnosis not present

## 2021-11-25 DIAGNOSIS — Z23 Encounter for immunization: Secondary | ICD-10-CM | POA: Diagnosis not present

## 2021-11-25 DIAGNOSIS — E1142 Type 2 diabetes mellitus with diabetic polyneuropathy: Secondary | ICD-10-CM | POA: Diagnosis not present

## 2021-11-25 DIAGNOSIS — I10 Essential (primary) hypertension: Secondary | ICD-10-CM | POA: Diagnosis not present

## 2021-11-25 DIAGNOSIS — R300 Dysuria: Secondary | ICD-10-CM | POA: Diagnosis not present

## 2021-11-29 ENCOUNTER — Ambulatory Visit: Payer: Federal, State, Local not specified - PPO | Admitting: Physician Assistant

## 2021-11-29 ENCOUNTER — Encounter: Payer: Self-pay | Admitting: Physician Assistant

## 2021-11-29 VITALS — BP 118/58 | HR 78 | Ht 66.0 in | Wt 251.6 lb

## 2021-11-29 DIAGNOSIS — K219 Gastro-esophageal reflux disease without esophagitis: Secondary | ICD-10-CM | POA: Diagnosis not present

## 2021-11-29 DIAGNOSIS — R109 Unspecified abdominal pain: Secondary | ICD-10-CM

## 2021-11-29 DIAGNOSIS — R1319 Other dysphagia: Secondary | ICD-10-CM | POA: Diagnosis not present

## 2021-11-29 NOTE — Patient Instructions (Signed)
Continue your Pantoprazole 40 mg daily for total of 8 weeks and then you can discontinue.  It was a pleasure to see you, glad you are doing well.  Please call if we can help in the future.  Sincerely, Ellouise Newer, PA-C

## 2021-11-29 NOTE — Progress Notes (Signed)
Chief Complaint: Follow-up EGD and colonoscopy  HPI:    Monica Daniels is a 61 year old female with a past medical history of CAD status post DES x2 on Plavix (last stent 08/15/2019), diabetes and PAD, assigned to Dr. Christella Hartigan (Dr. Orvan Falconer took care of her recently), who returns to clinic today for follow-up after recent EGD and colonoscopy.    09/29/2021 patient seen in clinic and at that time described her chronic GI symptoms, she had done well with a probiotic and illumination diet and only occasionally had left upper quadrant pain.  She was having some dysphagia.  At that time scheduled for surveillance colonoscopy given history of adenomatous polyps and an EGD.    10/25/2021 colonoscopy with a small ulcer in the distal sigmoid colon of unclear significance and otherwise normal.  Repeat recommended in 10 years.  Biopsy showed nonspecific/normal mucosa.  She was told to stop NSAIDs.    10/25/2021 EGD with no esophageal abnormality to explain dysphagia, the esophagus was dilated, erythematous mucosa in the antrum and erythematous duodenopathy.  Biopsies showed reflux esophagitis.  She was started on Pantoprazole 40 mg every morning for 8 weeks.    Today, the patient presents to clinic and explains that she is doing fairly well.  She has not had any trouble with dysphagia since being in the ER for this and is taking Pantoprazole 40 mg daily under direction of Dr. Orvan Falconer.  She does ask some questions in regards to her recent procedures.  She still gets a left upper quadrant/lower quadrant "twinge", throughout the day which comes and goes but it does not inhibit her life at all.  Tells me she still feels like it is related to when/what/how much she eats.  She is doing well.    Denies fever, chills, weight loss, change in bowel habits or symptoms that awaken her from sleep.  Past Medical History:  Diagnosis Date   Allergy    CAD (coronary artery disease)    s/p overlapping DES x2 to proximal RCA and orbital  atherectomy and DES x2 to proximal and mid LAD in 07/2019   Cataract    Diabetes mellitus without complication (HCC)    Glaucoma    Hyperlipidemia    Hypertension    PAD (peripheral artery disease) (HCC)    s/p atherectomy and stenting of right SFA in 05/2020   Sleep apnea    uses cpap    Past Surgical History:  Procedure Laterality Date   ABDOMINAL AORTOGRAM W/LOWER EXTREMITY N/A 06/01/2020   Procedure: ABDOMINAL AORTOGRAM W/LOWER EXTREMITY;  Surgeon: Runell Gess, MD;  Location: [facility];  Service: Cardiovascular;  Laterality: N/A;   CARDIAC CATHETERIZATION     CATARACT EXTRACTION     CORONARY ATHERECTOMY N/A 08/15/2019   Procedure: CORONARY ATHERECTOMY;  Surgeon: Runell Gess, MD;  Location: [facility];  Service: Cardiovascular;  Laterality: N/A;   CORONARY STENT INTERVENTION N/A 08/15/2019   Procedure: CORONARY STENT INTERVENTION;  Surgeon: Runell Gess, MD;  Location: [facility];  Service: Cardiovascular;  Laterality: N/A;   HAND TENDON SURGERY     LEFT HEART CATH AND CORONARY ANGIOGRAPHY N/A 08/12/2019   Procedure: LEFT HEART CATH AND CORONARY ANGIOGRAPHY;  Surgeon: Runell Gess, MD;  Location: [facility];  Service: Cardiovascular;  Laterality: N/A;   PERIPHERAL VASCULAR ATHERECTOMY  06/01/2020   Procedure: PERIPHERAL VASCULAR ATHERECTOMY;  Surgeon: Runell Gess, MD;  Location: [facility] INVAS[facility] CV LAB;  Service: Cardiovascular;;  right SFA   PERIPHERAL VASCULAR INTERVENTION  06/01/2020   Procedure: PERIPHERAL VASCULAR INTERVENTION;  Surgeon: Runell Gess, MD;  Location: [facility];  Service: Cardiovascular;;  rt SFA    Current Outpatient Medications  Medication Sig Dispense Refill   alum & mag hydroxide-simeth (MAALOX MAX) 400-400-40 MG/5ML suspension Take 10 mLs by mouth every 6 (six) hours as needed for indigestion. 355 mL 0   aspirin 81 MG EC tablet TAKE 1 TABLET BY MOUTH DAILY. SWALLOW WHOLE. (Patient taking  differently: Take 81 mg by mouth daily.) 30 tablet 12   atorvastatin (LIPITOR) 80 MG tablet Take 1 tablet (80 mg total) by mouth at bedtime. 90 tablet 3   B-D UF III MINI PEN NEEDLES 31G X 5 MM MISC USE TO INJECT LANTUS DAILY AS DIRECTED     clopidogrel (PLAVIX) 75 MG tablet Take 1 tablet (75 mg total) by mouth daily. 90 tablet 2   cyanocobalamin 1000 MCG tablet Take 1,000 mcg by mouth daily with supper.     ezetimibe (ZETIA) 10 MG tablet TAKE 1 TABLET BY MOUTH EVERY DAY 90 tablet 3   FARXIGA 10 MG TABS tablet Take 10 mg by mouth daily.     Insulin Glargine (BASAGLAR KWIKPEN [place]) Inject 14 Units into the skin at bedtime.     latanoprost (XALATAN) 0.005 % ophthalmic solution Place 1 drop into both eyes at bedtime.     lisinopril (ZESTRIL) 10 MG tablet Take 10 mg by mouth daily.     loratadine (CLARITIN) 10 MG tablet Take 10 mg by mouth daily.     metFORMIN (GLUCOPHAGE-XR) 500 MG 24 hr tablet Take 1,000 mg by mouth 2 (two) times daily.     Multiple Vitamin (MULTIVITAMIN WITH MINERALS) TABS tablet Take 1 tablet by mouth at bedtime.     mycophenolate (CELLCEPT) 500 MG tablet Take by mouth.     nitroGLYCERIN (NITROSTAT) 0.4 MG SL tablet Place 1 tablet (0.4 mg total) under the tongue every 5 (five) minutes as needed for chest pain. 25 tablet 2   ONETOUCH VERIO test strip 3 (three) times daily.     pantoprazole (PROTONIX) 40 MG tablet Take 1 tablet (40 mg total) by mouth daily. 30 tablet 1   Current Facility-Administered Medications  Medication Dose Route Frequency Provider Last Rate Last Admin   0.9 %  sodium chloride infusion  500 mL Intravenous Continuous Tressia Danas, MD        Allergies as of 11/29/2021 - Review Complete 11/29/2021  Allergen Reaction Noted   Penicillins Anaphylaxis 02/21/2014   Clindamycin/lincomycin Rash 02/22/2014    Family History  Problem Relation Age of Onset   Hypertension Mother    Heart failure Mother    Diabetes Mother    Kidney cancer Mother    Diabetes  Father    Multiple sclerosis Father    Colon cancer Neg Hx    Stomach cancer Neg Hx    Pancreatic cancer Neg Hx    Esophageal cancer Neg Hx    Liver disease Neg Hx     Social History   Socioeconomic History   Marital status: Single    Spouse name: Not on file   Number of children: 0   Years of education: Not on file   Highest education level: Not on file  Occupational History   Occupation: Retired  Tobacco Use   Smoking status: Never   Smokeless tobacco: Never  Vaping Use   Vaping Use: Never used  Substance and Sexual  Activity   Alcohol use: No   Drug use: No   Sexual activity: Not Currently  Other Topics Concern   Not on file  Social History Narrative   Not on file   Social Determinants of Health   Financial Resource Strain: Not on file  Food Insecurity: Not on file  Transportation Needs: Not on file  Physical Activity: Not on file  Stress: Not on file  Social Connections: Not on file  Intimate Partner Violence: Not on file    Review of Systems:    Constitutional: No weight loss, fever or chills Cardiovascular: No chest pain   Respiratory: No SOB Gastrointestinal: See HPI and otherwise negative   Physical Exam:  Vital signs: BP (!) 118/58   Pulse 78   Ht 5\' 6"  (1.676 m)   Wt 251 lb 9.6 oz (114.1 kg)   LMP 12/31/2012   SpO2 98%   BMI 40.61 kg/m   Constitutional:   Very Pleasant AA female appears to be in NAD, Well developed, Well nourished, alert and cooperative Respiratory: Respirations even and unlabored. Lungs clear to auscultation bilaterally.   No wheezes, crackles, or rhonchi.  Cardiovascular: Normal S1, S2. No MRG. Regular rate and rhythm. No peripheral edema, cyanosis or pallor.  Gastrointestinal:  Soft, nondistended, nontender. No rebound or guarding. Normal bowel sounds. No appreciable masses or hepatomegaly. Rectal:  Not performed.  Psychiatric: Demonstrates good judgement and reason without abnormal affect or behaviors.  RELEVANT LABS  AND IMAGING: CBC    Component Value Date/Time   WBC 11.8 (H) 08/27/2021 1308   RBC 4.89 08/27/2021 1308   HGB 13.6 08/27/2021 1308   HGB 13.7 05/27/2020 1123   HCT 42.3 08/27/2021 1308   HCT 41.2 05/27/2020 1123   PLT 301 08/27/2021 1308   PLT 317 05/27/2020 1123   MCV 86.5 08/27/2021 1308   MCV 84 05/27/2020 1123   MCH 27.8 08/27/2021 1308   MCHC 32.2 08/27/2021 1308   RDW 13.4 08/27/2021 1308   RDW 13.1 05/27/2020 1123   LYMPHSABS 1.8 08/27/2021 1308   LYMPHSABS 2.0 08/06/2019 0936   MONOABS 0.9 08/27/2021 1308   EOSABS 0.2 08/27/2021 1308   EOSABS 0.2 08/06/2019 0936   BASOSABS 0.1 08/27/2021 1308   BASOSABS 0.1 08/06/2019 0936    CMP     Component Value Date/Time   NA 134 (L) 08/27/2021 1308   NA 138 05/27/2020 1123   K 4.4 08/27/2021 1308   CL 98 08/27/2021 1308   CO2 22 08/27/2021 1308   GLUCOSE 258 (H) 08/27/2021 1308   BUN 14 08/27/2021 1308   BUN 17 05/27/2020 1123   CREATININE 1.12 (H) 08/27/2021 1308   CALCIUM 9.2 08/27/2021 1308   PROT 7.1 11/19/2019 0954   ALBUMIN 4.4 11/19/2019 0954   AST 18 11/19/2019 0954   ALT 18 11/19/2019 0954   ALKPHOS 76 11/19/2019 0954   BILITOT 0.5 11/19/2019 0954   GFRNONAA 55 (L) 08/27/2021 1308   GFRAA >60 08/15/2019 0737    Assessment: 1.  GERD: No trouble on Pantoprazole 40 mg daily 2.  Left sided abdominal pain: Very intermittent, recently normal colonoscopy, likely gas  Plan: 1.  Continue Pantoprazole 40 mg daily for total of 8 weeks and then discontinue. 2.  Continue food elimination/journal to see if this helps with pain. 3.  Return to clinic as needed.  Ellouise Newer, PA-C [facility] 11/29/2021, 1:27 PM  Cc: Glenis Smoker, *

## 2021-11-29 NOTE — Progress Notes (Signed)
Reviewed.  Monica Daniels L. Monica Lisby, MD, MPH  

## 2021-11-30 ENCOUNTER — Ambulatory Visit (INDEPENDENT_AMBULATORY_CARE_PROVIDER_SITE_OTHER): Payer: Federal, State, Local not specified - PPO | Admitting: Podiatry

## 2021-11-30 ENCOUNTER — Ambulatory Visit: Payer: Federal, State, Local not specified - PPO | Admitting: Physician Assistant

## 2021-11-30 DIAGNOSIS — E1151 Type 2 diabetes mellitus with diabetic peripheral angiopathy without gangrene: Secondary | ICD-10-CM | POA: Diagnosis not present

## 2021-11-30 DIAGNOSIS — I739 Peripheral vascular disease, unspecified: Secondary | ICD-10-CM

## 2021-11-30 DIAGNOSIS — L84 Corns and callosities: Secondary | ICD-10-CM

## 2021-11-30 NOTE — Progress Notes (Unsigned)
Subjective: Chief Complaint  Patient presents with   Callouses    Routine foot care, Callus     61 y.o. returns the office today for painful calluses to both of her feet. No open lesion she reports. She states her last A1c was.   She is on Plavix  PCP: Glenis Smoker, MD  Objective: AAO 3, NAD DP/PT pulses palpable 1/4, CRT less than 3 seconds Hyperkeratotic lesions bilateral submetatarsal 5 without any underlying ulceration, drainage, or signs of infection.  No open lesions or pre-ulcerative lesions are identified. No pain with calf compression, swelling, warmth, erythema.  Assessment: Patient presents with symptomatic onychomycosis, PAD  Plan: -Treatment options including alternatives, risks, complications were discussed -Sharply debrided the hyperkeratotic lesions x 2 without any complications or bleeding.  -Discussed daily foot inspection. If there are any changes, to call the office immediately.  -Follow-up in 3 months or sooner if any problems are to arise. In the meantime, encouraged to call the office with any questions, concerns, changes symptoms.  Celesta Gentile, DPM

## 2021-12-08 ENCOUNTER — Other Ambulatory Visit: Payer: Self-pay | Admitting: Gastroenterology

## 2021-12-09 DIAGNOSIS — G4733 Obstructive sleep apnea (adult) (pediatric): Secondary | ICD-10-CM | POA: Diagnosis not present

## 2021-12-21 DIAGNOSIS — H209 Unspecified iridocyclitis: Secondary | ICD-10-CM | POA: Diagnosis not present

## 2021-12-21 DIAGNOSIS — Z961 Presence of intraocular lens: Secondary | ICD-10-CM | POA: Diagnosis not present

## 2021-12-21 DIAGNOSIS — Z79899 Other long term (current) drug therapy: Secondary | ICD-10-CM | POA: Diagnosis not present

## 2021-12-21 DIAGNOSIS — H35373 Puckering of macula, bilateral: Secondary | ICD-10-CM | POA: Diagnosis not present

## 2021-12-21 DIAGNOSIS — E119 Type 2 diabetes mellitus without complications: Secondary | ICD-10-CM | POA: Diagnosis not present

## 2021-12-31 ENCOUNTER — Ambulatory Visit (HOSPITAL_COMMUNITY)
Admission: RE | Admit: 2021-12-31 | Discharge: 2021-12-31 | Disposition: A | Discharge status: Home / Self Care | Payer: Federal, State, Local not specified - PPO | Source: Ambulatory Visit | Attending: Cardiovascular Disease | Admitting: Cardiovascular Disease

## 2021-12-31 DIAGNOSIS — Z9862 Peripheral vascular angioplasty status: Secondary | ICD-10-CM | POA: Diagnosis not present

## 2022-01-08 DIAGNOSIS — G4733 Obstructive sleep apnea (adult) (pediatric): Secondary | ICD-10-CM | POA: Diagnosis not present

## 2022-02-08 DIAGNOSIS — G4733 Obstructive sleep apnea (adult) (pediatric): Secondary | ICD-10-CM | POA: Diagnosis not present

## 2022-02-23 ENCOUNTER — Other Ambulatory Visit: Payer: Self-pay | Admitting: Gastroenterology

## 2022-02-28 NOTE — Telephone Encounter (Signed)
Please submit PA.  Thank you

## 2022-03-03 ENCOUNTER — Ambulatory Visit: Payer: Federal, State, Local not specified - PPO | Admitting: Podiatry

## 2022-03-04 NOTE — Telephone Encounter (Signed)
Monica Daniels (Key: [id number]) PA Case ID #: [id number] Need Help? Call us at [phone] Outcome Approved today Your PA request has been approved. Additional information will be provided in the approval communication. (Message 1145) Authorization Expiration Date: 03/04/2023 Drug Pantoprazole Sodium 40MG dr tablets ePA cloud Child psychotherapist Electronic PA Form [phone] NCPDP)

## 2022-03-08 ENCOUNTER — Ambulatory Visit (INDEPENDENT_AMBULATORY_CARE_PROVIDER_SITE_OTHER): Payer: Federal, State, Local not specified - PPO | Admitting: Podiatry

## 2022-03-08 ENCOUNTER — Ambulatory Visit (INDEPENDENT_AMBULATORY_CARE_PROVIDER_SITE_OTHER): Payer: Federal, State, Local not specified - PPO

## 2022-03-08 DIAGNOSIS — S92515A Nondisplaced fracture of proximal phalanx of left lesser toe(s), initial encounter for closed fracture: Secondary | ICD-10-CM | POA: Diagnosis not present

## 2022-03-08 DIAGNOSIS — M79672 Pain in left foot: Secondary | ICD-10-CM

## 2022-03-08 NOTE — Progress Notes (Signed)
Subjective: Chief Complaint  Patient presents with   routine foot care     Patient 5th digit on left foot is tight and would like to have checked    62 year old female presents the office today for concerns of left fifth toe swelling, injury.  She states that she hit her toe last week she has had swelling and some discomfort.  No recent treatment.  No open lesions.  Objective: AAO x3, NAD DP/PT pulses palpable bilaterally, CRT less than 3 seconds Edema present to left fifth toe.  No open lesions noted.  The toe is in rectus position.  There is no tenderness to the metatarsal and the other digits.  MMT 5/5. No pain with calf compression, swelling, warmth, erythema  Assessment: Left fifth toe fracture  Plan: -All treatment options discussed with the patient including all alternatives, risks, complications.  -X-rays obtained reviewed.  3 views of the foot were obtained.  Fracture noted along the base of the proximal phalanx without displacement. -I think she will buddy splint the toe to help open toe rectus and help with swelling. Surgical shoe but ultimately we hold off on this.  Recommended wearing a stiffer soled shoe and limiting walking, activity. -Patient encouraged to call the office with any questions, concerns, change in symptoms.   Trula Slade DPM

## 2022-03-11 DIAGNOSIS — G4733 Obstructive sleep apnea (adult) (pediatric): Secondary | ICD-10-CM | POA: Diagnosis not present

## 2022-04-04 ENCOUNTER — Ambulatory Visit
Admission: EM | Admit: 2022-04-04 | Discharge: 2022-04-04 | Disposition: A | Discharge status: Home / Self Care | Payer: Federal, State, Local not specified - PPO | Attending: Internal Medicine | Admitting: Internal Medicine

## 2022-04-04 DIAGNOSIS — J069 Acute upper respiratory infection, unspecified: Secondary | ICD-10-CM | POA: Diagnosis not present

## 2022-04-04 MED ORDER — BENZONATATE 100 MG PO CAPS: 100.0000 mg | ORAL_CAPSULE | Freq: Three times a day (TID) | ORAL | 0 refills | Status: DC | PRN

## 2022-04-04 NOTE — ED Triage Notes (Signed)
Pt presents with non productive cough, congestion, and hoarseness for about a week with no relief with OTC medication.

## 2022-04-04 NOTE — Discharge Instructions (Signed)
It appears that you have a viral illness that should run its course and self resolve with the help of symptomatic treatment.  I have sent you medication to alleviate cough.  Ensure adequate fluid hydration and rest.  Follow-up if any symptoms persist or worsen.

## 2022-04-04 NOTE — ED Provider Notes (Signed)
[facility]    CSN: [id number] Arrival date & time: 04/04/22  1423      History   Chief Complaint Chief Complaint  Patient presents with   Cough    HPI Monica Daniels is a 62 y.o. female.   Patient presents with dry cough, nasal congestion, hoarseness that started about 7 days ago.  Patient reports that symptoms started with a sore throat which is now resolved.  Denies chest pain, shortness of breath, gastrointestinal symptoms.  Denies any known fevers or sick contacts.  Patient denies history of asthma or COPD.  Patient has taken Coricidin over-the-counter for symptoms.   Cough   Past Medical History:  Diagnosis Date   Allergy    CAD (coronary artery disease)    s/p overlapping DES x2 to proximal RCA and orbital atherectomy and DES x2 to proximal and mid LAD in 07/2019   Cataract    Diabetes mellitus without complication (HCC)    Glaucoma    Hyperlipidemia    Hypertension    PAD (peripheral artery disease) ([place])    s/p atherectomy and stenting of right SFA in 05/2020   Sleep apnea    uses cpap    Patient Active Problem List   Diagnosis Date Noted   Body mass index (BMI) 40.0-44.9, adult ([place]) 06/23/2020   Dependence on other enabling machines and devices 06/23/2020   Long term (current) use of insulin ([place]) 06/23/2020   Obstructive sleep apnea 06/23/2020   Polyneuropathy due to type 2 diabetes mellitus ([place]) 03/18/2020   Myopia with regular astigmatism and presbyopia 09/20/2019   Coronary artery disease 08/27/2019   Chest pain of uncertain etiology [id number]   Essential hypertension 06/07/2019   Peripheral arterial disease ([place]) 06/07/2019   Left carotid bruit 06/07/2019   Right hand pain 02/02/2016   HLD (hyperlipidemia) 06/25/2014   Non morbid obesity due to excess calories 06/25/2014   Type 2 diabetes mellitus without complication ([place]) 0000000    Past Surgical History:  Procedure Laterality Date   ABDOMINAL AORTOGRAM W/LOWER EXTREMITY  N/A 06/01/2020   Procedure: ABDOMINAL AORTOGRAM W/LOWER EXTREMITY;  Surgeon: Lorretta Harp, MD;  Location: [facility];  Service: Cardiovascular;  Laterality: N/A;   CARDIAC CATHETERIZATION     CATARACT EXTRACTION     CORONARY ATHERECTOMY N/A 08/15/2019   Procedure: CORONARY ATHERECTOMY;  Surgeon: Lorretta Harp, MD;  Location: [facility];  Service: Cardiovascular;  Laterality: N/A;   CORONARY STENT INTERVENTION N/A 08/15/2019   Procedure: CORONARY STENT INTERVENTION;  Surgeon: Lorretta Harp, MD;  Location: [facility];  Service: Cardiovascular;  Laterality: N/A;   HAND TENDON SURGERY     LEFT HEART CATH AND CORONARY ANGIOGRAPHY N/A 08/12/2019   Procedure: LEFT HEART CATH AND CORONARY ANGIOGRAPHY;  Surgeon: Lorretta Harp, MD;  Location: [facility];  Service: Cardiovascular;  Laterality: N/A;   PERIPHERAL VASCULAR ATHERECTOMY  06/01/2020   Procedure: PERIPHERAL VASCULAR ATHERECTOMY;  Surgeon: Lorretta Harp, MD;  Location: [facility];  Service: Cardiovascular;;  right SFA   PERIPHERAL VASCULAR INTERVENTION  06/01/2020   Procedure: PERIPHERAL VASCULAR INTERVENTION;  Surgeon: Lorretta Harp, MD;  Location: [facility];  Service: Cardiovascular;;  rt SFA    OB History   No obstetric history on file.      Home Medications    Prior to Admission medications   Medication Sig Start Date End Date Taking? Authorizing Provider  benzonatate (TESSALON) 100 MG capsule  Take 1 capsule (100 mg total) by mouth every 8 (eight) hours as needed for cough. 04/04/22  Yes Pebbles Zeiders, Michele Rockers, FNP  alum & mag hydroxide-simeth (MAALOX MAX) 400-400-40 MG/5ML suspension Take 10 mLs by mouth every 6 (six) hours as needed for indigestion. 08/27/21   Redwine, Madison A, PA-C  aspirin 81 MG EC tablet TAKE 1 TABLET BY MOUTH DAILY. SWALLOW WHOLE. Patient taking differently: Take 81 mg by mouth daily. 09/09/19   Lorretta Harp, MD  atorvastatin (LIPITOR) 80 MG  tablet Take 1 tablet (80 mg total) by mouth at bedtime. 08/24/21   Lorretta Harp, MD  B-D UF III MINI PEN NEEDLES 31G X 5 MM MISC USE TO INJECT LANTUS DAILY AS DIRECTED 03/04/19   [provider]  clopidogrel (PLAVIX) 75 MG tablet Take 1 tablet (75 mg total) by mouth daily. 08/30/21   Lorretta Harp, MD  cyanocobalamin 1000 MCG tablet Take 1,000 mcg by mouth daily with supper.    [provider]  ezetimibe (ZETIA) 10 MG tablet TAKE 1 TABLET BY MOUTH EVERY DAY 07/09/21   Lorretta Harp, MD  FARXIGA 10 MG TABS tablet Take 10 mg by mouth daily. 04/08/19   [provider]  Insulin Glargine (BASAGLAR KWIKPEN ) Inject 14 Units into the skin at bedtime. 09/23/21   [provider]  latanoprost (XALATAN) 0.005 % ophthalmic solution Place 1 drop into both eyes at bedtime. 07/06/20   [provider]  lisinopril (ZESTRIL) 10 MG tablet Take 10 mg by mouth daily. 05/13/19   [provider]  loratadine (CLARITIN) 10 MG tablet Take 10 mg by mouth daily.    [provider]  metFORMIN (GLUCOPHAGE-XR) 500 MG 24 hr tablet Take 1,000 mg by mouth 2 (two) times daily. 09/17/19   [provider]  Multiple Vitamin (MULTIVITAMIN WITH MINERALS) TABS tablet Take 1 tablet by mouth at bedtime.    [provider]  mycophenolate (CELLCEPT) 500 MG tablet Take by mouth. 08/11/20   [provider]  nitroGLYCERIN (NITROSTAT) 0.4 MG SL tablet Place 1 tablet (0.4 mg total) under the tongue every 5 (five) minutes as needed for chest pain. 07/20/21 07/20/22  Sande Rives E, PA-C  ONETOUCH VERIO test strip 3 (three) times daily. 08/13/19   [provider]  pantoprazole (PROTONIX) 40 MG tablet TAKE 1 TABLET BY MOUTH EVERY DAY 12/08/21   Thornton Park, MD    Family History Family History  Problem Relation Age of Onset   Hypertension Mother    Heart failure Mother    Diabetes Mother    Kidney cancer Mother    Diabetes Father     Multiple sclerosis Father    Colon cancer Neg Hx    Stomach cancer Neg Hx    Pancreatic cancer Neg Hx    Esophageal cancer Neg Hx    Liver disease Neg Hx     Social History Social History   Tobacco Use   Smoking status: Never   Smokeless tobacco: Never  Vaping Use   Vaping Use: Never used  Substance Use Topics   Alcohol use: No   Drug use: No     Allergies   Penicillins and Clindamycin/lincomycin   Review of Systems Review of Systems Per HPI  Physical Exam Triage Vital Signs ED Triage Vitals  Enc Vitals Group     BP 04/04/22 1608 (!) 151/83     Pulse Rate 04/04/22 1608 93     Resp 04/04/22 1608 17  Temp 04/04/22 1608 98.4 F (36.9 C)     Temp Source 04/04/22 1608 Oral     SpO2 04/04/22 1608 94 %     Weight --      Height --      Head Circumference --      Peak Flow --      Pain Score 04/04/22 1610 4     Pain Loc --      Pain Edu? --      Excl. in [place]? --    No data found.  Updated Vital Signs BP 117/72 (BP Location: Left Arm)   Pulse 93   Temp 98.4 F (36.9 C) (Oral)   Resp 17   LMP 12/31/2012   SpO2 98%   Visual Acuity Right Eye Distance:   Left Eye Distance:   Bilateral Distance:    Right Eye Near:   Left Eye Near:    Bilateral Near:     Physical Exam Constitutional:      General: She is not in acute distress.    Appearance: Normal appearance. She is not toxic-appearing or diaphoretic.  HENT:     Head: Normocephalic and atraumatic.     Right Ear: Tympanic membrane and ear canal normal.     Left Ear: Tympanic membrane and ear canal normal.     Nose: Congestion present.     Mouth/Throat:     Mouth: Mucous membranes are moist.     Pharynx: No posterior oropharyngeal erythema.  Eyes:     Extraocular Movements: Extraocular movements intact.     Conjunctiva/sclera: Conjunctivae normal.     Pupils: Pupils are equal, round, and reactive to light.  Cardiovascular:     Rate and Rhythm: Normal rate and regular rhythm.     Pulses: Normal  pulses.     Heart sounds: Normal heart sounds.  Pulmonary:     Effort: Pulmonary effort is normal. No respiratory distress.     Breath sounds: Normal breath sounds. No stridor. No wheezing, rhonchi or rales.  Abdominal:     General: Abdomen is flat. Bowel sounds are normal.     Palpations: Abdomen is soft.  Musculoskeletal:        General: Normal range of motion.     Cervical back: Normal range of motion.  Skin:    General: Skin is warm and dry.  Neurological:     General: No focal deficit present.     Mental Status: She is alert and oriented to person, place, and time. Mental status is at baseline.  Psychiatric:        Mood and Affect: Mood normal.        Behavior: Behavior normal.      UC Treatments / Results  Labs (all labs ordered are listed, but only abnormal results are displayed) Labs Reviewed - No data to display  EKG   Radiology No results found.  Procedures Procedures (including critical care time)  Medications Ordered in UC Medications - No data to display  Initial Impression / Assessment and Plan / UC Course  I have reviewed the triage vital signs and the nursing notes.  Pertinent labs & imaging results that were available during my care of the patient were reviewed by me and considered in my medical decision making (see chart for details).     Patient presents with symptoms likely from a viral upper respiratory infection. Do not suspect underlying cardiopulmonary process. Symptoms seem unlikely related to ACS, CHF or COPD exacerbations, pneumonia, pneumothorax.  Patient is nontoxic appearing and not in need of emergent medical intervention.  Viral testing deferred given duration of symptoms as it would not change treatment.  There are no adventitious lung sounds on exam or signs of respiratory compromise so do not think that chest imaging is necessary.  Recommended symptom control with medications and supportive care.  Patient was sent  prescriptions.  Return if symptoms fail to improve. Patient states understanding and is agreeable.  Discharged with PCP followup.  Final Clinical Impressions(s) / UC Diagnoses   Final diagnoses:  Viral upper respiratory tract infection with cough     Discharge Instructions      It appears that you have a viral illness that should run its course and self resolve with the help of symptomatic treatment.  I have sent you medication to alleviate cough.  Ensure adequate fluid hydration and rest.  Follow-up if any symptoms persist or worsen.    ED Prescriptions     Medication Sig Dispense Auth. Provider   benzonatate (TESSALON) 100 MG capsule Take 1 capsule (100 mg total) by mouth every 8 (eight) hours as needed for cough. 21 capsule [place], Michele Rockers, [place]      PDMP not reviewed this encounter.   Teodora Medici,  04/04/22 [phone]

## 2022-04-05 ENCOUNTER — Ambulatory Visit (INDEPENDENT_AMBULATORY_CARE_PROVIDER_SITE_OTHER): Payer: Federal, State, Local not specified - PPO

## 2022-04-05 ENCOUNTER — Ambulatory Visit: Payer: Federal, State, Local not specified - PPO | Admitting: Podiatry

## 2022-04-05 DIAGNOSIS — R52 Pain, unspecified: Secondary | ICD-10-CM

## 2022-04-05 DIAGNOSIS — S92515D Nondisplaced fracture of proximal phalanx of left lesser toe(s), subsequent encounter for fracture with routine healing: Secondary | ICD-10-CM | POA: Diagnosis not present

## 2022-04-06 DIAGNOSIS — R059 Cough, unspecified: Secondary | ICD-10-CM | POA: Diagnosis not present

## 2022-04-09 DIAGNOSIS — G4733 Obstructive sleep apnea (adult) (pediatric): Secondary | ICD-10-CM | POA: Diagnosis not present

## 2022-04-10 NOTE — Progress Notes (Signed)
Subjective: Chief Complaint  Patient presents with   Foot Pain    4 WEEK F/U TO SEE IF FOOT WAS HEALING, PAIN HAS SUBSIDED SINCE LAST WEEK     62 year old female presents the office today for follow-up evaluation of left fifth toe fracture.  She states that it feels that it is healing and the pain subsided last week.  No recent injury or changes otherwise.  She has had to stop wrapping the toe with the Coflex as it was causing irritation with the skin.  Objective: AAO x3, NAD DP/PT pulses palpable bilaterally, CRT less than 3 seconds No severe pain on the posterior.  Still some residual edema but there is no erythema or warmth.  There is no other areas of pinpoint tenderness.  Flexor, extensor tendons appear to be intact. 1 small scabbed area noted on the fourth toe without any drainage or pus or open lesion. No pain with calf compression, swelling, warmth, erythema  Assessment: Left fifth toe fracture-healing  Plan: -All treatment options discussed with the patient including all alternatives, risks, complications.  -X-rays obtained reviewed.  3 views of the foot were obtained.  Fracture noted along the base of the proximal phalanx without displacement.  Increased consolidation noted. -Discussed wearing supportive shoes and stiffer soled shoes to avoid pressure.  I think we can also hold off on splinting the toe to avoid any irritation to the skin. -Patient encouraged to call the office with any questions, concerns, change in symptoms.   Return if symptoms worsen or fail to improve.  Trula Slade DPM

## 2022-04-26 DIAGNOSIS — Z961 Presence of intraocular lens: Secondary | ICD-10-CM | POA: Diagnosis not present

## 2022-04-26 DIAGNOSIS — H209 Unspecified iridocyclitis: Secondary | ICD-10-CM | POA: Diagnosis not present

## 2022-04-26 DIAGNOSIS — E119 Type 2 diabetes mellitus without complications: Secondary | ICD-10-CM | POA: Diagnosis not present

## 2022-04-26 DIAGNOSIS — H35373 Puckering of macula, bilateral: Secondary | ICD-10-CM | POA: Diagnosis not present

## 2022-05-10 DIAGNOSIS — G4733 Obstructive sleep apnea (adult) (pediatric): Secondary | ICD-10-CM | POA: Diagnosis not present

## 2022-05-18 DIAGNOSIS — E119 Type 2 diabetes mellitus without complications: Secondary | ICD-10-CM | POA: Diagnosis not present

## 2022-05-18 DIAGNOSIS — E559 Vitamin D deficiency, unspecified: Secondary | ICD-10-CM | POA: Diagnosis not present

## 2022-06-02 DIAGNOSIS — E7800 Pure hypercholesterolemia, unspecified: Secondary | ICD-10-CM | POA: Diagnosis not present

## 2022-06-02 DIAGNOSIS — Z0000 Encounter for general adult medical examination without abnormal findings: Secondary | ICD-10-CM | POA: Diagnosis not present

## 2022-06-02 DIAGNOSIS — I10 Essential (primary) hypertension: Secondary | ICD-10-CM | POA: Diagnosis not present

## 2022-06-02 DIAGNOSIS — E1142 Type 2 diabetes mellitus with diabetic polyneuropathy: Secondary | ICD-10-CM | POA: Diagnosis not present

## 2022-06-09 DIAGNOSIS — G4733 Obstructive sleep apnea (adult) (pediatric): Secondary | ICD-10-CM | POA: Diagnosis not present

## 2022-06-15 ENCOUNTER — Ambulatory Visit: Payer: Federal, State, Local not specified - PPO | Attending: Physician Assistant | Admitting: Physician Assistant

## 2022-06-15 ENCOUNTER — Encounter: Payer: Self-pay | Admitting: Physician Assistant

## 2022-06-15 VITALS — BP 138/60 | HR 72 | Ht 66.0 in | Wt 260.0 lb

## 2022-06-15 DIAGNOSIS — E785 Hyperlipidemia, unspecified: Secondary | ICD-10-CM | POA: Diagnosis not present

## 2022-06-15 DIAGNOSIS — I10 Essential (primary) hypertension: Secondary | ICD-10-CM | POA: Diagnosis not present

## 2022-06-15 DIAGNOSIS — I739 Peripheral vascular disease, unspecified: Secondary | ICD-10-CM

## 2022-06-15 DIAGNOSIS — E119 Type 2 diabetes mellitus without complications: Secondary | ICD-10-CM

## 2022-06-15 DIAGNOSIS — I2510 Atherosclerotic heart disease of native coronary artery without angina pectoris: Secondary | ICD-10-CM | POA: Diagnosis not present

## 2022-06-15 DIAGNOSIS — Z7984 Long term (current) use of oral hypoglycemic drugs: Secondary | ICD-10-CM

## 2022-06-15 MED ORDER — CLOPIDOGREL BISULFATE 75 MG PO TABS: 75.0000 mg | ORAL_TABLET | Freq: Every day | ORAL | 3 refills | Status: DC

## 2022-06-15 NOTE — Patient Instructions (Signed)
Medication Instructions:  Your physician recommends that you continue on your current medications as directed. Please refer to the Current Medication list given to you today.  *If you need a refill on your cardiac medications before your next appointment, please call your pharmacy*    Follow-Up: At [facility], you and your health needs are our priority.  As part of our continuing mission to provide you with exceptional heart care, we have created designated Provider Care Teams.  These Care Teams include your primary Cardiologist (physician) and Advanced Practice Providers (APPs -  Physician Assistants and Nurse Practitioners) who all work together to provide you with the care you need, when you need it.  We recommend signing up for the patient portal called "MyChart".  Sign up information is provided on this After Visit Summary.  MyChart is used to connect with patients for Virtual Visits (Telemedicine).  Patients are able to view lab/test results, encounter notes, upcoming appointments, etc.  Non-urgent messages can be sent to your provider as well.   To learn more about what you can do with MyChart, go to [email or web address].    Your next appointment:   3 month(s)  Provider:   Azalee Course, PA-C

## 2022-06-15 NOTE — Progress Notes (Unsigned)
Cardiology Office Note:    Date:  06/16/2022   ID:  Monica Daniels, DOB 1960/09/13, MRN [id number]  PCP:  Shon Hale, MD   [facility] Providers Cardiologist:  [name], MD     Referring MD: Shon Hale, *   Chief Complaint  Patient presents with   Follow-up    Seen for Dr. Allyson Sabal    History of Present Illness:    Monica Daniels is a 62 y.o. female with a hx of CAD, hypertension, hyperlipidemia, DM2, PAD and history of OSA.  Patient had DES x 2 to proximal RCA and orbital atherectomy and DES to proximal and mid LAD in 2021 after abnormal coronary CT.  She developed lifestyle limiting claudication symptoms during cardiac rehab.  Lower extremity Doppler in February 2022 demonstrated right ABI 0.48, left ABI 0.84.  She underwent arthrectomy and stenting of 95% stenosis of right SFA in May 2022.  Postprocedure, her ABI improved from 0.48 to 0.94 and the claudication symptom resolved.  Most recent ABI and LEA obtained on 12/31/2021 demonstrated right ABI is 0.80 and left ABI 0.92.  Lower extremity arterial Doppler demonstrated 30 to 49% diffuse common femoral artery stenosis, 50 to 99% stenosis proximal to SFA stent, repeat study recommended in 12 months.  Most recent hemoglobin A1c was 8.4 on 05/18/2022.  She is being followed by endocrinology service at [facility].  Ms. [name] presented today for evaluation of right hip pain.  She says she recently started doing some floor exercises and tai chi.  After the exercise she has been noticing a right hip pain that lasted roughly 2 days.  Symptoms did not immediately resolve even after she stopped exercising.  She has not had any recurrent symptoms in the past week.  On physical exam, she has weak dorsalis pedis pulse on the right and also weak posterior tibial pulse on the right.  She has good pulse on the left side.  Her presentation is inconsistent was claudication.  I suspect her symptom is more musculoskeletal  associated with recent activity.  I encouraged her to continue exercising.  Will reassess her again in about 63-month.   Past Medical History:  Diagnosis Date   Allergy    CAD (coronary artery disease)    s/p overlapping DES x2 to proximal RCA and orbital atherectomy and DES x2 to proximal and mid LAD in 07/2019   Cataract    Diabetes mellitus without complication (HCC)    Glaucoma    Hyperlipidemia    Hypertension    PAD (peripheral artery disease) (HCC)    s/p atherectomy and stenting of right SFA in 05/2020   Sleep apnea    uses cpap    Past Surgical History:  Procedure Laterality Date   ABDOMINAL AORTOGRAM W/LOWER EXTREMITY N/A 06/01/2020   Procedure: ABDOMINAL AORTOGRAM W/LOWER EXTREMITY;  Surgeon: Runell Gess, MD;  Location: [facility] INVASIVE CV [facility];  Service: Cardiovascular;  Laterality: N/A;   CARDIAC CATHETERIZATION     CATARACT EXTRACTION     CORONARY ATHERECTOMY N/A 08/15/2019   Procedure: CORONARY ATHERECTOMY;  Surgeon: Runell Gess, MD;  Location: [facility];  Service: Cardiovascular;  Laterality: N/A;   CORONARY STENT INTERVENTION N/A 08/15/2019   Procedure: CORONARY STENT INTERVENTION;  Surgeon: Runell Gess, MD;  Location: [facility];  Service: Cardiovascular;  Laterality: N/A;   HAND TENDON SURGERY     LEFT HEART CATH AND CORONARY ANGIOGRAPHY N/A 08/12/2019   Procedure: LEFT HEART  CATH AND CORONARY ANGIOGRAPHY;  Surgeon: Runell Gess, MD;  Location: [facility] INVASIVE CV LAB;  Service: Cardiovascular;  Laterality: N/A;   PERIPHERAL VASCULAR ATHERECTOMY  06/01/2020   Procedure: PERIPHERAL VASCULAR ATHERECTOMY;  Surgeon: Runell Gess, MD;  Location: [facility] INVASIVE CV LAB;  Service: Cardiovascular;;  right SFA   PERIPHERAL VASCULAR INTERVENTION  06/01/2020   Procedure: PERIPHERAL VASCULAR INTERVENTION;  Surgeon: Runell Gess, MD;  Location: [facility] INV[facility] CV [facility];  Service: Cardiovascular;;  rt SFA    Current Medications: Current Meds   Medication Sig   aspirin 81 MG EC tablet TAKE 1 TABLET BY MOUTH DAILY. SWALLOW WHOLE. (Patient taking differently: Take 81 mg by mouth daily.)   atorvastatin (LIPITOR) 80 MG tablet Take 1 tablet (80 mg total) by mouth at bedtime.   B-D UF III MINI PEN NEEDLES 31G X 5 MM MISC USE TO INJECT LANTUS DAILY AS DIRECTED   cyanocobalamin 1000 MCG tablet Take 1,000 mcg by mouth daily with supper.   ezetimibe (ZETIA) 10 MG tablet TAKE 1 TABLET BY MOUTH EVERY DAY   FARXIGA 10 MG TABS tablet Take 10 mg by mouth daily.   Insulin Glargine (BASAGLAR KWIKPEN [place]) Inject 14 Units into the skin at bedtime.   latanoprost (XALATAN) 0.005 % ophthalmic solution Place 1 drop into both eyes at bedtime.   loratadine (CLARITIN) 10 MG tablet Take 10 mg by mouth daily.   losartan (COZAAR) 50 MG tablet Take 50 mg by mouth daily.   metFORMIN (GLUCOPHAGE-XR) 500 MG 24 hr tablet Take 1,000 mg by mouth 2 (two) times daily.   Multiple Vitamin (MULTIVITAMIN WITH MINERALS) TABS tablet Take 1 tablet by mouth at bedtime.   mycophenolate (CELLCEPT) 500 MG tablet Take by mouth.   nitroGLYCERIN (NITROSTAT) 0.4 MG SL tablet Place 1 tablet (0.4 mg total) under the tongue every 5 (five) minutes as needed for chest pain.   ONETOUCH VERIO test strip 3 (three) times daily.   pantoprazole (PROTONIX) 40 MG tablet TAKE 1 TABLET BY MOUTH EVERY DAY   [DISCONTINUED] clopidogrel (PLAVIX) 75 MG tablet Take 1 tablet (75 mg total) by mouth daily.   Current Facility-Administered Medications for the 06/15/22 encounter (Office Visit) with Azalee Course, PA  Medication   0.9 %  sodium chloride infusion     Allergies:   Penicillins and Clindamycin/lincomycin   Social History   Socioeconomic History   Marital status: Single    Spouse name: Not on file   Number of children: 0   Years of education: Not on file   Highest education level: Not on file  Occupational History   Occupation: Retired  Tobacco Use   Smoking status: Never   Smokeless  tobacco: Never  Vaping Use   Vaping Use: Never used  Substance and Sexual Activity   Alcohol use: No   Drug use: No   Sexual activity: Not Currently  Other Topics Concern   Not on file  Social History Narrative   Not on file   Social Determinants of Health   Financial Resource Strain: Not on file  Food Insecurity: Not on file  Transportation Needs: Not on file  Physical Activity: Not on file  Stress: Not on file  Social Connections: Not on file     Family History: The patient's family history includes Diabetes in her father and mother; Heart failure in her mother; Hypertension in her mother; Kidney cancer in her mother; Multiple sclerosis in her father. There is no history of Colon cancer, Stomach cancer,  Pancreatic cancer, Esophageal cancer, or Liver disease.  ROS:   Please see the history of present illness.     All other systems reviewed and are negative.  EKGs/Labs/Other Studies Reviewed:    The following studies were reviewed today:  Cath 08/15/2019 Prox RCA-1 lesion is 95% stenosed. Prox RCA-2 lesion is 95% stenosed. Prox LAD lesion is 95% stenosed. Prox LAD to Mid LAD lesion is 70% stenosed. Mid LAD lesion is 80% stenosed. A drug-eluting stent was successfully placed using a STENT RESOLUTE ONYX 2.75X26. Post intervention, there is a 0% residual stenosis. Post intervention, there is a 0% residual stenosis. A drug-eluting stent was successfully placed. Post intervention, there is a 0% residual stenosis. Post intervention, there is a 0% residual stenosis. A drug-eluting stent was successfully placed. Post intervention, there is a 0% residual stenosis.  EKG:  EKG is ordered today.  The ekg ordered today demonstrates normal sinus rhythm, no significant ST-T wave changes.  Nonspecific T wave changes.  Recent Labs: 08/27/2021: BUN 14; Creatinine, Ser 1.12; Hemoglobin 13.6; Platelets 301; Potassium 4.4; Sodium 134  Recent Lipid Panel    Component Value Date/Time    CHOL 125 11/19/2019 0954   TRIG 59 11/19/2019 0954   HDL 50 11/19/2019 0954   CHOLHDL 2.5 11/19/2019 0954   LDLCALC 62 11/19/2019 0954     Risk Assessment/Calculations:           Physical Exam:    VS:  BP 138/60   Pulse 72   Ht 5\' 6"  (1.676 m)   Wt 260 lb (117.9 kg)   LMP 12/31/2012   SpO2 96%   BMI 41.97 kg/m         Wt Readings from Last 3 Encounters:  06/15/22 260 lb (117.9 kg)  11/29/21 251 lb 9.6 oz (114.1 kg)  10/25/21 253 lb (114.8 kg)     GEN:  Well nourished, well developed in no acute distress HEENT: Normal NECK: No JVD; No carotid bruits LYMPHATICS: No lymphadenopathy CARDIAC: RRR, no murmurs, rubs, gallops RESPIRATORY:  Clear to auscultation without rales, wheezing or rhonchi  ABDOMEN: Soft, non-tender, non-distended MUSCULOSKELETAL:  No edema; No deformity  SKIN: Warm and dry NEUROLOGIC:  Alert and oriented x 3 PSYCHIATRIC:  Normal affect   ASSESSMENT:    1. Peripheral arterial disease (HCC)   2. Coronary artery disease involving native coronary artery of native heart without angina pectoris   3. Primary hypertension   4. Hyperlipidemia LDL goal <70   5. Controlled type 2 diabetes mellitus without complication, without long-term current use of insulin (HCC)    PLAN:    In order of problems listed above:  PAD: Patient presented today for evaluation of right hip pain.  The symptoms started after she started floor exercises and tai chi, symptoms did not go away for a few days even after she stopped exercising.  She previously underwent atherectomy and stenting of right SFA in May 2022.  Her current symptom is inconsistent with claudication and suggest musculoskeletal origin.  I recommended continue observation for now  CAD: Last PCI in 2021.  Continue aspirin and Plavix  Hypertension: Blood pressure stable  Hyperlipidemia: On Zetia and Lipitor  DM2: Managed by primary care provider.           Medication Adjustments/Labs and Tests  Ordered: Current medicines are reviewed at length with the patient today.  Concerns regarding medicines are outlined above.  Orders Placed This Encounter  Procedures   EKG 12-Lead   Meds ordered this  encounter  Medications   clopidogrel (PLAVIX) 75 MG tablet    Sig: Take 1 tablet (75 mg total) by mouth daily.    Dispense:  90 tablet    Refill:  3    Patient Instructions  Medication Instructions:  Your physician recommends that you continue on your current medications as directed. Please refer to the Current Medication list given to you today.  *If you need a refill on your cardiac medications before your next appointment, please call your pharmacy*    Follow-Up: At [facility], you and your health needs are our priority.  As part of our continuing mission to provide you with exceptional heart care, we have created designated Provider Care Teams.  These Care Teams include your primary Cardiologist (physician) and Advanced Practice Providers (APPs -  Physician Assistants and Nurse Practitioners) who all work together to provide you with the care you need, when you need it.  We recommend signing up for the patient portal called "MyChart".  Sign up information is provided on this After Visit Summary.  MyChart is used to connect with patients for Virtual Visits (Telemedicine).  Patients are able to view lab/test results, encounter notes, upcoming appointments, etc.  Non-urgent messages can be sent to your provider as well.   To learn more about what you can do with MyChart, go to [email or web address].    Your next appointment:   3 month(s)  Provider:   Azalee Course, PA-C      Signed, Azalee Course, [place]  06/16/2022 10:29 PM    [facility]

## 2022-06-16 ENCOUNTER — Encounter: Payer: Self-pay | Admitting: Physician Assistant

## 2022-07-10 DIAGNOSIS — G4733 Obstructive sleep apnea (adult) (pediatric): Secondary | ICD-10-CM | POA: Diagnosis not present

## 2022-07-17 ENCOUNTER — Other Ambulatory Visit: Payer: Self-pay | Admitting: Gastroenterology

## 2022-08-09 DIAGNOSIS — G4733 Obstructive sleep apnea (adult) (pediatric): Secondary | ICD-10-CM | POA: Diagnosis not present

## 2022-09-01 ENCOUNTER — Other Ambulatory Visit: Payer: Self-pay | Admitting: Cardiovascular Disease

## 2022-09-01 DIAGNOSIS — E782 Mixed hyperlipidemia: Secondary | ICD-10-CM

## 2022-09-09 DIAGNOSIS — G4733 Obstructive sleep apnea (adult) (pediatric): Secondary | ICD-10-CM | POA: Diagnosis not present

## 2022-09-13 DIAGNOSIS — H35373 Puckering of macula, bilateral: Secondary | ICD-10-CM | POA: Diagnosis not present

## 2022-09-13 DIAGNOSIS — H209 Unspecified iridocyclitis: Secondary | ICD-10-CM | POA: Diagnosis not present

## 2022-09-13 DIAGNOSIS — Z961 Presence of intraocular lens: Secondary | ICD-10-CM | POA: Diagnosis not present

## 2022-09-13 DIAGNOSIS — E119 Type 2 diabetes mellitus without complications: Secondary | ICD-10-CM | POA: Diagnosis not present

## 2022-09-15 ENCOUNTER — Ambulatory Visit: Payer: Federal, State, Local not specified - PPO | Attending: Physician Assistant | Admitting: Physician Assistant

## 2022-09-15 ENCOUNTER — Encounter: Payer: Self-pay | Admitting: Physician Assistant

## 2022-09-15 VITALS — BP 123/69 | HR 90 | Ht 66.0 in | Wt 257.6 lb

## 2022-09-15 DIAGNOSIS — I739 Peripheral vascular disease, unspecified: Secondary | ICD-10-CM | POA: Diagnosis not present

## 2022-09-15 DIAGNOSIS — E119 Type 2 diabetes mellitus without complications: Secondary | ICD-10-CM

## 2022-09-15 DIAGNOSIS — M25551 Pain in right hip: Secondary | ICD-10-CM | POA: Diagnosis not present

## 2022-09-15 DIAGNOSIS — I2510 Atherosclerotic heart disease of native coronary artery without angina pectoris: Secondary | ICD-10-CM

## 2022-09-15 DIAGNOSIS — Z794 Long term (current) use of insulin: Secondary | ICD-10-CM

## 2022-09-15 DIAGNOSIS — I10 Essential (primary) hypertension: Secondary | ICD-10-CM | POA: Diagnosis not present

## 2022-09-15 DIAGNOSIS — E785 Hyperlipidemia, unspecified: Secondary | ICD-10-CM

## 2022-09-15 MED ORDER — NITROGLYCERIN 0.4 MG SL SUBL: 0.4000 mg | SUBLINGUAL_TABLET | SUBLINGUAL | 4 refills | Status: DC | PRN

## 2022-09-15 NOTE — Progress Notes (Signed)
Cardiology Office Note:  .   Date:  09/17/2022  ID:  Monica Daniels, DOB [birth year], MRN [id number] PCP: Monica Hale, MD  [facility] Providers Cardiologist:  Monica Batty, MD     History of Present Illness: .   Monica Daniels is a 62 y.o. female with a hx of CAD, HTN, HLD, DM2, PAD and history of OSA.  Patient had DES x 2 to proximal RCA and orbital atherectomy and DES to proximal and mid LAD in 2021 after abnormal coronary CT.  She developed lifestyle limiting claudication symptoms during cardiac rehab.  Lower extremity Doppler in February 2022 demonstrated right ABI 0.48, left ABI 0.84.  She underwent arthrectomy and stenting of 95% stenosis of right SFA in May 2022.  Postprocedure, her ABI improved from 0.48 to 0.94 and the claudication symptom resolved.  Most recent ABI and LEA obtained on 12/31/2021 demonstrated right ABI is 0.80 and left ABI 0.92.  Lower extremity arterial Doppler demonstrated 30 to 49% diffuse common femoral artery stenosis, 50 to 99% stenosis proximal to SFA stent, repeat study recommended in 12 months.  Most recent hemoglobin A1c was 8.4 on 05/18/2022.  She is being followed by endocrinology service at [facility].   I last saw the patient in May 2024 at which time she presented for evaluation of right hip pain that started after doing some floor exercises and tai chi.  Symptom lasted roughly 2 days, her symptoms did not immediately resolve even after she stopped exercising.  By the time I saw her, she has been symptom-free for 1 week.  She had a good lower extremity pulse.  Her symptom was inconsistent with claudication, therefore recommended continue exercising.  She presents today for reassessment.  Patient presents today for follow-up.  She still occasionally has some right hip pain when she first wake up in the morning, however this does not occur with physical activity or when she walks around.  The symptom is inconsistent with claudication.  She does have  calf pain when she walks a long distance however she can still walk for three-quarter of a mile without any issue.  Will continue to observe her symptom.  She is due for ABI/LEA in December.  She can follow-up with Dr. Allyson Daniels in 35-month.  If she does have a 1/6 heart murmur on physical exam, however this is fairly mild.  She does not have any worsening dyspnea.  ROS:   She denies chest pain, palpitations, dyspnea, pnd, orthopnea, n, v, dizziness, syncope, edema, weight gain, or early satiety. All other systems reviewed and are otherwise negative except as noted above.    Studies Reviewed: .        Cardiac Studies & Procedures   CARDIAC CATHETERIZATION  CARDIAC CATHETERIZATION 08/15/2019  Narrative Images from the original result were not included.   Prox RCA-1 lesion is 95% stenosed.  Prox RCA-2 lesion is 95% stenosed.  Prox LAD lesion is 95% stenosed.  Prox LAD to Mid LAD lesion is 70% stenosed.  Mid LAD lesion is 80% stenosed.  A drug-eluting stent was successfully placed using a STENT RESOLUTE ONYX 2.75X26.  Post intervention, there is a 0% residual stenosis.  Post intervention, there is a 0% residual stenosis.  A drug-eluting stent was successfully placed.  Post intervention, there is a 0% residual stenosis.  Post intervention, there is a 0% residual stenosis.  A drug-eluting stent was successfully placed.  Post intervention, there is a 0% residual stenosis.  Monica Daniels is a 62  y.o. female   [id number] LOCATION:  FACILITY: [facility] PHYSICIAN: Monica Daniels, M.D. 1960/10/08   DATE OF PROCEDURE:  08/15/2019  DATE OF DISCHARGE:     CARDIAC CATHETERIZATION / PCI DES RCA/CSI DES LAD    History obtained from chart review.Monica Daniels is a 62 y.o.   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  She is referred by her podiatrist, Dr. Ernestene Daniels, for peripheral  vascular valuation because of an ulcer on her right first metatarsal head.  I last saw her in the office 06/07/2019. Her cardiac risk factors are notable for treated hypertension, diabetes and hyperlipidemia.  Unfortunately her hemoglobin A1c's have remained above 8.  She does have a family history for heart disease in his mother who had CABG.  Her mother, Monica Daniels, was a patient of mine as well who died in Mar 31, 2018.  She is never had a heart attack or stroke.  She denies chest pain or shortness of breath, but does sometimes have burning in her chest which she is attributed to reflux.  She does not exercise much but denies claudication as well.  Her wound on her right great toe has since healed.  Her Doppler studies performed 06/06/2019 revealed a right ABI 0.75 and a left of 0.82 with a high-frequency signal in her distal right SFA as well as tibial vessel disease.  She does complain now of right calf claudication.  Because of her complaints of chest pain I performed a coronary CTA with FFR analysis 08/01/2019 suggesting at least two-vessel disease.  Based on this I recommend that we proceed with outpatient diagnostic coronary angiography.  She had a diagnostic cath performed on 08/12/2019 revealing high-grade tandem proximal dominant RCA stenoses and high-grade calcified proximal LAD disease with mid LAD disease as well.  She presents today for RCA stenting, LAD orbital atherectomy, PCI drug-eluting stenting.  She was started on Plavix on Monday and she has taken it every day since.   PROCEDURE DESCRIPTION:  The patient was brought to the second floor [place] [facility] cath lab in the postabsorptive state.  She was premedicated with IV Versed and fentanyl.  Her right wristwas prepped and shaved in usual sterile fashion. Xylocaine 1% was used for local anesthesia. A 6 French sheath was inserted into the right radial artery using standard Seldinger technique.  Patient received 14,500's of heparin with an ACT  of over 300.  Isovue dye was used for the entirety of the case.  Left ventricular pressures monitored during the case.  The patient received radial cocktail.  Initially addressed the RCA using a 6 [place] hockey-stick guide catheter with sideholes.  A right Judkins damped on engagement of the ostium.  I crossed both lesions with a 0.14 Prowater wire, predilated with a 2 mm x 12 mm balloon in both the proximal lesions.  I then placed a 2.75 mm x 26 mm long resolute Onyx drug-eluting stent across both lesions, deployed at 14 atm.  I postdilated the stent with a 3 mm x 15 mm long noncompliant balloon up to 16 atm (3.1 mm resulting reduction of tandem 95% proximal dominant RCA stenoses to 0% residual.  Following this I dressed the LAD using a 6 French XB LAD 3.5 similar guide catheter.  I crossed the 95 to 99% calcified proximal LAD stenosis with a 0.14/300 cm length prowater and exchanged for a Viper using a TelePort catheter.  I  then performed orbital atherectomy with a 1.25 micro bur 60,000 RPMs.  I performed 3 passes.  I then exchanged the Viper wire using the teleport for the previously used prowater.  I predilated the distal LAD lesion with a 2 mm x 12 mm balloon and placed a 2.25 mm x 12 mm long resolute Onyx stent across the lesion deployed at 14 atm.  There was a "waist".  Following this I placed a 3 mm x 18 mm long resolute Onyx drug-eluting stent across the proximal LAD stenosis and postdilated with a 3.25 mm x 12 mm balloon.  There was a 70% stenosis just distal to this which I primarily stented with a 2.75 mm x 12 mm long resolute Onyx drug-eluting stent up to 16 atm and postdilated the overlap region with the same 3.25 mm balloon up to 16 atm resulting reduction of proximal 95 to 99% calcified LAD lesion is 0% residual and 80% mid LAD lesion to 0% residual.  The patient tolerated the procedure well.  The radial sheath was removed and a TR band was placed on the right wrist to achieve patent hemostasis.   The patient left lab in stable condition.  Impression Successful PCI and drug-eluting stent of the proximal tandem RCA lesions using resolute Onyx drug-eluting stent postdilated to 3.1 mm.  Successful orbital atherectomy of a high-grade calcified proximal LAD lesion with subsequent stenting of the mid LAD and proximal LAD using 3 resolute Onyx drug-eluting stents.  Patient tolerated procedure well.  She was already on aspirin Plavix and did receive an additional 300 mg of p.o. Plavix.  She will be discharged home today as an outpatient and will arrange follow-up with me in 2 weeks.  Monica Daniels. MD, [facility] 08/15/2019 11:49 AM  Findings Coronary Findings Diagnostic  Dominance: Right  Left Anterior Descending Prox LAD lesion is 95% stenosed. The lesion is calcified. Prox LAD to Mid LAD lesion is 70% stenosed. Mid LAD lesion is 80% stenosed.  Right Coronary Artery Prox RCA-1 lesion is 95% stenosed. Prox RCA-2 lesion is 95% stenosed. The lesion is calcified.  Intervention  Prox LAD lesion Stent (Also treats lesions: Prox LAD to Mid LAD) A drug-eluting stent was successfully placed. Stent strut is well apposed. Post-stent angioplasty was performed. Post-Intervention Lesion Assessment The intervention was successful. Pre-interventional TIMI flow is 3. Post-intervention TIMI flow is 3. No complications occurred at this lesion. There is a 0% residual stenosis post intervention.  Prox LAD to Mid LAD lesion Stent (Also treats lesions: Prox LAD) See details in Prox LAD lesion. Post-Intervention Lesion Assessment The intervention was successful. Pre-interventional TIMI flow is 3. Post-intervention TIMI flow is 3. No complications occurred at this lesion. There is a 0% residual stenosis post intervention.  Mid LAD lesion Stent A drug-eluting stent was successfully placed. Post-Intervention Lesion Assessment The intervention was successful. Pre-interventional TIMI flow is 3.  Post-intervention TIMI flow is 3. No complications occurred at this lesion. There is a 0% residual stenosis post intervention.  Prox RCA-1 lesion Stent (Also treats lesions: Prox RCA-2) A drug-eluting stent was successfully placed using a STENT RESOLUTE ONYX 2.75X26. Stent strut is well apposed. Stent does not overlap previously placed stentPost-stent angioplasty was performed. Post-Intervention Lesion Assessment The intervention was successful. Pre-interventional TIMI flow is 3. Post-intervention TIMI flow is 3. No complications occurred at this lesion. There is a 0% residual stenosis post intervention.  Prox RCA-2 lesion Stent (Also treats lesions: Prox RCA-1) See details in Prox RCA-1 lesion. Post-Intervention Lesion Assessment The  intervention was successful. Pre-interventional TIMI flow is 3. Post-intervention TIMI flow is 3. No complications occurred at this lesion. There is a 0% residual stenosis post intervention.   CARDIAC CATHETERIZATION  CARDIAC CATHETERIZATION 08/12/2019  Narrative Images from the original result were not included.   Prox RCA-1 lesion is 95% stenosed.  Prox RCA-2 lesion is 95% stenosed.  Mid RCA lesion is 30% stenosed.  Prox LAD lesion is 90% stenosed.  Mid LAD to Dist LAD lesion is 90% stenosed.  The left ventricular systolic function is normal.  LV end diastolic pressure is normal.  The left ventricular ejection fraction is 55-65% by visual estimate.  Monica Daniels is a 62 y.o. female   [id number] LOCATION:  FACILITY: [facility] PHYSICIAN: Monica Daniels, M.D. 02/05/1960   DATE OF PROCEDURE:  08/12/2019  DATE OF DISCHARGE:     CARDIAC CATHETERIZATION    History obtained from chart review.Monica Daniels is a 62 y.o.   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  She is referred by her podiatrist, Dr. Ernestene Daniels, for peripheral vascular  valuation because of an ulcer on her right first metatarsal head.  I last saw her in the office 06/07/2019. Her cardiac risk factors are notable for treated hypertension, diabetes and hyperlipidemia.  Unfortunately her hemoglobin A1c's have remained above 8.  She does have a family history for heart disease in his mother who had CABG.  Her mother, Audrieanna Worthing, was a patient of mine as well who died in 03-26-2018.  She is never had a heart attack or stroke.  She denies chest pain or shortness of breath, but does sometimes have burning in her chest which she is attributed to reflux.  She does not exercise much but denies claudication as well.  Her wound on her right great toe has since healed.  Her Doppler studies performed 06/06/2019 revealed a right ABI 0.75 and a left of 0.82 with a high-frequency signal in her distal right SFA as well as tibial vessel disease.  She does complain now of right calf claudication.  Because of her complaints of chest pain I performed a coronary CTA with FFR analysis 08/01/2019 suggesting at least two-vessel disease.  Based on this I recommend that we proceed with outpatient diagnostic coronary angiography.  I have asked her to begin taking a baby aspirin.  Impression Ms. Stinnette has severe two-vessel disease with tandem lesions in her proximal RCA that were calcified and her proximal LAD and mid to distal LAD as well.  She has normal LV function.  These correlate exactly to the findings and coronary CTA.  She will need staged orbital atherectomy of her RCA and LAD.  The sheath was removed and a TR band was placed on the right wrist to achieve patent hemostasis.  The patient left lab in stable condition.  She will be discharged home today with follow-up with me in the office tomorrow with the intent to schedule her atherectomy this coming Thursday.  I did load her with Plavix today as well.  Monica Daniels. MD, [facility] 08/12/2019 12:50 PM  Findings Coronary Findings Diagnostic   Dominance: Right  Left Anterior Descending Prox LAD lesion is 90% stenosed. The lesion is calcified. Mid LAD to Dist LAD lesion is 90% stenosed.  Right Coronary Artery Prox RCA-1 lesion is 95% stenosed. The lesion is calcified. Prox RCA-2 lesion is 95% stenosed. The lesion is calcified. Mid RCA lesion  is 30% stenosed.  Intervention  No interventions have been documented.        CT SCANS  CT CORONARY MORPH W/CTA COR W/SCORE 08/01/2019  Addendum 08/01/2019  5:47 PM ADDENDUM REPORT: 08/01/2019 17:45  CLINICAL DATA:  Chest pain  EXAM: Cardiac/Coronary CTA  TECHNIQUE: The patient was scanned on a [employer]. A 100 kV prospective scan was triggered in the descending thoracic aorta at 111 HU's. Axial non-contrast 3 mm slices were carried out through the heart. The data set was analyzed on a dedicated work station and scored using the Agatson method. Gantry rotation speed was 250 msecs and collimation was .6 mm. No beta blockade and 0.8 mg of sl NTG was given. The 3D data set was reconstructed in 5% intervals of the 35-75 % of the R-R cycle. Diastolic phases were analyzed on a dedicated work station using MPR, MIP and VRT modes. The patient received 80 cc of contrast.  FINDINGS: Image quality: Average.  Noise artifact is: Limited.  Coronary Arteries:  Normal coronary origin.  Right dominance.  Left main: The left main is a large caliber vessel with a normal take off from the left coronary cusp that bifurcates to form a left anterior descending artery and a left circumflex artery. There is minimal calcified plaque (<25%).  Left anterior descending artery: The proximal LAD contains moderate mixed density plaque (50-69%). The mid LAD contains moderate mixed density plaque (50-69%). 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 This segment is not interpretable.  Left circumflex artery: The LCX is non-dominant. The proximal LCX contains mild  calcified plaque (25-49%). The mid to distal LCX contains mild non-calcified plaque (25-49%). OM1 is diffusely diseased with calcified plaque of indeterminate stenosis. The LCX terminates as a small OM2 branch with mild non-calcified plaque (25-49%).  Right coronary artery: The RCA is dominant with normal take off from the right coronary cusp. There is concerns for a severe stenosis of mixed density plaque in the proximal RCA (70-99%). The mid and distal RCA segments contain mild calcified plaque (25-49%). The RCA terminates as a PDA with mild calcified plaque (25-49%).  Right Atrium: Right atrial size is within normal limits.  Right Ventricle: The right ventricular cavity is within normal limits.  Left Atrium: Left atrial size is normal in size with no left atrial appendage filling defect. A small PFO is present.  Left Ventricle: The ventricular cavity size is within normal limits. There are no stigmata of prior infarction. There is no abnormal filling defect.  Pulmonary arteries: Normal in size without proximal filling defect.  Pulmonary veins: Normal pulmonary venous drainage.  Pericardium: Normal thickness with no significant effusion or calcium present.  Cardiac valves: The aortic valve is trileaflet with trace calcifications. The mitral valve is normal structure without significant calcification.  Aorta: Normal caliber with no significant disease.  Extra-cardiac findings: See attached radiology report for non-cardiac structures.  IMPRESSION: 1. Coronary calcium score of 1103. This was 99th percentile for age and sex matched control.  2. Normal coronary origin with right dominance.  3. 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  4. Concerns for moderate stenoses in the proximal and mid LAD (50-69%).  5. Extensive bridging of the mid to distal LAD that is not interpretable.  6. Small PFO.  RECOMMENDATIONS: 1. Cardiac catheterization is recommended. CT  FFR will be submitted for the LAD.  Lennie Odor, MD   Electronically Signed By: Lennie Odor On: 08/01/2019 17:45  Narrative  EXAM: OVER-READ INTERPRETATION  CT CHEST  The following report is an over-read performed by radiologist Dr. Trudie Reed of [facility] on 08/01/2019. This over-read does not include interpretation of cardiac or coronary anatomy or pathology. The coronary calcium score/coronary CTA interpretation by the cardiologist is attached.  COMPARISON:  None.  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 Within the visualized portions of the thorax there are no suspicious appearing pulmonary nodules or masses, there is no acute consolidative airspace disease, no pleural effusions, no pneumothorax and no lymphadenopathy. Visualized portions of the upper abdomen are unremarkable. There are no aggressive appearing lytic or blastic lesions noted in the visualized portions of the skeleton.  IMPRESSION: 1. There appears to be a nonocclusive likely chronic thrombus in a segmental sized branch to the anterobasal segment of the left lower lobe.  Electronically Signed: By: Trudie Reed M.D. On: 08/01/2019 14:21   CT SCANS  CT CARDIAC SCORING (SELF PAY ONLY) 06/14/2019  Addendum 06/14/2019  2:06 PM ADDENDUM REPORT: 06/14/2019 14:04  CLINICAL DATA:  Risk stratification  EXAM: Coronary Calcium Score  TECHNIQUE: The patient was scanned on a [employer] scanner. Axial non-contrast 3 mm slices were carried out through the heart. The data set was analyzed on a dedicated work station and scored using the Agatson method.  FINDINGS: Non-cardiac: See separate report from [facility].  Ascending Aorta: Atherosclerosis.  Aortic leaflet  calcification.  Pericardium: Normal  Coronary arteries: Normal origins. Heavy multivessel coronary calcium.  IMPRESSION: Coronary calcium score of 1138. This was 99th percentile for age and sex matched control.  Aggressive risk factor modification and further coronary evaluation is recommended.   Electronically Signed By: Chrystie Nose M.D. On: 06/14/2019 14:04  Narrative EXAM: OVER-READ INTERPRETATION  CT CHEST  The following report is an over-read performed by radiologist Dr. Jeronimo Greaves of [facility] on 06/14/2019. This over-read does not include interpretation of cardiac or coronary anatomy or pathology. The calcium score interpretation by the cardiologist is attached.  COMPARISON:  None.  FINDINGS: Vascular: Normal aortic caliber.  Aortic atherosclerosis.  Mediastinum/Nodes: No imaged thoracic adenopathy. Mild esophageal dilatation with fluid level within. Example 7/4.  Lungs/Pleura: No pleural fluid. Clear imaged lungs.  Upper Abdomen: Moderate hepatic steatosis.  Musculoskeletal: No acute osseous abnormality.  IMPRESSION: 1.  No acute findings in the imaged extracardiac chest. 2.  Aortic Atherosclerosis (ICD10-I70.0). 3. Esophageal air fluid level suggests dysmotility or gastroesophageal reflux. 4. Hepatic steatosis.  Electronically Signed: By: Jeronimo Greaves M.D. On: 06/14/2019 13:26          Risk Assessment/Calculations:            Physical Exam:   VS:  BP 123/69 (BP Location: Left Arm, Patient Position: Sitting, Cuff Size: Large)   Pulse 90   Ht 5\' 6"  (1.676 m)   Wt 257 lb 9.6 oz (116.8 kg)   LMP 12/31/2012   SpO2 99%   BMI 41.58 kg/m    Wt Readings from Last 3 Encounters:  09/15/22 257 lb 9.6 oz (116.8 kg)  06/15/22 260 lb (117.9 kg)  11/29/21 251 lb 9.6 oz (114.1 kg)    GEN: Well nourished, well developed in no acute distress NECK: No JVD; No carotid bruits CARDIAC: RRR, no murmurs, rubs, gallops RESPIRATORY:  Clear  to auscultation without rales, wheezing or rhonchi  ABDOMEN: Soft, non-tender, non-distended EXTREMITIES:  No edema; No deformity   ASSESSMENT AND PLAN: .    Right hip pain: Likely more related to arthritis or sciatic nerve rather than vascular issue.  Symptom does not worsen with physical activity.  PAD: Due for ABI and LEA in December  CAD: Denies any chest pain.  Aspirin and Plavix  Hypertension: Blood pressure well-controlled  Hyperlipidemia: On Lipitor  DM2: Managed by primary care provider.       Dispo: Follow-up in 76-month  Signed, Azalee Course, [place]

## 2022-09-15 NOTE — Patient Instructions (Addendum)
Medication Instructions:   No changes  *If you need a refill on your cardiac medications before your next appointment, please call your pharmacy*   Lab Work: Not needed    Testing/Procedures: Will be schedule a [address]- Dec 2024 Your physician has requested that you have an ankle brachial index (ABI). During this test an ultrasound and blood pressure cuff are used to evaluate the arteries that supply the arms and legs with blood. Allow thirty minutes for this exam. There are no restrictions or special instructions.  And Your physician has requested that you have a lower extremity arterial duplex. This test is an ultrasound of the arteries in the legs. It looks at arterial blood flow in the legs. Allow one hour for Lower Arterial scans. There are no restrictions or special instructions   Follow-Up: At [facility], you and your health needs are our priority.  As part of our continuing mission to provide you with exceptional heart care, we have created designated Provider Care Teams.  These Care Teams include your primary Cardiologist (physician) and Advanced Practice Providers (APPs -  Physician Assistants and Nurse Practitioners) who all work together to provide you with the care you need, when you need it.     Your next appointment:   6 month(s)  The format for your next appointment:   In Person  Provider:   [name], MD

## 2022-10-02 ENCOUNTER — Other Ambulatory Visit: Payer: Self-pay | Admitting: Cardiovascular Disease

## 2022-10-10 DIAGNOSIS — G4733 Obstructive sleep apnea (adult) (pediatric): Secondary | ICD-10-CM | POA: Diagnosis not present

## 2022-11-09 DIAGNOSIS — G4733 Obstructive sleep apnea (adult) (pediatric): Secondary | ICD-10-CM | POA: Diagnosis not present

## 2022-11-23 DIAGNOSIS — E119 Type 2 diabetes mellitus without complications: Secondary | ICD-10-CM | POA: Diagnosis not present

## 2022-12-06 DIAGNOSIS — I10 Essential (primary) hypertension: Secondary | ICD-10-CM | POA: Diagnosis not present

## 2022-12-06 DIAGNOSIS — E7800 Pure hypercholesterolemia, unspecified: Secondary | ICD-10-CM | POA: Diagnosis not present

## 2022-12-06 DIAGNOSIS — I739 Peripheral vascular disease, unspecified: Secondary | ICD-10-CM | POA: Diagnosis not present

## 2022-12-06 DIAGNOSIS — E1142 Type 2 diabetes mellitus with diabetic polyneuropathy: Secondary | ICD-10-CM | POA: Diagnosis not present

## 2022-12-06 DIAGNOSIS — Z23 Encounter for immunization: Secondary | ICD-10-CM | POA: Diagnosis not present

## 2022-12-28 ENCOUNTER — Ambulatory Visit (HOSPITAL_COMMUNITY)
Admission: RE | Admit: 2022-12-28 | Discharge: 2022-12-28 | Disposition: A | Discharge status: Home / Self Care | Payer: Federal, State, Local not specified - PPO | Source: Ambulatory Visit | Attending: Physician Assistant | Admitting: Physician Assistant

## 2022-12-28 DIAGNOSIS — M25551 Pain in right hip: Secondary | ICD-10-CM | POA: Diagnosis not present

## 2022-12-28 DIAGNOSIS — I739 Peripheral vascular disease, unspecified: Secondary | ICD-10-CM | POA: Insufficient documentation

## 2022-12-28 LAB — VAS US ABI WITH/WO TBI
Left ABI: 0.85
Right ABI: 0.72

## 2023-01-04 ENCOUNTER — Other Ambulatory Visit: Payer: Self-pay | Admitting: Physician Assistant

## 2023-01-13 ENCOUNTER — Ambulatory Visit: Payer: Federal, State, Local not specified - PPO | Attending: Physician Assistant | Admitting: Physician Assistant

## 2023-01-13 ENCOUNTER — Encounter: Payer: Self-pay | Admitting: Physician Assistant

## 2023-01-13 VITALS — BP 143/67 | HR 69 | Ht 66.0 in | Wt 261.4 lb

## 2023-01-13 DIAGNOSIS — I2510 Atherosclerotic heart disease of native coronary artery without angina pectoris: Secondary | ICD-10-CM

## 2023-01-13 DIAGNOSIS — I739 Peripheral vascular disease, unspecified: Secondary | ICD-10-CM

## 2023-01-13 DIAGNOSIS — I10 Essential (primary) hypertension: Secondary | ICD-10-CM

## 2023-01-13 DIAGNOSIS — E785 Hyperlipidemia, unspecified: Secondary | ICD-10-CM | POA: Diagnosis not present

## 2023-01-13 NOTE — Progress Notes (Unsigned)
Cardiology Office Note:  .   Date:  01/14/2023  ID:  [name], DOB 1960/02/07, MRN [id number] PCP: Shon Hale, MD  [facility] Providers Cardiologist:  [name], MD     History of Present Illness: .   Marlys Gollehon is a 62 y.o. female with past medical history of CAD, HTN, HLD, DM2, PAD and history of OSA.  Patient had DES x 2 to proximal RCA and orbital atherectomy and DES to proximal and mid LAD in 2021 after abnormal coronary CT.  She developed lifestyle limiting claudication symptoms during cardiac rehab.  Lower extremity Doppler in February 2022 demonstrated right ABI 0.48, left ABI 0.84.  She underwent arthrectomy and stenting of 95% stenosis of right SFA in May 2022.  Postprocedure, her ABI improved from 0.48 to 0.94 and the claudication symptom resolved.  Repeat  ABI and LEA obtained on 12/31/2021 demonstrated right ABI is 0.80 and left ABI 0.92.  Lower extremity arterial Doppler demonstrated 30 to 49% diffuse common femoral artery stenosis, 50 to 99% stenosis proximal to SFA stent, repeat study recommended in 12 months.  Hemoglobin A1c was 8.4 on 05/18/2022.  She is being followed by endocrinology service at [facility].   I saw the patient in May 2020 for for evaluation of right hip pain that started after subfloor exercise at tai chi.  Symptoms was persisted for 2 days.  Symptoms did not immediately resolve even after she stopped exercising.  By the time I saw her, she has been symptom-free for 1 week, she had good lower extremity pulses, therefore her symptom was inconsistent with a claudication that I recommended continue exercising.  I last saw her in August, she still occasionally has some right hip pain when she first wakes up in the morning however this does not occur with physical activity and it when she walks around.  Symptom is still consistent with claudication.  She has calf pain when she walks a long distance however she can still walk for three quarters  of a mile without any issue.  Repeat ABI in December 2024 showed right ABI 0.72, slightly down from the previous 0.80.  Left ABI 0.85, slightly down from the previous 0.92.  Arterial ultrasound shows 75 to 99% stenosis proximal to the right SFA stent, velocity has increased since the prior exam.  Patient presents today for earlier visit.  Talking with the patient, she denies any claudication symptom.  We discussed the recent report, especially the increased velocity proximal to her right SFA stent.  Given lack of claudication symptom, we opted to continue observation.  LDL was fairly controlled at 62 on 80 mg Lipitor and Zetia.  I recommended 38-month follow-up with Dr. Allyson Sabal.  She denies any chest pain or shortness of breath.  She is aware to contact cardiology service if she started having increasing claudication symptom.    ROS:   She denies chest pain, palpitations, dyspnea, pnd, orthopnea, n, v, dizziness, syncope, edema, weight gain, or early satiety. All other systems reviewed and are otherwise negative except as noted above.    Studies Reviewed: .        Cardiac Studies & Procedures   CARDIAC CATHETERIZATION  CARDIAC CATHETERIZATION 08/15/2019  Narrative Images from the original result were not included.   Prox RCA-1 lesion is 95% stenosed.  Prox RCA-2 lesion is 95% stenosed.  Prox LAD lesion is 95% stenosed.  Prox LAD to Mid LAD lesion is 70% stenosed.  Mid LAD lesion is 80% stenosed.  A drug-eluting stent was successfully placed using a STENT RESOLUTE ONYX 2.75X26.  Post intervention, there is a 0% residual stenosis.  Post intervention, there is a 0% residual stenosis.  A drug-eluting stent was successfully placed.  Post intervention, there is a 0% residual stenosis.  Post intervention, there is a 0% residual stenosis.  A drug-eluting stent was successfully placed.  Post intervention, there is a 0% residual stenosis.  Ngozi Christina is a 62 y.o.  female   [id number] LOCATION:  FACILITY: [facility] PHYSICIAN: [name], M.D. 28-Mar-1960   DATE OF PROCEDURE:  08/15/2019  DATE OF DISCHARGE:     CARDIAC CATHETERIZATION / PCI DES RCA/CSI DES LAD    History obtained from chart review.Torry Banwart is a 62 y.o.   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  She is referred by her podiatrist, Dr. Ernestene Kiel, for peripheral vascular valuation because of an ulcer on her right first metatarsal head.  I last saw her in the office 06/07/2019. Her cardiac risk factors are notable for treated hypertension, diabetes and hyperlipidemia.  Unfortunately her hemoglobin A1c's have remained above 8.  She does have a family history for heart disease in his mother who had CABG.  Her mother, Rafeef Kornbluth, was a patient of mine as well who died in 05/04/2018.  She is never had a heart attack or stroke.  She denies chest pain or shortness of breath, but does sometimes have burning in her chest which she is attributed to reflux.  She does not exercise much but denies claudication as well.  Her wound on her right great toe has since healed.  Her Doppler studies performed 06/06/2019 revealed a right ABI 0.75 and a left of 0.82 with a high-frequency signal in her distal right SFA as well as tibial vessel disease.  She does complain now of right calf claudication.  Because of her complaints of chest pain I performed a coronary CTA with FFR analysis 08/01/2019 suggesting at least two-vessel disease.  Based on this I recommend that we proceed with outpatient diagnostic coronary angiography.  She had a diagnostic cath performed on 08/12/2019 revealing high-grade tandem proximal dominant RCA stenoses and high-grade calcified proximal LAD disease with mid LAD disease as well.  She presents today for RCA stenting, LAD orbital atherectomy, PCI drug-eluting stenting.  She was started on Plavix  on Monday and she has taken it every day since.   PROCEDURE DESCRIPTION:  The patient was brought to the second floor [facility] cath lab in the postabsorptive state.  She was premedicated with IV Versed and fentanyl.  Her right wristwas prepped and shaved in usual sterile fashion. Xylocaine 1% was used for local anesthesia. A 6 French sheath was inserted into the right radial artery using standard Seldinger technique.  Patient received 14,500's of heparin with an ACT of over 300.  Isovue dye was used for the entirety of the case.  Left ventricular pressures monitored during the case.  The patient received radial cocktail.  Initially addressed the RCA using a 6 [place] hockey-stick guide catheter with sideholes.  A right Judkins damped on engagement of the ostium.  I crossed both lesions with a 0.14 Prowater wire, predilated with a 2 mm x 12 mm balloon in both the proximal lesions.  I then placed a 2.75 mm x 26 mm long resolute Onyx drug-eluting stent across both lesions, deployed at 14 atm.  I postdilated the stent with a 3 mm x 15 mm long noncompliant balloon up to 16 atm (3.1 mm resulting reduction of tandem 95% proximal dominant RCA stenoses to 0% residual.  Following this I dressed the LAD using a 6 French XB LAD 3.5 similar guide catheter.  I crossed the 95 to 99% calcified proximal LAD stenosis with a 0.14/300 cm length prowater and exchanged for a Viper using a TelePort catheter.  I then performed orbital atherectomy with a 1.25 micro bur 60,000 RPMs.  I performed 3 passes.  I then exchanged the Viper wire using the teleport for the previously used prowater.  I predilated the distal LAD lesion with a 2 mm x 12 mm balloon and placed a 2.25 mm x 12 mm long resolute Onyx stent across the lesion deployed at 14 atm.  There was a "waist".  Following this I placed a 3 mm x 18 mm long resolute Onyx drug-eluting stent across the proximal LAD stenosis and postdilated with a 3.25 mm x 12 mm balloon.   There was a 70% stenosis just distal to this which I primarily stented with a 2.75 mm x 12 mm long resolute Onyx drug-eluting stent up to 16 atm and postdilated the overlap region with the same 3.25 mm balloon up to 16 atm resulting reduction of proximal 95 to 99% calcified LAD lesion is 0% residual and 80% mid LAD lesion to 0% residual.  The patient tolerated the procedure well.  The radial sheath was removed and a TR band was placed on the right wrist to achieve patent hemostasis.  The patient left lab in stable condition.  Impression Successful PCI and drug-eluting stent of the proximal tandem RCA lesions using resolute Onyx drug-eluting stent postdilated to 3.1 mm.  Successful orbital atherectomy of a high-grade calcified proximal LAD lesion with subsequent stenting of the mid LAD and proximal LAD using 3 resolute Onyx drug-eluting stents.  Patient tolerated procedure well.  She was already on aspirin Plavix and did receive an additional 300 mg of p.o. Plavix.  She will be discharged home today as an outpatient and will arrange follow-up with me in 2 weeks.  [name]. MD, [facility] 08/15/2019 11:49 AM  Findings Coronary Findings Diagnostic  Dominance: Right  Left Anterior Descending Prox LAD lesion is 95% stenosed. The lesion is calcified. Prox LAD to Mid LAD lesion is 70% stenosed. Mid LAD lesion is 80% stenosed.  Right Coronary Artery Prox RCA-1 lesion is 95% stenosed. Prox RCA-2 lesion is 95% stenosed. The lesion is calcified.  Intervention  Prox LAD lesion Stent (Also treats lesions: Prox LAD to Mid LAD) A drug-eluting stent was successfully placed. Stent strut is well apposed. Post-stent angioplasty was performed. Post-Intervention Lesion Assessment The intervention was successful. Pre-interventional TIMI flow is 3. Post-intervention TIMI flow is 3. No complications occurred at this lesion. There is a 0% residual stenosis post intervention.  Prox LAD to Mid LAD lesion Stent  (Also treats lesions: Prox LAD) See details in Prox LAD lesion. Post-Intervention Lesion Assessment The intervention was successful. Pre-interventional TIMI flow is 3. Post-intervention TIMI flow is 3. No complications occurred at this lesion. There is a 0% residual stenosis post intervention.  Mid LAD lesion Stent A drug-eluting stent was successfully placed. Post-Intervention Lesion Assessment The intervention was successful. Pre-interventional TIMI flow is 3. Post-intervention TIMI flow is 3. No complications occurred at this lesion. There is a 0% residual stenosis post intervention.  Prox RCA-1 lesion Stent (Also treats lesions: Prox RCA-2) A  drug-eluting stent was successfully placed using a STENT RESOLUTE ONYX 2.75X26. Stent strut is well apposed. Stent does not overlap previously placed stentPost-stent angioplasty was performed. Post-Intervention Lesion Assessment The intervention was successful. Pre-interventional TIMI flow is 3. Post-intervention TIMI flow is 3. No complications occurred at this lesion. There is a 0% residual stenosis post intervention.  Prox RCA-2 lesion Stent (Also treats lesions: Prox RCA-1) See details in Prox RCA-1 lesion. Post-Intervention Lesion Assessment The intervention was successful. Pre-interventional TIMI flow is 3. Post-intervention TIMI flow is 3. No complications occurred at this lesion. There is a 0% residual stenosis post intervention.   CARDIAC CATHETERIZATION  CARDIAC CATHETERIZATION 08/12/2019  Narrative Images from the original result were not included.   Prox RCA-1 lesion is 95% stenosed.  Prox RCA-2 lesion is 95% stenosed.  Mid RCA lesion is 30% stenosed.  Prox LAD lesion is 90% stenosed.  Mid LAD to Dist LAD lesion is 90% stenosed.  The left ventricular systolic function is normal.  LV end diastolic pressure is normal.  The left ventricular ejection fraction is 55-65% by visual estimate.  Ronelle Stache is a 62 y.o.  female   [id number] LOCATION:  FACILITY: [facility] PHYSICIAN: [name], M.D. 1960-09-30   DATE OF PROCEDURE:  08/12/2019  DATE OF DISCHARGE:     CARDIAC CATHETERIZATION    History obtained from chart review.Sadako Kuchta is a 62 y.o.   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  She is referred by her podiatrist, Dr. Ernestene Kiel, for peripheral vascular valuation because of an ulcer on her right first metatarsal head.  I last saw her in the office 06/07/2019. Her cardiac risk factors are notable for treated hypertension, diabetes and hyperlipidemia.  Unfortunately her hemoglobin A1c's have remained above 8.  She does have a family history for heart disease in his mother who had CABG.  Her mother, Famie Hearst, was a patient of mine as well who died in May 05, 2018.  She is never had a heart attack or stroke.  She denies chest pain or shortness of breath, but does sometimes have burning in her chest which she is attributed to reflux.  She does not exercise much but denies claudication as well.  Her wound on her right great toe has since healed.  Her Doppler studies performed 06/06/2019 revealed a right ABI 0.75 and a left of 0.82 with a high-frequency signal in her distal right SFA as well as tibial vessel disease.  She does complain now of right calf claudication.  Because of her complaints of chest pain I performed a coronary CTA with FFR analysis 08/01/2019 suggesting at least two-vessel disease.  Based on this I recommend that we proceed with outpatient diagnostic coronary angiography.  I have asked her to begin taking a baby aspirin.  Impression Ms. Ferrini has severe two-vessel disease with tandem lesions in her proximal RCA that were calcified and her proximal LAD and mid to distal LAD as well.  She has normal LV function.  These correlate exactly to the findings and coronary CTA.  She will need staged  orbital atherectomy of her RCA and LAD.  The sheath was removed and a TR band was placed on the right wrist to achieve patent hemostasis.  The patient left lab in stable condition.  She will be discharged home today with follow-up with me in the office tomorrow with the intent to schedule her atherectomy this coming  Thursday.  I did load her with Plavix today as well.  [name]. MD, [facility] 08/12/2019 12:50 PM  Findings Coronary Findings Diagnostic  Dominance: Right  Left Anterior Descending Prox LAD lesion is 90% stenosed. The lesion is calcified. Mid LAD to Dist LAD lesion is 90% stenosed.  Right Coronary Artery Prox RCA-1 lesion is 95% stenosed. The lesion is calcified. Prox RCA-2 lesion is 95% stenosed. The lesion is calcified. Mid RCA lesion is 30% stenosed.  Intervention  No interventions have been documented.       CT SCANS  CT CORONARY FRACTIONAL FLOW RESERVE DATA PREP 08/01/2019  Narrative EXAM: CT FFR ANALYSIS  CLINICAL DATA:  Chest pain  FINDINGS: FFRct analysis was performed on the original cardiac CT angiogram dataset. Diagrammatic representation of the FFRct analysis is provided in a separate PDF document in PACS. This dictation was created using the PDF document and an interactive 3D model of the results. 3D model is not available in the EMR/PACS. Normal FFR range is >0.80.  1. Left Main: 0.99; no significant stenosis.  2. Proximal LAD: 0.73; significant stenosis. 3. LCX: 0.81; no significant stenosis. 4. OM1: 0.90; no significant stenosis. 5. Proximal RCA: <0.50; significant stenosis.  IMPRESSION: 1. 2-vessel obstructive CAD including proximal LAD and proximal RCA. Cardiac catheterization is recommended.  Lennie Odor, MD   Electronically Signed By: Lennie Odor On: 08/02/2019 07:22   CT CORONARY MORPH W/CTA COR W/SCORE 08/01/2019  Addendum 08/01/2019  5:47 PM ADDENDUM REPORT: 08/01/2019 17:45  CLINICAL DATA:  Chest  pain  EXAM: Cardiac/Coronary CTA  TECHNIQUE: The patient was scanned on a [employer]. A 100 kV prospective scan was triggered in the descending thoracic aorta at 111 HU's. Axial non-contrast 3 mm slices were carried out through the heart. The data set was analyzed on a dedicated work station and scored using the Agatson method. Gantry rotation speed was 250 msecs and collimation was .6 mm. No beta blockade and 0.8 mg of sl NTG was given. The 3D data set was reconstructed in 5% intervals of the 35-75 % of the R-R cycle. Diastolic phases were analyzed on a dedicated work station using MPR, MIP and VRT modes. The patient received 80 cc of contrast.  FINDINGS: Image quality: Average.  Noise artifact is: Limited.  Coronary Arteries:  Normal coronary origin.  Right dominance.  Left main: The left main is a large caliber vessel with a normal take off from the left coronary cusp that bifurcates to form a left anterior descending artery and a left circumflex artery. There is minimal calcified plaque (<25%).  Left anterior descending artery: The proximal LAD contains moderate mixed density plaque (50-69%). The mid LAD contains moderate mixed density plaque (50-69%). 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 This segment is not interpretable.  Left circumflex artery: The LCX is non-dominant. The proximal LCX contains mild calcified plaque (25-49%). The mid to distal LCX contains mild non-calcified plaque (25-49%). OM1 is diffusely diseased with calcified plaque of indeterminate stenosis. The LCX terminates as a small OM2 branch with mild non-calcified plaque (25-49%).  Right coronary artery: The RCA is dominant with normal take off from the right coronary cusp. There is concerns for a severe stenosis of mixed density plaque in the proximal RCA (70-99%). The mid and distal RCA segments contain mild calcified plaque (25-49%). The RCA terminates as a  PDA with mild calcified plaque (25-49%).  Right Atrium: Right atrial size is within normal limits.  Right  Ventricle: The right ventricular cavity is within normal limits.  Left Atrium: Left atrial size is normal in size with no left atrial appendage filling defect. A small PFO is present.  Left Ventricle: The ventricular cavity size is within normal limits. There are no stigmata of prior infarction. There is no abnormal filling defect.  Pulmonary arteries: Normal in size without proximal filling defect.  Pulmonary veins: Normal pulmonary venous drainage.  Pericardium: Normal thickness with no significant effusion or calcium present.  Cardiac valves: The aortic valve is trileaflet with trace calcifications. The mitral valve is normal structure without significant calcification.  Aorta: Normal caliber with no significant disease.  Extra-cardiac findings: See attached radiology report for non-cardiac structures.  IMPRESSION: 1. Coronary calcium score of 1103. This was 99th percentile for age and sex matched control.  2. Normal coronary origin with right dominance.  3. 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  4. Concerns for moderate stenoses in the proximal and mid LAD (50-69%).  5. Extensive bridging of the mid to distal LAD that is not interpretable.  6. Small PFO.  RECOMMENDATIONS: 1. Cardiac catheterization is recommended. CT FFR will be submitted for the LAD.  Lennie Odor, MD   Electronically Signed By: Lennie Odor On: 08/01/2019 17:45  Narrative EXAM: OVER-READ INTERPRETATION  CT CHEST  The following report is an over-read performed by radiologist Dr. Trudie Reed of [facility] on 08/01/2019. This over-read does not include interpretation of cardiac or coronary anatomy or pathology. The coronary calcium score/coronary CTA interpretation by the cardiologist is attached.  COMPARISON:  None.  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 Within the visualized portions of the thorax there are no suspicious appearing pulmonary nodules or masses, there is no acute consolidative airspace disease, no pleural effusions, no pneumothorax and no lymphadenopathy. Visualized portions of the upper abdomen are unremarkable. There are no aggressive appearing lytic or blastic lesions noted in the visualized portions of the skeleton.  IMPRESSION: 1. There appears to be a nonocclusive likely chronic thrombus in a segmental sized branch to the anterobasal segment of the left lower lobe.  Electronically Signed: By: Trudie Reed M.D. On: 08/01/2019 14:21   CT SCANS  CT CARDIAC SCORING (SELF PAY ONLY) 06/14/2019  Addendum 06/14/2019  2:06 PM ADDENDUM REPORT: 06/14/2019 14:04  CLINICAL DATA:  Risk stratification  EXAM: Coronary Calcium Score  TECHNIQUE: The patient was scanned on a [employer] scanner. Axial non-contrast 3 mm slices were carried out through the heart. The data set was analyzed on a dedicated work station and scored using the Agatson method.  FINDINGS: Non-cardiac: See separate report from [facility].  Ascending Aorta: Atherosclerosis.  Aortic leaflet calcification.  Pericardium: Normal  Coronary arteries: Normal origins. Heavy multivessel coronary calcium.  IMPRESSION: Coronary calcium score of 1138. This was 99th percentile for age and sex matched control.  Aggressive risk factor modification and further coronary evaluation is recommended.   Electronically Signed By: Chrystie Nose M.D. On: 06/14/2019 14:04  Narrative EXAM: OVER-READ INTERPRETATION  CT CHEST  The following report is an over-read performed by radiologist Dr. Jeronimo Greaves of [facility]  on 06/14/2019. This over-read does not include interpretation of cardiac or coronary anatomy or pathology. The calcium score interpretation by the cardiologist is attached.  COMPARISON:  None.  FINDINGS: Vascular: Normal  aortic caliber.  Aortic atherosclerosis.  Mediastinum/Nodes: No imaged thoracic adenopathy. Mild esophageal dilatation with fluid level within. Example 7/4.  Lungs/Pleura: No pleural fluid. Clear imaged lungs.  Upper Abdomen: Moderate hepatic steatosis.  Musculoskeletal: No acute osseous abnormality.  IMPRESSION: 1.  No acute findings in the imaged extracardiac chest. 2.  Aortic Atherosclerosis (ICD10-I70.0). 3. Esophageal air fluid level suggests dysmotility or gastroesophageal reflux. 4. Hepatic steatosis.  Electronically Signed: By: Jeronimo Greaves M.D. On: 06/14/2019 13:26          Risk Assessment/Calculations:            Physical Exam:   VS:  BP (!) 143/67   Pulse 69   Ht 5\' 6"  (1.676 m)   Wt 261 lb 6.4 oz (118.6 kg)   LMP 12/31/2012   SpO2 100%   BMI 42.19 kg/m    Wt Readings from Last 3 Encounters:  01/13/23 261 lb 6.4 oz (118.6 kg)  09/15/22 257 lb 9.6 oz (116.8 kg)  06/15/22 260 lb (117.9 kg)    GEN: Well nourished, well developed in no acute distress NECK: No JVD; No carotid bruits CARDIAC: RRR, no murmurs, rubs, gallops RESPIRATORY:  Clear to auscultation without rales, wheezing or rhonchi  ABDOMEN: Soft, non-tender, non-distended EXTREMITIES:  No edema; No deformity   ASSESSMENT AND PLAN: .    Peripheral Arterial Disease (PAD) History of right SFA stent placement in 2022 with improvement in ABI and claudication symptoms. Recent ABI and ultrasound show progression of stenosis proximal to the right SFA stent. Patient reports intermittent right hip pain and calf pain with long distance walking, but can still walk 3/4 mile without issue. Symptoms improve with continued walking. -Continue current management with lifestyle modifications  and walking exercise. -Monitor for worsening claudication symptoms and consider repeat procedure if symptoms become lifestyle-limiting.  CAD: Denies any recent chest pain.  Hyperlipidemia LDL slightly above goal for patients with significant vascular disease (LDL 67, goal <55). Patient is currently on atorvastatin 80mg . -Continue atorvastatin 80mg . -Encourage dietary modifications. -Repeat cholesterol panel in May 2025.  Hypertension Single elevated blood pressure reading during visit (140/57), but patient's blood pressure has been normal in previous visits. -Continue current antihypertensive regimen. -Advise patient to monitor blood pressure at home and contact office if systolic blood pressure is consistently over 140.        Dispo: Follow-up with Dr. Allyson Sabal in 6 months  Signed, Azalee Course, [place]

## 2023-01-13 NOTE — Patient Instructions (Signed)
Medication Instructions:  NO CHANGES *If you need a refill on your cardiac medications before your next appointment, please call your pharmacy*   Lab Work: NO LABS If you have labs (blood work) drawn today and your tests are completely normal, you will receive your results only by: MyChart Message (if you have MyChart) OR A paper copy in the mail If you have any lab test that is abnormal or we need to change your treatment, we will call you to review the results.   Testing/Procedures: NO TESTING   Follow-Up: At [facility], you and your health needs are our priority.  As part of our continuing mission to provide you with exceptional heart care, we have created designated Provider Care Teams.  These Care Teams include your primary Cardiologist (physician) and Advanced Practice Providers (APPs -  Physician Assistants and Nurse Practitioners) who all work together to provide you with the care you need, when you need it.   Your next appointment:   6 month(s)  Provider:   [name], MD

## 2023-04-06 ENCOUNTER — Other Ambulatory Visit: Payer: Self-pay | Admitting: Physician Assistant

## 2023-06-15 ENCOUNTER — Encounter: Payer: Self-pay | Admitting: Cardiovascular Disease

## 2023-07-04 ENCOUNTER — Ambulatory Visit: Admitting: Podiatry

## 2023-07-10 ENCOUNTER — Encounter: Payer: Self-pay | Admitting: Podiatry

## 2023-07-10 ENCOUNTER — Ambulatory Visit (INDEPENDENT_AMBULATORY_CARE_PROVIDER_SITE_OTHER): Admitting: Podiatry

## 2023-07-10 DIAGNOSIS — E1151 Type 2 diabetes mellitus with diabetic peripheral angiopathy without gangrene: Secondary | ICD-10-CM

## 2023-07-10 DIAGNOSIS — L84 Corns and callosities: Secondary | ICD-10-CM | POA: Diagnosis not present

## 2023-07-10 DIAGNOSIS — M21619 Bunion of unspecified foot: Secondary | ICD-10-CM

## 2023-07-10 DIAGNOSIS — M79609 Pain in unspecified limb: Secondary | ICD-10-CM | POA: Diagnosis not present

## 2023-07-10 DIAGNOSIS — B351 Tinea unguium: Secondary | ICD-10-CM

## 2023-07-10 NOTE — Patient Instructions (Signed)

## 2023-07-10 NOTE — Progress Notes (Unsigned)
 Subjective: Chief Complaint  Patient presents with   [facility]    RM#20 DFC    63 year old female presents outside above concerns.  She states that she needs her nails trimmed as she has difficulty doing them.  No swelling or redness or any drainage.  No open lesions that she reports.  She also has calluses feet.  She also feels like the bunion on the right side is moving more.   No increased leg pain. She sess cardiology for PAD.   A1c 7.8 on 11/23/2022 Last BS today was 103  Ransom Byers, MD last seen 02/09/2023    Objective: AAO x3, NAD DP/PT pulses  1/4, CRT less than 3 seconds Sensation decreased with SWMF bilateral forefoot.  Nails are hypertrophic, dystrophic, brittle, discolored, elongated 10. No surrounding redness or drainage. Tenderness nails 1-5 bilaterally.  Hyperkeratotic lesion noted to the right foot submetatarsal 1 as well as the left third digit without any underlying ulceration, drainage or signs of infection.  No open lesions. Bunion present bilaterally without any pain.  No areas of pinpoint tenderness. No pain with calf compression, swelling, warmth, erythema  Assessment: Symptomatic onychosis, hyperkeratotic lesions, PAD; bunion  Plan: Bunion - Offered x-rays today but is not causing any pain.  Continue shoe modifications, offloading.  Symptomatic onychomycosis -Sharply debrided nails x 10 without any complications or bleeding  Hyperkeratotic lesions -Sharply debrided x 2 without any complications or bleeding.  Charity Conch DPM

## 2023-09-14 ENCOUNTER — Other Ambulatory Visit (HOSPITAL_BASED_OUTPATIENT_CLINIC_OR_DEPARTMENT_OTHER): Payer: Self-pay | Admitting: Family Medicine

## 2023-09-14 DIAGNOSIS — E2839 Other primary ovarian failure: Secondary | ICD-10-CM

## 2023-09-26 ENCOUNTER — Other Ambulatory Visit: Payer: Self-pay | Admitting: Cardiovascular Disease

## 2023-09-26 DIAGNOSIS — E782 Mixed hyperlipidemia: Secondary | ICD-10-CM

## 2023-10-09 ENCOUNTER — Ambulatory Visit: Attending: Cardiovascular Disease | Admitting: Cardiovascular Disease

## 2023-10-09 ENCOUNTER — Encounter: Payer: Self-pay | Admitting: Cardiovascular Disease

## 2023-10-09 VITALS — BP 118/70 | HR 75 | Ht 66.0 in | Wt 258.0 lb

## 2023-10-09 DIAGNOSIS — I739 Peripheral vascular disease, unspecified: Secondary | ICD-10-CM | POA: Insufficient documentation

## 2023-10-09 DIAGNOSIS — R0989 Other specified symptoms and signs involving the circulatory and respiratory systems: Secondary | ICD-10-CM | POA: Diagnosis present

## 2023-10-09 DIAGNOSIS — E782 Mixed hyperlipidemia: Secondary | ICD-10-CM | POA: Diagnosis not present

## 2023-10-09 DIAGNOSIS — I10 Essential (primary) hypertension: Secondary | ICD-10-CM | POA: Insufficient documentation

## 2023-10-09 NOTE — Assessment & Plan Note (Signed)
 History of essential hypertension blood pressure measured today at 118/70.  She is on losartan.

## 2023-10-09 NOTE — Assessment & Plan Note (Signed)
 History of PAD status post right SFA intervention by myself 06/01/2020 with directional arthrectomy and DCB followed by Innova stenting.  Her ABI increased from 0.48-0.94 and her claudication resolved at that time.  Her most recent Doppler study performed 12//24 reveals a right ABI of 0.72.  She does have a high-frequency signal in her proximal right SFA stent although she really denies claudication similar to preintervention.  Will recheck lower extremity Dopplers this coming December.

## 2023-10-09 NOTE — Assessment & Plan Note (Addendum)
 History of hyperlipidemia on high-dose atorvastatin  and Zetia  with lipid profile performed 09/11/2023 revealing total cholesterol 130, LDL of 66 and HDL 50.

## 2023-10-09 NOTE — Patient Instructions (Signed)
 Medication Instructions:  Your physician recommends that you continue on your current medications as directed. Please refer to the Current Medication list given to you today.  *If you need a refill on your cardiac medications before your next appointment, please call your pharmacy*  Testing/Procedures: Your physician has requested that you have an ankle brachial index (ABI) in December. During this test an ultrasound and blood pressure cuff are used to evaluate the arteries that supply the arms and legs with blood. Allow thirty minutes for this exam. There are no restrictions or special instructions.  Please note: We ask at that you not bring children with you during ultrasound (echo/ vascular) testing. Due to room size and safety concerns, children are not allowed in the ultrasound rooms during exams. Our front office staff cannot provide observation of children in our lobby area while testing is being conducted. An adult accompanying a patient to their appointment will only be allowed in the ultrasound room at the discretion of the ultrasound technician under special circumstances. We apologize for any inconvenience.  Your physician has requested that you have a carotid duplex. This test is an ultrasound of the carotid arteries in your neck. It looks at blood flow through these arteries that supply the brain with blood. Allow one hour for this exam. There are no restrictions or special instructions.   Follow-Up: At [facility], you and your health needs are our priority.  As part of our continuing mission to provide you with exceptional heart care, our providers are all part of one team.  This team includes your primary Cardiologist (physician) and Advanced Practice Providers or APPs (Physician Assistants and Nurse Practitioners) who all work together to provide you with the care you need, when you need it.  Your next appointment:   1 year(s)  Provider:   Dorn Lesches, MD

## 2023-10-09 NOTE — Progress Notes (Signed)
 10/09/2023 Monica Daniels   1960-10-12  [id number]  Primary Physician [name], MD Primary Cardiologist: Dorn JINNY Lesches MD [place], [place]  HPI:  Monica Daniels is a 63 y.o.  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  She is referred by her podiatrist, Dr. Royden Cedar, for peripheral vascular evaluation because of an ulcer on her right first metatarsal head.  I last saw her in the office 06/12/2020.  Her cardiac risk factors are notable for treated hypertension, diabetes and hyperlipidemia.  Unfortunately her hemoglobin A1c's have remained above 8.  She does have a family history for heart disease in his mother who had CABG.  Her mother, Monica Daniels, was a patient of mine as well who died in 04-28-2018.  She is never had a heart attack or stroke.  She denies chest pain or shortness of breath, but does sometimes have burning in her chest which she is attributed to reflux.  She does not exercise much but denies claudication as well.   Her wound on her right great toe has since healed.  Her Doppler studies performed 06/06/2019 revealed a right ABI 0.75 and a left of 0.82 with a high-frequency signal in her distal right SFA as well as tibial vessel disease.  She does complain now of right calf claudication.  Because of her complaints of chest pain I performed a coronary CTA with FFR analysis 08/01/2019 suggesting at least two-vessel disease.  Based on this I recommend that we proceed with outpatient diagnostic coronary angiography.  I have asked her to begin taking a baby as   Outpatient cardiac cath on 08/12/2019 revealing severe proximal RCA, proximal mid LAD diseasepirin..  3 days later after performed outpatient RCA stenting, proximal LAD orbital atherectomy, PCI and stenting as well as stenting of her mid LAD.  She is done well since then feels clinically improved.  She denies chest pain  or shortness of breath.   She was participating cardiac rehab.  She has noticed lifestyle limiting right calf claudication.  Recent Doppler studies performed 02/27/2020 revealed a right ABI of 0.48 and a left of 0.84 with a high-frequency signal in her mid right SFA.    I performed peripheral angiography 06/01/2020 revealing a 95% segmental mid right SFA stenosis.  I performed directional atherectomy, drug-coated balloon angioplasty and a nova self-expanding stenting with excellent result.  She had two-vessel runoff.  Her ABI did increase from 0.48 up to 0.94.  Her claudication resolved after that.  Since I saw her 3-1/2 years ago she continues to do well.  She is fairly active.  She denies chest pain or shortness of breath.  She does have mild claudication but not as severe as preintervention.  Her lower extremity arterial Doppler studies performed 12//24 revealed a right ABI of 0.72 with a high-frequency signal in the proximal portion of her right SFA stent.   Current Meds  Medication Sig   aspirin  81 MG EC tablet TAKE 1 TABLET BY MOUTH DAILY. SWALLOW WHOLE. (Patient taking differently: Take 81 mg by mouth daily.)   atorvastatin  (LIPITOR) 80 MG tablet TAKE 1 TABLET BY MOUTH EVERYDAY AT BEDTIME   B-D UF III MINI PEN NEEDLES 31G X 5 MM MISC USE TO INJECT LANTUS DAILY AS DIRECTED   clopidogrel  (PLAVIX ) 75 MG tablet TAKE 1 TABLET BY MOUTH EVERY DAY   cyanocobalamin  1000 MCG tablet Take 1,000  mcg by mouth daily with supper.   ezetimibe  (ZETIA ) 10 MG tablet TAKE 1 TABLET BY MOUTH EVERY DAY   FARXIGA 10 MG TABS tablet Take 10 mg by mouth daily.   Insulin Glargine (BASAGLAR KWIKPEN [place]) Inject 14 Units into the skin at bedtime.   latanoprost (XALATAN) 0.005 % ophthalmic solution Place 1 drop into both eyes at bedtime.   loratadine (CLARITIN) 10 MG tablet Take 10 mg by mouth daily.   losartan (COZAAR) 50 MG tablet Take 50 mg by mouth daily.   metFORMIN (GLUCOPHAGE-XR) 500 MG 24 hr tablet Take 1,000 mg by  mouth 2 (two) times daily.   Multiple Vitamin (MULTIVITAMIN WITH MINERALS) TABS tablet Take 1 tablet by mouth at bedtime.   nitroGLYCERIN  (NITROSTAT ) 0.4 MG SL tablet Place 1 tablet (0.4 mg total) under the tongue every 5 (five) minutes as needed for chest pain.   ONETOUCH VERIO test strip 3 (three) times daily.   VITAMIN E PO Take 1 capsule by mouth daily at 6 (six) AM.   Current Facility-Administered Medications for the 10/09/23 encounter (Office Visit) with Court Dorn PARAS, MD  Medication   0.9 %  sodium chloride  infusion     Allergies  Allergen Reactions   Penicillins Anaphylaxis    Swelling Has patient had a PCN reaction causing immediate rash, facial/tongue/throat swelling, SOB or lightheadedness with hypotension: YES Has patient had a PCN reaction causing severe rash involving mucus membranes or skin necrosis: NO Has patient had a PCN reaction that required hospitalization NO Has patient had a PCN reaction occurring within the last 10 years: NO If all of the above answers are NO, then may proceed with Cephalosporin use.   Clindamycin /Lincomycin Rash    Social History   Socioeconomic History   Marital status: Single    Spouse name: Not on file   Number of children: 0   Years of education: Not on file   Highest education level: Not on file  Occupational History   Occupation: Retired  Tobacco Use   Smoking status: Never   Smokeless tobacco: Never  Vaping Use   Vaping status: Never Used  Substance and Sexual Activity   Alcohol use: No   Drug use: No   Sexual activity: Not Currently  Other Topics Concern   Not on file  Social History Narrative   Not on file   Social Drivers of Health   Financial Resource Strain: Not on file  Food Insecurity: Not on file  Transportation Needs: Not on file  Physical Activity: Not on file  Stress: Not on file  Social Connections: Not on file  Intimate Partner Violence: Not on file     Review of Systems: General: negative  for chills, fever, night sweats or weight changes.  Cardiovascular: negative for chest pain, dyspnea on exertion, edema, orthopnea, palpitations, paroxysmal nocturnal dyspnea or shortness of breath Dermatological: negative for rash Respiratory: negative for cough or wheezing Urologic: negative for hematuria Abdominal: negative for nausea, vomiting, diarrhea, bright red blood per rectum, melena, or hematemesis Neurologic: negative for visual changes, syncope, or dizziness All other systems reviewed and are otherwise negative except as noted above.    Blood pressure 118/70, pulse 75, height 5' 6 (1.676 m), weight 258 lb (117 kg), last menstrual period 12/31/2012, SpO2 97%.  General appearance: alert and no distress Neck: no adenopathy, no JVD, supple, symmetrical, trachea midline, thyroid  not enlarged, symmetric, no tenderness/mass/nodules, and soft high-frequency left carotid bruit Lungs: clear to auscultation bilaterally Heart: regular rate and  rhythm, S1, S2 normal, no murmur, click, rub or gallop Extremities: extremities normal, atraumatic, no cyanosis or edema Pulses: 2+ and symmetric Skin: Skin color, texture, turgor normal. No rashes or lesions Neurologic: Grossly normal  EKG EKG Interpretation Date/Time:  Monday October 09 2023 09:03:33 EDT Ventricular Rate:  75 PR Interval:  132 QRS Duration:  74 QT Interval:  368 QTC Calculation: 410 R Axis:   -7  Text Interpretation: Normal sinus rhythm Nonspecific T wave abnormality When compared with ECG of 27-Aug-2021 13:02, Non-specific change in ST segment in Anterior leads Confirmed by Court Carrier [phone]) on 10/09/2023 9:15:59 AM    ASSESSMENT AND PLAN:   HLD (hyperlipidemia) History of hyperlipidemia on high-dose atorvastatin  and Zetia  with lipid profile performed 09/11/2023 revealing total cholesterol 130, LDL of 66 and HDL 50.  Essential hypertension History of essential hypertension blood pressure measured today at  118/70.  She is on losartan.  Peripheral arterial disease (HCC) History of PAD status post right SFA intervention by myself 06/01/2020 with directional arthrectomy and DCB followed by Innova stenting.  Her ABI increased from 0.48-0.94 and her claudication resolved at that time.  Her most recent Doppler study performed 12//24 reveals a right ABI of 0.72.  She does have a high-frequency signal in her proximal right SFA stent although she really denies claudication similar to preintervention.  Will recheck lower extremity Dopplers this coming December.     Carrier DOROTHA Court MD FACP,FACC,FAHA, [facility] 10/09/2023 9:24 AM

## 2023-10-10 NOTE — Addendum Note (Signed)
 Addended by: LORRENE FEDERICO CROME on: 10/10/2023 02:50 PM   Modules accepted: Orders

## 2023-10-16 ENCOUNTER — Ambulatory Visit (INDEPENDENT_AMBULATORY_CARE_PROVIDER_SITE_OTHER)

## 2023-10-16 ENCOUNTER — Ambulatory Visit: Admitting: Podiatry

## 2023-10-16 DIAGNOSIS — M79676 Pain in unspecified toe(s): Secondary | ICD-10-CM

## 2023-10-16 DIAGNOSIS — M2012 Hallux valgus (acquired), left foot: Secondary | ICD-10-CM

## 2023-10-16 DIAGNOSIS — B351 Tinea unguium: Secondary | ICD-10-CM

## 2023-10-16 DIAGNOSIS — L84 Corns and callosities: Secondary | ICD-10-CM

## 2023-10-16 DIAGNOSIS — M2011 Hallux valgus (acquired), right foot: Secondary | ICD-10-CM

## 2023-10-16 DIAGNOSIS — E1142 Type 2 diabetes mellitus with diabetic polyneuropathy: Secondary | ICD-10-CM | POA: Diagnosis not present

## 2023-10-16 DIAGNOSIS — I739 Peripheral vascular disease, unspecified: Secondary | ICD-10-CM

## 2023-10-16 NOTE — Progress Notes (Signed)
 Subjective: Chief Complaint  Patient presents with   [facility]    Pt stated that she is doing well she is here to get her nails trimmed    63 year old female presents outside above concerns.  She states that she needs her nails trimmed as she has difficulty doing them.  No swelling or redness or any drainage.  No open lesions that she reports.  She also has calluses feet.  She also feels like the bunion on the right side is moving more.   No increased leg pain. She sess cardiology for PAD.   A1c 7.8 on 11/23/2022 Last BS 128  [name], MD last seen 02/09/2023    Objective: AAO x3, NAD DP/PT pulses  1/4, CRT less than 3 seconds Sensation decreased with SWMF bilateral forefoot.  Nails are hypertrophic, dystrophic, brittle, discolored, elongated 10. No surrounding redness or drainage. Tenderness nails 1-5 bilaterally.  Hyperkeratotic lesion noted to the left foot submetatarsal 1 as well as the bilateral sub 5th MTP any underlying ulceration, drainage or signs of infection.  No open lesions. Bunion present bilaterally without any pain.  No areas of pinpoint tenderness. No pain with calf compression, swelling, warmth, erythema  Assessment: Pain due to onychomycosis of nail  Polyneuropathy due to type 2 diabetes mellitus (HCC)  Hallux abducto valgus, bilateral  Peripheral arterial disease (HCC)  Corns and callosities   Plan: Bunion - Offered x-rays today but is not causing any pain.  Continue shoe modifications, offloading.   Symptomatic onychomycosis -Sharply debrided nails x 10 without any complications or bleeding  Hyperkeratotic lesions -Sharply debrided x 3. Mild bleeding to sub metatarsal 1 callus after debridement. Dressed with bandaid and antibiotic ointment. Discussed she may need to replace this dressing once.   RTC with Dr. Gershon in 3 months  Prentice PARAS Shah Insley DPM

## 2023-10-31 ENCOUNTER — Other Ambulatory Visit (HOSPITAL_BASED_OUTPATIENT_CLINIC_OR_DEPARTMENT_OTHER)

## 2023-11-06 ENCOUNTER — Ambulatory Visit (HOSPITAL_BASED_OUTPATIENT_CLINIC_OR_DEPARTMENT_OTHER)
Admission: RE | Admit: 2023-11-06 | Discharge: 2023-11-06 | Disposition: A | Discharge status: Home / Self Care | Source: Ambulatory Visit | Attending: Cardiovascular Disease | Admitting: Cardiovascular Disease

## 2023-11-06 ENCOUNTER — Ambulatory Visit (HOSPITAL_COMMUNITY)
Admission: RE | Admit: 2023-11-06 | Discharge: 2023-11-06 | Disposition: A | Discharge status: Home / Self Care | Source: Ambulatory Visit | Attending: Cardiovascular Disease | Admitting: Cardiovascular Disease

## 2023-11-06 ENCOUNTER — Ambulatory Visit: Payer: Self-pay | Admitting: Cardiovascular Disease

## 2023-11-06 DIAGNOSIS — R0989 Other specified symptoms and signs involving the circulatory and respiratory systems: Secondary | ICD-10-CM | POA: Insufficient documentation

## 2023-11-06 DIAGNOSIS — I739 Peripheral vascular disease, unspecified: Secondary | ICD-10-CM

## 2023-11-06 LAB — VAS US ABI WITH/WO TBI
Left ABI: 0.78
Right ABI: 0.55

## 2023-11-29 ENCOUNTER — Encounter: Payer: Self-pay | Admitting: Cardiovascular Disease

## 2023-11-29 ENCOUNTER — Ambulatory Visit: Attending: Cardiovascular Disease | Admitting: Cardiovascular Disease

## 2023-11-29 VITALS — BP 130/56 | HR 86 | Ht 66.0 in | Wt 258.8 lb

## 2023-11-29 DIAGNOSIS — Z9862 Peripheral vascular angioplasty status: Secondary | ICD-10-CM | POA: Diagnosis present

## 2023-11-29 DIAGNOSIS — I739 Peripheral vascular disease, unspecified: Secondary | ICD-10-CM | POA: Insufficient documentation

## 2023-11-29 MED ORDER — NITROGLYCERIN 0.4 MG SL SUBL: 0.4000 mg | SUBLINGUAL_TABLET | SUBLINGUAL | 3 refills | Status: AC | PRN

## 2023-11-29 NOTE — Assessment & Plan Note (Signed)
 Monica Daniels returns today for follow-up of her PAD.  I performed right SFA intervention 06/01/2020 of a 95% segmental mid right SFA stenosis.  I performed directional arthrectomy, drug-coated balloon angioplasty followed by self-expanding stenting.  She had two-vessel runoff.  She had an excellent result at that time with an ABI and increase from 0.48-0.94.  Claudication resolved.  She has had some mild recurrent claudication which does not seem to be lifestyle-limiting.  Recent Doppler studies reveal moderate disease proximal to the previously placed stent with a decline in her right ABI to 0.55 from 0.72.  At this point, she does not feel that her symptoms warrant reintervention.  Will repeat visit this in 6 months.  Ultimately, I think she will require repeat angiography and endovascular therapy.

## 2023-11-29 NOTE — Patient Instructions (Signed)
 Medication Instructions:  Your physician recommends that you continue on your current medications as directed. Please refer to the Current Medication list given to you today.  *If you need a refill on your cardiac medications before your next appointment, please call your pharmacy*  Testing/Procedures: Your physician has requested that you have a lower extremity arterial duplex. During this test, ultrasound is used to evaluate arterial blood flow in the legs. Allow one hour for this exam. There are no restrictions or special instructions. This will take place at [address] 4th floor  **To do in May**  Please note: We ask at that you not bring children with you during ultrasound (echo/ vascular) testing. Due to room size and safety concerns, children are not allowed in the ultrasound rooms during exams. Our front office staff cannot provide observation of children in our lobby area while testing is being conducted. An adult accompanying a patient to their appointment will only be allowed in the ultrasound room at the discretion of the ultrasound technician under special circumstances. We apologize for any inconvenience.   Your physician has requested that you have an ankle brachial index (ABI). During this test an ultrasound and blood pressure cuff are used to evaluate the arteries that supply the arms and legs with blood. Allow thirty minutes for this exam. There are no restrictions or special instructions. This will take place at [address] 4th floor    **To do in May**   Please note: We ask at that you not bring children with you during ultrasound (echo/ vascular) testing. Due to room size and safety concerns, children are not allowed in the ultrasound rooms during exams. Our front office staff cannot provide observation of children in our lobby area while testing is being conducted. An adult accompanying a patient to their appointment will only be allowed in the ultrasound room at the  discretion of the ultrasound technician under special circumstances. We apologize for any inconvenience.   Follow-Up: At [facility], you and your health needs are our priority.  As part of our continuing mission to provide you with exceptional heart care, our providers are all part of one team.  This team includes your primary Cardiologist (physician) and Advanced Practice Providers or APPs (Physician Assistants and Nurse Practitioners) who all work together to provide you with the care you need, when you need it.  Your next appointment:   6 month(s) (post dopplers)  Provider:   Dorn Lesches, MD

## 2023-11-29 NOTE — Progress Notes (Signed)
 11/29/2023 Monica Daniels   [birth year]  [id number]  Primary Physician [name], MD Primary Cardiologist: Dorn JINNY Lesches MD [facility] [place], [place]  HPI:  Monica Daniels is a 63 y.o.   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  She is referred by her podiatrist, Dr. Royden Cedar, for peripheral vascular evaluation because of an ulcer on her right first metatarsal head.  I last saw her in the office 10/09/2023.  Her cardiac risk factors are notable for treated hypertension, diabetes and hyperlipidemia.  Unfortunately her hemoglobin A1c's have remained above 8.  She does have a family history for heart disease in his mother who had CABG.  Her mother, Monica Daniels, was a patient of mine as well who died in 04-23-2018.  She is never had a heart attack or stroke.  She denies chest pain or shortness of breath, but does sometimes have burning in her chest which she is attributed to reflux.  She does not exercise much but denies claudication as well.   Her wound on her right great toe has since healed.  Her Doppler studies performed 06/06/2019 revealed a right ABI 0.75 and a left of 0.82 with a high-frequency signal in her distal right SFA as well as tibial vessel disease.  She does complain now of right calf claudication.  Because of her complaints of chest pain I performed a coronary CTA with FFR analysis 08/01/2019 suggesting at least two-vessel disease.  Based on this I recommend that we proceed with outpatient diagnostic coronary angiography.  I have asked her to begin taking a baby as   Outpatient cardiac cath on 08/12/2019 revealing severe proximal RCA, proximal mid LAD diseasepirin..  3 days later after performed outpatient RCA stenting, proximal LAD orbital atherectomy, PCI and stenting as well as stenting of her mid LAD.  She is done well since then feels clinically improved.  She denies chest pain  or shortness of breath.   She was participating cardiac rehab.  She has noticed lifestyle limiting right calf claudication.  Recent Doppler studies performed 02/27/2020 revealed a right ABI of 0.48 and a left of 0.84 with a high-frequency signal in her mid right SFA.    I performed peripheral angiography 06/01/2020 revealing a 95% segmental mid right SFA stenosis.  I performed directional atherectomy, drug-coated balloon angioplasty and a nova self-expanding stenting with excellent result.  She had two-vessel runoff.  Her ABI did increase from 0.48 up to 0.94.  Her claudication resolved after that.   Since I saw her in the office 2 months ago she did have Doppler studies that showed a high-frequency signal just proximal to the previously placed stent in the right SFA with a decline in her right ABI to 0.55.  She says she has mild right lower extremity claudication which at this point does not appear to be lifestyle-limiting and does not wish to proceed with intervention at this time.  We will revisit this in 6 months.   Current Meds  Medication Sig   aspirin  81 MG EC tablet TAKE 1 TABLET BY MOUTH DAILY. SWALLOW WHOLE. (Patient taking differently: Take 81 mg by mouth daily.)   atorvastatin  (LIPITOR) 80 MG tablet TAKE 1 TABLET BY MOUTH EVERYDAY AT BEDTIME   B-D UF III MINI PEN NEEDLES 31G X 5 MM MISC USE TO INJECT LANTUS DAILY AS DIRECTED   clopidogrel  (PLAVIX ) 75 MG tablet  TAKE 1 TABLET BY MOUTH EVERY DAY   cyanocobalamin  1000 MCG tablet Take 1,000 mcg by mouth daily with supper.   ezetimibe  (ZETIA ) 10 MG tablet TAKE 1 TABLET BY MOUTH EVERY DAY   FARXIGA 10 MG TABS tablet Take 10 mg by mouth daily.   Insulin Glargine (BASAGLAR KWIKPEN [facility]) Inject 14 Units into the skin at bedtime.   latanoprost (XALATAN) 0.005 % ophthalmic solution Place 1 drop into both eyes at bedtime.   loratadine (CLARITIN) 10 MG tablet Take 10 mg by mouth daily.   losartan (COZAAR) 50 MG tablet Take 50 mg by mouth daily.    metFORMIN (GLUCOPHAGE-XR) 500 MG 24 hr tablet Take 1,000 mg by mouth 2 (two) times daily.   Multiple Vitamin (MULTIVITAMIN WITH MINERALS) TABS tablet Take 1 tablet by mouth at bedtime.   VITAMIN E PO Take 1 capsule by mouth daily at 6 (six) AM.   [DISCONTINUED] nitroGLYCERIN  (NITROSTAT ) 0.4 MG SL tablet Place 1 tablet (0.4 mg total) under the tongue every 5 (five) minutes as needed for chest pain.   Current Facility-Administered Medications for the 11/29/23 encounter (Office Visit) with Court Dorn PARAS, MD  Medication   0.9 %  sodium chloride  infusion     Allergies  Allergen Reactions   Penicillins Anaphylaxis    Swelling Has patient had a PCN reaction causing immediate rash, facial/tongue/throat swelling, SOB or lightheadedness with hypotension: YES Has patient had a PCN reaction causing severe rash involving mucus membranes or skin necrosis: NO Has patient had a PCN reaction that required hospitalization NO Has patient had a PCN reaction occurring within the last 10 years: NO If all of the above answers are NO, then may proceed with Cephalosporin use.   Clindamycin /Lincomycin Rash    Social History   Socioeconomic History   Marital status: Single    Spouse name: Not on file   Number of children: 0   Years of education: Not on file   Highest education level: Not on file  Occupational History   Occupation: Retired  Tobacco Use   Smoking status: Never   Smokeless tobacco: Never  Vaping Use   Vaping status: Never Used  Substance and Sexual Activity   Alcohol use: No   Drug use: No   Sexual activity: Not Currently  Other Topics Concern   Not on file  Social History Narrative   Not on file   Social Drivers of Health   Financial Resource Strain: Not on file  Food Insecurity: Not on file  Transportation Needs: Not on file  Physical Activity: Not on file  Stress: Not on file  Social Connections: Not on file  Intimate Partner Violence: Not on file     Review of  Systems: General: negative for chills, fever, night sweats or weight changes.  Cardiovascular: negative for chest pain, dyspnea on exertion, edema, orthopnea, palpitations, paroxysmal nocturnal dyspnea or shortness of breath Dermatological: negative for rash Respiratory: negative for cough or wheezing Urologic: negative for hematuria Abdominal: negative for nausea, vomiting, diarrhea, bright red blood per rectum, melena, or hematemesis Neurologic: negative for visual changes, syncope, or dizziness All other systems reviewed and are otherwise negative except as noted above.    Blood pressure (!) 130/56, pulse 86, height 5' 6 (1.676 m), weight 258 lb 12.8 oz (117.4 kg), last menstrual period 12/31/2012, SpO2 97%.  General appearance: alert and no distress Neck: no adenopathy, no carotid bruit, no JVD, supple, symmetrical, trachea midline, and thyroid  not enlarged, symmetric, no tenderness/mass/nodules Lungs: clear  to auscultation bilaterally Heart: regular rate and rhythm, S1, S2 normal, no murmur, click, rub or gallop Extremities: extremities normal, atraumatic, no cyanosis or edema Pulses: Decreased right pedal pulse Skin: Skin color, texture, turgor normal. No rashes or lesions Neurologic: Grossly normal  EKG not performed today      ASSESSMENT AND PLAN:   Peripheral arterial disease Ms. Jimmerson returns today for follow-up of her PAD.  I performed right SFA intervention 06/01/2020 of a 95% segmental mid right SFA stenosis.  I performed directional arthrectomy, drug-coated balloon angioplasty followed by self-expanding stenting.  She had two-vessel runoff.  She had an excellent result at that time with an ABI and increase from 0.48-0.94.  Claudication resolved.  She has had some mild recurrent claudication which does not seem to be lifestyle-limiting.  Recent Doppler studies reveal moderate disease proximal to the previously placed stent with a decline in her right ABI to 0.55 from 0.72.  At  this point, she does not feel that her symptoms warrant reintervention.  Will repeat visit this in 6 months.  Ultimately, I think she will require repeat angiography and endovascular therapy.     Dorn DOROTHA Lesches MD FACP,FACC,FAHA, [facility] 11/29/2023 1:48 PM

## 2023-12-04 ENCOUNTER — Ambulatory Visit (HOSPITAL_BASED_OUTPATIENT_CLINIC_OR_DEPARTMENT_OTHER)
Admission: RE | Admit: 2023-12-04 | Discharge: 2023-12-04 | Disposition: A | Discharge status: Home / Self Care | Source: Ambulatory Visit | Attending: Family Medicine | Admitting: Family Medicine

## 2023-12-04 DIAGNOSIS — E2839 Other primary ovarian failure: Secondary | ICD-10-CM | POA: Diagnosis present

## 2024-01-05 ENCOUNTER — Other Ambulatory Visit: Payer: Self-pay | Admitting: Cardiovascular Disease

## 2024-01-05 DIAGNOSIS — E782 Mixed hyperlipidemia: Secondary | ICD-10-CM

## 2024-01-15 ENCOUNTER — Ambulatory Visit (INDEPENDENT_AMBULATORY_CARE_PROVIDER_SITE_OTHER): Admitting: Podiatry

## 2024-01-15 DIAGNOSIS — L84 Corns and callosities: Secondary | ICD-10-CM | POA: Diagnosis not present

## 2024-01-15 DIAGNOSIS — I739 Peripheral vascular disease, unspecified: Secondary | ICD-10-CM

## 2024-01-15 DIAGNOSIS — E1142 Type 2 diabetes mellitus with diabetic polyneuropathy: Secondary | ICD-10-CM | POA: Diagnosis not present

## 2024-01-15 NOTE — Progress Notes (Signed)
 Subjective: Chief Complaint  Patient presents with   Diabetes   Callouses    Corns and callosities. IDDM A1C 7.9. 2 pain.    63 year old female presents outside above concerns.  She said the calluses on the right foot do cause discomfort as they become thick.  She does not report any drainage or any openings.   She sess cardiology for PAD.   A1c 7.8 on 11/23/2022  Monica Lamarr RAMAN, MD last seen September 11, 2023   Objective: AAO x3, NAD DP/PT pulses  1/4, CRT less than 3 seconds Sensation decreased with SWMF bilateral forefoot.  Nails are hypertrophic, dystrophic, brittle, discolored, elongated 10. No surrounding redness or drainage. Tenderness nails 1-5 bilaterally.  Hyperkeratotic lesion noted to the right foot submetatarsal 1 and submetatarsal 5 without any underlying ulceration, drainage or signs of infection.  No open lesions. Bunion present bilaterally without any pain.  No areas of pinpoint tenderness. No pain with calf compression, swelling, warmth, erythema  Assessment: Symptomatic onychosis, hyperkeratotic lesions, PAD; bunion  Plan:  Hyperkeratotic lesions -Sharply debrided x 2 without any complications or bleeding. - Daily foot inspection, moisturizer, offloading.  Monica Daniels DPM

## 2024-04-18 ENCOUNTER — Ambulatory Visit: Admitting: Podiatry

## 2024-05-28 ENCOUNTER — Ambulatory Visit (HOSPITAL_COMMUNITY)
